# Patient Record
Sex: Female | Born: 1995 | Hispanic: No | Smoking: Former smoker
Health system: Southern US, Community
[De-identification: ages and names within clinical notes are randomized; demographics above are authoritative.]

## PROBLEM LIST (undated history)

## (undated) ENCOUNTER — Emergency Department: Discharge status: Absent without leave | Payer: Self-pay

## (undated) DIAGNOSIS — O2303 Infections of kidney in pregnancy, third trimester: Secondary | ICD-10-CM

## (undated) DIAGNOSIS — J189 Pneumonia, unspecified organism: Secondary | ICD-10-CM

## (undated) DIAGNOSIS — F419 Anxiety disorder, unspecified: Secondary | ICD-10-CM

## (undated) DIAGNOSIS — G43909 Migraine, unspecified, not intractable, without status migrainosus: Secondary | ICD-10-CM

## (undated) DIAGNOSIS — F410 Panic disorder [episodic paroxysmal anxiety] without agoraphobia: Secondary | ICD-10-CM

## (undated) DIAGNOSIS — F909 Attention-deficit hyperactivity disorder, unspecified type: Secondary | ICD-10-CM

## (undated) DIAGNOSIS — D649 Anemia, unspecified: Secondary | ICD-10-CM

## (undated) DIAGNOSIS — F32A Depression, unspecified: Secondary | ICD-10-CM

## (undated) DIAGNOSIS — F329 Major depressive disorder, single episode, unspecified: Secondary | ICD-10-CM

## (undated) DIAGNOSIS — J45909 Unspecified asthma, uncomplicated: Secondary | ICD-10-CM

## (undated) DIAGNOSIS — Z789 Other specified health status: Secondary | ICD-10-CM

## (undated) HISTORY — PX: NO PAST SURGERIES: SHX2092

## (undated) HISTORY — DX: Anemia, unspecified: D64.9

## (undated) HISTORY — PX: STENT PLACE LEFT URETER (ARMC HX): HXRAD1254

---
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
## 2015-03-24 ENCOUNTER — Emergency Department
Admission: EM | Admit: 2015-03-24 | Discharge: 2015-03-24 | Disposition: A | Discharge status: Home / Self Care | Payer: Self-pay | Attending: Student | Admitting: Student

## 2015-03-24 DIAGNOSIS — F17200 Nicotine dependence, unspecified, uncomplicated: Secondary | ICD-10-CM | POA: Insufficient documentation

## 2015-03-24 DIAGNOSIS — B349 Viral infection, unspecified: Secondary | ICD-10-CM | POA: Insufficient documentation

## 2015-03-24 HISTORY — DX: Unspecified asthma, uncomplicated: J45.909

## 2015-03-24 MED ORDER — ONDANSETRON HCL 4 MG PO TABS: 4.0000 mg | ORAL_TABLET | Freq: Three times a day (TID) | ORAL | Status: DC | PRN

## 2015-03-24 MED ORDER — ALBUTEROL SULFATE (2.5 MG/3ML) 0.083% IN NEBU
2.5000 mg | INHALATION_SOLUTION | Freq: Once | RESPIRATORY_TRACT | Status: AC
  Administered 2015-03-24: 2.5 mg via RESPIRATORY_TRACT
  Filled 2015-03-24: qty 3

## 2015-03-24 MED ORDER — GUAIFENESIN-CODEINE 100-10 MG/5ML PO SOLN: 10.0000 mL | Freq: Three times a day (TID) | ORAL | Status: DC | PRN

## 2015-03-24 NOTE — Discharge Instructions (Signed)
Viral Infections °A viral infection can be caused by different types of viruses. Most viral infections are not serious and resolve on their own. However, some infections may cause severe symptoms and may lead to further complications. °SYMPTOMS °Viruses can frequently cause: °· Minor sore throat. °· Aches and pains. °· Headaches. °· Runny nose. °· Different types of rashes. °· Watery eyes. °· Tiredness. °· Cough. °· Loss of appetite. °· Gastrointestinal infections, resulting in nausea, vomiting, and diarrhea. °These symptoms do not respond to antibiotics because the infection is not caused by bacteria. However, you might catch a bacterial infection following the viral infection. This is sometimes called a "superinfection." Symptoms of such a bacterial infection may include: °· Worsening sore throat with pus and difficulty swallowing. °· Swollen neck glands. °· Chills and a high or persistent fever. °· Severe headache. °· Tenderness over the sinuses. °· Persistent overall ill feeling (malaise), muscle aches, and tiredness (fatigue). °· Persistent cough. °· Yellow, green, or brown mucus production with coughing. °HOME CARE INSTRUCTIONS  °· Only take over-the-counter or prescription medicines for pain, discomfort, diarrhea, or fever as directed by your caregiver. °· Drink enough water and fluids to keep your urine clear or pale yellow. Sports drinks can provide valuable electrolytes, sugars, and hydration. °· Get plenty of rest and maintain proper nutrition. Soups and broths with crackers or rice are fine. °SEEK IMMEDIATE MEDICAL CARE IF:  °· You have severe headaches, shortness of breath, chest pain, neck pain, or an unusual rash. °· You have uncontrolled vomiting, diarrhea, or you are unable to keep down fluids. °· You or your child has an oral temperature above 102° F (38.9° C), not controlled by medicine. °· Your baby is older than 3 months with a rectal temperature of 102° F (38.9° C) or higher. °· Your baby is 3  months old or younger with a rectal temperature of 100.4° F (38° C) or higher. °MAKE SURE YOU:  °· Understand these instructions. °· Will watch your condition. °· Will get help right away if you are not doing well or get worse. °  °This information is not intended to replace advice given to you by your health care provider. Make sure you discuss any questions you have with your health care provider. °  °Document Released: 01/13/2005 Document Revised: 06/28/2011 Document Reviewed: 09/11/2014 °Elsevier Interactive Patient Education ©2016 [employer] ° °

## 2015-03-24 NOTE — ED Notes (Addendum)
Patient comes in with nausea and vomiting for 2 days.  Patient reports that boyfriend just recently got over stomach virus and now patient having same symptoms. Patient also complains of congestion and pain in chest with coughing.

## 2015-03-24 NOTE — ED Notes (Signed)
Assessment per PA 

## 2015-03-24 NOTE — ED Provider Notes (Signed)
[facility] Emergency Department Provider Note ____________________________________________  Time seen: Approximately 3:10 PM  I have reviewed the triage vital signs and the nursing notes.   HISTORY  Chief Complaint GI Problem and Cough   HPI Suzanne Nelson is a 19 y.o. female who presents to the emergency department with nausea and vomiting for the past 2 days and cough. She states that her boyfriend has had similar symptoms. She denies fever. She states that she has been using her inhaler, but it has not been relieving her cough. She last vomited earlier today and has been able to tolerate fluids since. She denies diarrhea or abdominal pain.   Past Medical History  Diagnosis Date  . Asthma     There are no active problems to display for this patient.   History reviewed. No pertinent past surgical history.  Current Outpatient Rx  Name  Route  Sig  Dispense  Refill  . guaiFENesin-codeine 100-10 MG/5ML syrup   Oral   Take 10 mLs by mouth 3 (three) times daily as needed.   120 mL   0   . ondansetron (ZOFRAN) 4 MG tablet   Oral   Take 1 tablet (4 mg total) by mouth every 8 (eight) hours as needed for nausea or vomiting.   20 tablet   0     Allergies Review of patient's allergies indicates no known allergies.  History reviewed. No pertinent family history.  Social History Social History  Substance Use Topics  . Smoking status: Current Every Day Smoker  . Smokeless tobacco: None  . Alcohol Use: No    Review of Systems Constitutional: No fever/chills Eyes: No visual changes. ENT: No sore throat. Cardiovascular: Denies chest pain. Respiratory: Denies shortness of breath. Gastrointestinal: No abdominal pain.  Positive for nausea and vomiting.  No diarrhea.  No constipation. Genitourinary: Negative for dysuria. Musculoskeletal: Negative for back pain. Skin: Negative for rash. Neurological: Negative for headaches, focal weakness or  numbness.  10-point ROS otherwise negative.  ____________________________________________   PHYSICAL EXAM:  VITAL SIGNS: ED Triage Vitals  Enc Vitals Group     BP 03/24/15 1230 108/77 mmHg     Pulse Rate 03/24/15 1230 109     Resp 03/24/15 1230 18     Temp 03/24/15 1230 98.1 F (36.7 C)     Temp Source 03/24/15 1230 Oral     SpO2 03/24/15 1230 99 %     Weight 03/24/15 1230 160 lb (72.576 kg)     Height 03/24/15 1230  (1.575 m)     Head Cir --      Peak Flow --      Pain Score 03/24/15 1230 4     Pain Loc --      Pain Edu? --      Excl. in GC? --     Constitutional: Alert and oriented. Well appearing and in no acute distress. Eyes: Conjunctivae are normal. PERRL. EOMI. Head: Atraumatic. Nose: No congestion/rhinnorhea. Mouth/Throat: Mucous membranes are moist.  Oropharynx non-erythematous. Neck: No stridor.   Hematological/Lymphatic/Immunilogical: No cervical lymphadenopathy. Cardiovascular: Normal rate, regular rhythm. Grossly normal heart sounds.  Good peripheral circulation. Respiratory: Normal respiratory effort.  No retractions. Lungs CTAB. Gastrointestinal: Soft and nontender. No distention. No abdominal bruits. No CVA tenderness. Musculoskeletal: No lower extremity tenderness nor edema.  No joint effusions. Neurologic:  Normal speech and language. No gross focal neurologic deficits are appreciated. No gait instability. Skin:  Skin is warm, dry and intact. No rash  noted. Psychiatric: Mood and affect are normal. Speech and behavior are normal.  ____________________________________________   LABS (all labs ordered are listed, but only abnormal results are displayed)  Labs Reviewed - No data to display ____________________________________________  EKG   ____________________________________________  RADIOLOGY   ____________________________________________   PROCEDURES  Procedure(s) performed: None  Critical Care performed:  No  ____________________________________________   INITIAL IMPRESSION / ASSESSMENT AND PLAN / ED COURSE  Pertinent labs & imaging results that were available during my care of the patient were reviewed by me and considered in my medical decision making (see chart for details).  Patient was given prescription for Zofran and Robitussin-AC. She was advised to continue using her inhaler as prescribed. She was advised to follow-up with her primary care provider or return to the emergency department for symptoms that change or worsen if she is unable to schedule an appointment. ____________________________________________   FINAL CLINICAL IMPRESSION(S) / ED DIAGNOSES  Final diagnoses:  Viral syndrome      Chinita PesterCari B Jabar Krysiak, FNP 03/24/15 [id number]  Gayla DossEryka A Gayle, MD 03/25/15 [phone]

## 2015-03-28 ENCOUNTER — Encounter: Payer: Self-pay | Admitting: Emergency Medicine

## 2015-03-28 ENCOUNTER — Emergency Department: Payer: No Typology Code available for payment source

## 2015-03-28 ENCOUNTER — Emergency Department
Admission: EM | Admit: 2015-03-28 | Discharge: 2015-03-28 | Disposition: A | Discharge status: Home / Self Care | Payer: No Typology Code available for payment source | Attending: Emergency Medicine | Admitting: Emergency Medicine

## 2015-03-28 DIAGNOSIS — Y998 Other external cause status: Secondary | ICD-10-CM | POA: Insufficient documentation

## 2015-03-28 DIAGNOSIS — S3991XA Unspecified injury of abdomen, initial encounter: Secondary | ICD-10-CM | POA: Insufficient documentation

## 2015-03-28 DIAGNOSIS — F17200 Nicotine dependence, unspecified, uncomplicated: Secondary | ICD-10-CM | POA: Diagnosis not present

## 2015-03-28 DIAGNOSIS — Y9389 Activity, other specified: Secondary | ICD-10-CM | POA: Diagnosis not present

## 2015-03-28 DIAGNOSIS — S0990XA Unspecified injury of head, initial encounter: Secondary | ICD-10-CM | POA: Diagnosis not present

## 2015-03-28 DIAGNOSIS — S29001A Unspecified injury of muscle and tendon of front wall of thorax, initial encounter: Secondary | ICD-10-CM | POA: Insufficient documentation

## 2015-03-28 DIAGNOSIS — S299XXA Unspecified injury of thorax, initial encounter: Secondary | ICD-10-CM | POA: Diagnosis not present

## 2015-03-28 DIAGNOSIS — Y92410 Unspecified street and highway as the place of occurrence of the external cause: Secondary | ICD-10-CM | POA: Diagnosis not present

## 2015-03-28 DIAGNOSIS — Z3202 Encounter for pregnancy test, result negative: Secondary | ICD-10-CM | POA: Diagnosis not present

## 2015-03-28 DIAGNOSIS — R109 Unspecified abdominal pain: Secondary | ICD-10-CM

## 2015-03-28 DIAGNOSIS — R1030 Lower abdominal pain, unspecified: Secondary | ICD-10-CM

## 2015-03-28 LAB — URINALYSIS COMPLETE WITH MICROSCOPIC (ARMC ONLY)
BILIRUBIN URINE: NEGATIVE
GLUCOSE, UA: NEGATIVE mg/dL
Hgb urine dipstick: NEGATIVE
Nitrite: NEGATIVE
Protein, ur: NEGATIVE mg/dL
Specific Gravity, Urine: 1.012 (ref 1.005–1.030)
pH: 6 (ref 5.0–8.0)

## 2015-03-28 LAB — CBC
HEMATOCRIT: 44.4 % (ref 35.0–47.0)
HEMOGLOBIN: 15.2 g/dL (ref 12.0–16.0)
MCH: 29.7 pg (ref 26.0–34.0)
MCHC: 34.2 g/dL (ref 32.0–36.0)
MCV: 86.8 fL (ref 80.0–100.0)
Platelets: 391 10*3/uL (ref 150–440)
RBC: 5.11 MIL/uL (ref 3.80–5.20)
RDW: 13.1 % (ref 11.5–14.5)
WBC: 11.1 10*3/uL — ABNORMAL HIGH (ref 3.6–11.0)

## 2015-03-28 LAB — BASIC METABOLIC PANEL
Anion gap: 10 (ref 5–15)
BUN: 9 mg/dL (ref 6–20)
CHLORIDE: 103 mmol/L (ref 101–111)
CO2: 26 mmol/L (ref 22–32)
CREATININE: 0.58 mg/dL (ref 0.44–1.00)
Calcium: 9.7 mg/dL (ref 8.9–10.3)
GLUCOSE: 95 mg/dL (ref 65–99)
Potassium: 3.8 mmol/L (ref 3.5–5.1)
Sodium: 139 mmol/L (ref 135–145)

## 2015-03-28 LAB — POCT PREGNANCY, URINE: Preg Test, Ur: NEGATIVE

## 2015-03-28 MED ORDER — TRAMADOL HCL 50 MG PO TABS
50.0000 mg | ORAL_TABLET | Freq: Once | ORAL | Status: AC
  Administered 2015-03-28: 50 mg via ORAL
  Filled 2015-03-28: qty 1

## 2015-03-28 MED ORDER — IOHEXOL 240 MG/ML SOLN
25.0000 mL | Freq: Once | INTRAMUSCULAR | Status: AC | PRN
  Administered 2015-03-28: 25 mL via ORAL

## 2015-03-28 MED ORDER — SODIUM CHLORIDE 0.9 % IV BOLUS (SEPSIS)
1000.0000 mL | Freq: Once | INTRAVENOUS | Status: AC
  Administered 2015-03-28: 1000 mL via INTRAVENOUS

## 2015-03-28 MED ORDER — IOHEXOL 300 MG/ML  SOLN
100.0000 mL | Freq: Once | INTRAMUSCULAR | Status: AC | PRN
  Administered 2015-03-28: 100 mL via INTRAVENOUS

## 2015-03-28 NOTE — ED Notes (Signed)
Pt's family requesting warm blankets which are given.  Pt reports finishing contrast, CT notified.

## 2015-03-28 NOTE — ED Notes (Signed)
Per ED tech, poc pregnancy negative
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
## 2015-05-18 ENCOUNTER — Encounter: Payer: Self-pay | Admitting: Medical Oncology

## 2015-05-18 ENCOUNTER — Observation Stay
Admission: EM | Admit: 2015-05-18 | Discharge: 2015-05-20 | Disposition: A | Discharge status: Home / Self Care | Payer: BLUE CROSS/BLUE SHIELD | Attending: Internal Medicine | Admitting: Internal Medicine

## 2015-05-18 ENCOUNTER — Emergency Department: Payer: BLUE CROSS/BLUE SHIELD

## 2015-05-18 DIAGNOSIS — F329 Major depressive disorder, single episode, unspecified: Secondary | ICD-10-CM | POA: Diagnosis not present

## 2015-05-18 DIAGNOSIS — N201 Calculus of ureter: Secondary | ICD-10-CM

## 2015-05-18 DIAGNOSIS — F419 Anxiety disorder, unspecified: Secondary | ICD-10-CM | POA: Diagnosis not present

## 2015-05-18 DIAGNOSIS — Z7951 Long term (current) use of inhaled steroids: Secondary | ICD-10-CM | POA: Diagnosis not present

## 2015-05-18 DIAGNOSIS — D72829 Elevated white blood cell count, unspecified: Secondary | ICD-10-CM

## 2015-05-18 DIAGNOSIS — N390 Urinary tract infection, site not specified: Secondary | ICD-10-CM

## 2015-05-18 DIAGNOSIS — R1012 Left upper quadrant pain: Secondary | ICD-10-CM | POA: Diagnosis not present

## 2015-05-18 DIAGNOSIS — R109 Unspecified abdominal pain: Secondary | ICD-10-CM | POA: Insufficient documentation

## 2015-05-18 DIAGNOSIS — Z833 Family history of diabetes mellitus: Secondary | ICD-10-CM | POA: Insufficient documentation

## 2015-05-18 DIAGNOSIS — J45909 Unspecified asthma, uncomplicated: Secondary | ICD-10-CM | POA: Insufficient documentation

## 2015-05-18 DIAGNOSIS — R1110 Vomiting, unspecified: Secondary | ICD-10-CM | POA: Insufficient documentation

## 2015-05-18 DIAGNOSIS — Z79899 Other long term (current) drug therapy: Secondary | ICD-10-CM | POA: Diagnosis not present

## 2015-05-18 DIAGNOSIS — N132 Hydronephrosis with renal and ureteral calculous obstruction: Secondary | ICD-10-CM | POA: Diagnosis not present

## 2015-05-18 DIAGNOSIS — R112 Nausea with vomiting, unspecified: Secondary | ICD-10-CM

## 2015-05-18 DIAGNOSIS — N1330 Unspecified hydronephrosis: Secondary | ICD-10-CM | POA: Diagnosis present

## 2015-05-18 HISTORY — DX: Anxiety disorder, unspecified: F41.9

## 2015-05-18 HISTORY — DX: Calculus of ureter: N20.1

## 2015-05-18 HISTORY — DX: Major depressive disorder, single episode, unspecified: F32.9

## 2015-05-18 HISTORY — DX: Depression, unspecified: F32.A

## 2015-05-18 HISTORY — DX: Unspecified hydronephrosis: N13.30

## 2015-05-18 LAB — URINALYSIS COMPLETE WITH MICROSCOPIC (ARMC ONLY)
BACTERIA UA: NONE SEEN
Bilirubin Urine: NEGATIVE
Glucose, UA: NEGATIVE mg/dL
Leukocytes, UA: NEGATIVE
Nitrite: POSITIVE — AB
PH: 6 (ref 5.0–8.0)
PROTEIN: NEGATIVE mg/dL
Specific Gravity, Urine: 1.021 (ref 1.005–1.030)

## 2015-05-18 LAB — COMPREHENSIVE METABOLIC PANEL
ALBUMIN: 4.9 g/dL (ref 3.5–5.0)
ALT: 24 U/L (ref 14–54)
ANION GAP: 9 (ref 5–15)
AST: 27 U/L (ref 15–41)
Alkaline Phosphatase: 110 U/L (ref 38–126)
BILIRUBIN TOTAL: 1 mg/dL (ref 0.3–1.2)
BUN: 13 mg/dL (ref 6–20)
CHLORIDE: 105 mmol/L (ref 101–111)
CO2: 26 mmol/L (ref 22–32)
Calcium: 9.7 mg/dL (ref 8.9–10.3)
Creatinine, Ser: 1.15 mg/dL — ABNORMAL HIGH (ref 0.44–1.00)
GFR calc Af Amer: >60 mL/min (ref >60)
GLUCOSE: 114 mg/dL — AB (ref 65–99)
POTASSIUM: 3.9 mmol/L (ref 3.5–5.1)
Sodium: 140 mmol/L (ref 135–145)
TOTAL PROTEIN: 8.4 g/dL — AB (ref 6.5–8.1)

## 2015-05-18 LAB — CBC
HEMATOCRIT: 44.6 % (ref 35.0–47.0)
HEMOGLOBIN: 15.3 g/dL (ref 12.0–16.0)
MCH: 29.1 pg (ref 26.0–34.0)
MCHC: 34.2 g/dL (ref 32.0–36.0)
MCV: 84.9 fL (ref 80.0–100.0)
Platelets: 386 10*3/uL (ref 150–440)
RBC: 5.25 MIL/uL — ABNORMAL HIGH (ref 3.80–5.20)
RDW: 13.2 % (ref 11.5–14.5)
WBC: 24.2 10*3/uL — AB (ref 3.6–11.0)

## 2015-05-18 LAB — HCG, QUANTITATIVE, PREGNANCY: hCG, Beta Chain, Quant, S: <1 m[IU]/mL (ref <5)

## 2015-05-18 LAB — LIPASE, BLOOD: LIPASE: 17 U/L (ref 11–51)

## 2015-05-18 MED ORDER — KCL IN DEXTROSE-NACL 20-5-0.45 MEQ/L-%-% IV SOLN
INTRAVENOUS | Status: DC
  Administered 2015-05-18 – 2015-05-20 (×4): via INTRAVENOUS
  Filled 2015-05-18 (×6): qty 1000

## 2015-05-18 MED ORDER — SODIUM CHLORIDE 0.9 % IV SOLN: INTRAVENOUS | Status: DC

## 2015-05-18 MED ORDER — DEXTROSE 5 % IV SOLN: 1.0000 g | INTRAVENOUS | Status: DC

## 2015-05-18 MED ORDER — KETOROLAC TROMETHAMINE 30 MG/ML IJ SOLN
30.0000 mg | Freq: Once | INTRAMUSCULAR | Status: AC
  Administered 2015-05-18: 30 mg via INTRAVENOUS
  Filled 2015-05-18: qty 1

## 2015-05-18 MED ORDER — ACETAMINOPHEN 650 MG RE SUPP
650.0000 mg | Freq: Four times a day (QID) | RECTAL | Status: DC | PRN
Start: 2015-05-18 — End: 2015-05-20
  Filled 2015-05-18: qty 1

## 2015-05-18 MED ORDER — BISACODYL 10 MG RE SUPP
10.0000 mg | Freq: Every day | RECTAL | Status: DC | PRN
  Filled 2015-05-18: qty 1

## 2015-05-18 MED ORDER — ONDANSETRON 4 MG PO TBDP
4.0000 mg | ORAL_TABLET | Freq: Once | ORAL | Status: AC | PRN
Start: 2015-05-18 — End: 2015-05-18
  Administered 2015-05-18: 4 mg via ORAL
  Filled 2015-05-18: qty 1

## 2015-05-18 MED ORDER — ONDANSETRON HCL 4 MG/2ML IJ SOLN
4.0000 mg | Freq: Four times a day (QID) | INTRAMUSCULAR | Status: DC | PRN
  Administered 2015-05-18 – 2015-05-20 (×4): 4 mg via INTRAVENOUS
  Filled 2015-05-18 (×4): qty 2

## 2015-05-18 MED ORDER — DIPHENHYDRAMINE HCL 50 MG/ML IJ SOLN: 12.5000 mg | Freq: Four times a day (QID) | INTRAMUSCULAR | Status: DC | PRN

## 2015-05-18 MED ORDER — ONDANSETRON HCL 4 MG/2ML IJ SOLN
4.0000 mg | Freq: Once | INTRAMUSCULAR | Status: AC
  Administered 2015-05-18: 4 mg via INTRAVENOUS
  Filled 2015-05-18: qty 2

## 2015-05-18 MED ORDER — DEXTROSE 5 % IV SOLN
1.0000 g | Freq: Once | INTRAVENOUS | Status: AC
  Administered 2015-05-18: 1 g via INTRAVENOUS
  Filled 2015-05-18: qty 10

## 2015-05-18 MED ORDER — ACETAMINOPHEN 325 MG PO TABS: 650.0000 mg | ORAL_TABLET | ORAL | Status: DC | PRN

## 2015-05-18 MED ORDER — ONDANSETRON HCL 4 MG/2ML IJ SOLN
4.0000 mg | Freq: Once | INTRAMUSCULAR | Status: AC
Start: 2015-05-18 — End: 2015-05-18
  Administered 2015-05-18: 4 mg via INTRAVENOUS
  Filled 2015-05-18: qty 2

## 2015-05-18 MED ORDER — ALBUTEROL SULFATE HFA 108 (90 BASE) MCG/ACT IN AERS: 2.0000 | INHALATION_SPRAY | Freq: Four times a day (QID) | RESPIRATORY_TRACT | Status: DC | PRN

## 2015-05-18 MED ORDER — ONDANSETRON HCL 4 MG/2ML IJ SOLN: 4.0000 mg | INTRAMUSCULAR | Status: DC | PRN

## 2015-05-18 MED ORDER — ACETAMINOPHEN 325 MG PO TABS: 650.0000 mg | ORAL_TABLET | Freq: Four times a day (QID) | ORAL | Status: DC | PRN

## 2015-05-18 MED ORDER — DEXTROSE 5 % IV SOLN
1.0000 g | INTRAVENOUS | Status: DC
  Administered 2015-05-19: 1 g via INTRAVENOUS
  Filled 2015-05-18 (×3): qty 10

## 2015-05-18 MED ORDER — IOHEXOL 240 MG/ML SOLN
25.0000 mL | Freq: Once | INTRAMUSCULAR | Status: AC | PRN
  Administered 2015-05-18: 25 mL via ORAL

## 2015-05-18 MED ORDER — MORPHINE SULFATE (PF) 4 MG/ML IV SOLN
4.0000 mg | Freq: Once | INTRAVENOUS | Status: AC
  Administered 2015-05-18: 4 mg via INTRAVENOUS
  Filled 2015-05-18: qty 1

## 2015-05-18 MED ORDER — CYCLOBENZAPRINE HCL 10 MG PO TABS
5.0000 mg | ORAL_TABLET | Freq: Every evening | ORAL | Status: DC | PRN
  Administered 2015-05-19: 5 mg via ORAL
  Filled 2015-05-18: qty 1

## 2015-05-18 MED ORDER — HYDROCODONE-ACETAMINOPHEN 5-325 MG PO TABS
1.0000 | ORAL_TABLET | ORAL | Status: DC | PRN
  Administered 2015-05-20: 1 via ORAL
  Administered 2015-05-20: 2 via ORAL
  Filled 2015-05-18: qty 2
  Filled 2015-05-18: qty 1

## 2015-05-18 MED ORDER — HYDROMORPHONE HCL 1 MG/ML IJ SOLN: 0.5000 mg | INTRAMUSCULAR | Status: DC | PRN

## 2015-05-18 MED ORDER — IOHEXOL 300 MG/ML  SOLN
100.0000 mL | Freq: Once | INTRAMUSCULAR | Status: AC | PRN
  Administered 2015-05-18: 100 mL via INTRAVENOUS

## 2015-05-18 MED ORDER — ONDANSETRON HCL 4 MG PO TABS: 4.0000 mg | ORAL_TABLET | Freq: Four times a day (QID) | ORAL | Status: DC | PRN

## 2015-05-18 MED ORDER — MORPHINE SULFATE (PF) 2 MG/ML IV SOLN
2.0000 mg | INTRAVENOUS | Status: DC | PRN
  Administered 2015-05-18 – 2015-05-19 (×4): 2 mg via INTRAVENOUS
  Filled 2015-05-18 (×5): qty 1

## 2015-05-18 MED ORDER — DIPHENHYDRAMINE HCL 12.5 MG/5ML PO ELIX
12.5000 mg | ORAL_SOLUTION | Freq: Four times a day (QID) | ORAL | Status: DC | PRN
  Filled 2015-05-18: qty 10

## 2015-05-18 MED ORDER — NICOTINE 7 MG/24HR TD PT24: 21.0000 mg | MEDICATED_PATCH | Freq: Every day | TRANSDERMAL | Status: DC

## 2015-05-18 MED ORDER — NICOTINE 7 MG/24HR TD PT24
7.0000 mg | MEDICATED_PATCH | Freq: Every day | TRANSDERMAL | Status: DC
  Administered 2015-05-18 – 2015-05-19 (×2): 7 mg via TRANSDERMAL
  Filled 2015-05-18 (×3): qty 1

## 2015-05-18 MED ORDER — OXYCODONE HCL 5 MG PO TABS: 5.0000 mg | ORAL_TABLET | ORAL | Status: DC | PRN

## 2015-05-18 MED ORDER — ALBUTEROL SULFATE (2.5 MG/3ML) 0.083% IN NEBU: 2.5000 mg | INHALATION_SOLUTION | Freq: Four times a day (QID) | RESPIRATORY_TRACT | Status: DC | PRN

## 2015-05-18 MED ORDER — HEPARIN SODIUM (PORCINE) 5000 UNIT/ML IJ SOLN
5000.0000 [IU] | Freq: Three times a day (TID) | INTRAMUSCULAR | Status: DC
  Administered 2015-05-18 – 2015-05-19 (×3): 5000 [IU] via SUBCUTANEOUS
  Filled 2015-05-18 (×3): qty 1

## 2015-05-18 MED ORDER — SODIUM CHLORIDE 0.9 % IV BOLUS (SEPSIS)
1000.0000 mL | Freq: Once | INTRAVENOUS | Status: AC
  Administered 2015-05-18: 1000 mL via INTRAVENOUS

## 2015-05-18 MED ORDER — TAMSULOSIN HCL 0.4 MG PO CAPS
0.4000 mg | ORAL_CAPSULE | Freq: Every day | ORAL | Status: DC
  Filled 2015-05-18 (×2): qty 1

## 2015-05-18 NOTE — Consult Note (Signed)
Subjective: I was asked to see Suzanne Nelson in consultation by Dr. Nigel Mormon for a 28m LUVJ stone with pain and nausea.  She has had some intermittent pain over the last week but it became severe about 2am.  A CT in the ER showed a 234mLUVJ stone with moderate hydronephrosis.   She has had no hematuria  and denies fever.  Her WBC count is 24K with a Cr up to 1.15 from 0.60 at baseline.   Her UA has TNTC RBC with a few WBC and it is nitrite positive but she had some bladder area pain and irritation and has taken OTC AZO which will give a false positive on the nitrite.   She has no prior stones or GU history. .  ROS:  Review of Systems  Constitutional: Negative for fever and chills.  Gastrointestinal: Positive for nausea, vomiting and abdominal pain.  Genitourinary: Positive for flank pain (left).  All other systems reviewed and are negative.   No Known Allergies  Past Medical History  Diagnosis Date  . Asthma   . Anxiety   . Depression     History reviewed. No pertinent past surgical history.  Social History   Social History  . Marital Status: Single    Spouse Name: N/A  . Number of Children: N/A  . Years of Education: N/A   Occupational History  . Not on file.   Social History Main Topics  . Smoking status: Current Every Day Smoker  . Smokeless tobacco: Not on file  . Alcohol Use: No  . Drug Use: No  . Sexual Activity: Not on file   Other Topics Concern  . Not on file   Social History Narrative   Lives at home, independant    Family History  Problem Relation Age of Onset  . Diabetes Mellitus II Mother   Thyroid disease   Mother.   Anti-infectives: Anti-infectives    Start     Dose/Rate Route Frequency Ordered Stop   05/18/15 1000  cefTRIAXone (ROCEPHIN) 1 g in dextrose 5 % 50 mL IVPB     1 g 100 mL/hr over 30 Minutes Intravenous  Once 05/18/15 0953 05/18/15 1122      Current Facility-Administered Medications  Medication Dose Route Frequency Provider Last  Rate Last Dose  . nicotine (NICODERM CQ - dosed in mg/24 hours) patch 21 mg  21 mg Transdermal Daily RaGladstone LighterMD       Current Outpatient Prescriptions  Medication Sig Dispense Refill  . albuterol (PROVENTIL HFA;VENTOLIN HFA) 108 (90 BASE) MCG/ACT inhaler Inhale 2 puffs into the lungs every 6 (six) hours as needed for wheezing or shortness of breath.    . cyclobenzaprine (FLEXERIL) 5 MG tablet Take 5 mg by mouth at bedtime as needed for muscle spasms.    . Marland KitchenuaiFENesin-codeine 100-10 MG/5ML syrup Take 10 mLs by mouth 3 (three) times daily as needed. 120 mL 0  . ondansetron (ZOFRAN) 4 MG tablet Take 1 tablet (4 mg total) by mouth every 8 (eight) hours as needed for nausea or vomiting. 20 tablet 0   Past, family and social history reviewed and updated.   Objective: Vital signs in last 24 hours: Temp:  [97.5 F (36.4 C)] 97.5 F (36.4 C) (01/29 0735) Pulse Rate:  [79-97] 97 (01/29 1139) Resp:  [16-18] 16 (01/29 1139) BP: (112-130)/(78-98) 112/78 mmHg (01/29 1139) SpO2:  [92 %-100 %] 99 % (01/29 1139) Weight:  [76.658 kg (169 lb)] 76.658 kg (169 lb) (01/29 0735)  Intake/Output from previous day:   Intake/Output this shift: Total I/O In: -  Out: 2 [Emesis/NG output:2]   Physical Exam  Constitutional: She is oriented to person, place, and time and well-developed, well-nourished, and in no distress.  She is currently comfortable with pain meds  HENT:  Head: Normocephalic and atraumatic.  Neck: Normal range of motion. Neck supple. No thyromegaly present.  Cardiovascular: Normal rate, regular rhythm and normal heart sounds.   Pulmonary/Chest: Effort normal and breath sounds normal. No respiratory distress.  Abdominal: Soft. She exhibits no distension and no mass. There is tenderness (LUQ and flank, mild. ). There is no rebound and no guarding.  Musculoskeletal: Normal range of motion. She exhibits no edema or tenderness.  Lymphadenopathy:    She has no cervical adenopathy.        Right: No inguinal and no supraclavicular adenopathy present.       Left: No inguinal and no supraclavicular adenopathy present.  Neurological: She is alert and oriented to person, place, and time.  Skin: Skin is warm and dry.  Psychiatric: Mood and affect normal.  Vitals reviewed.   Lab Results:   Recent Labs  05/18/15 0805  WBC 24.2*  HGB 15.3  HCT 44.6  PLT 386   BMET  Recent Labs  05/18/15 0805  NA 140  K 3.9  CL 105  CO2 26  GLUCOSE 114*  BUN 13  CREATININE 1.15*  CALCIUM 9.7   PT/INR No results for input(s): LABPROT, INR in the last 72 hours. ABG No results for input(s): PHART, HCO3 in the last 72 hours.  Invalid input(s): PCO2, PO2  Studies/Results: Ct Abdomen Pelvis W Contrast  05/18/2015  CLINICAL DATA:  20 year old with acute onset of a flank pain radiating into the lower abdomen which began yesterday, associated with nausea and vomiting. Leukocytosis with white blood count 24000. EXAM: CT ABDOMEN AND PELVIS WITH CONTRAST TECHNIQUE: Multidetector CT imaging of the abdomen and pelvis was performed using the standard protocol following bolus administration of intravenous contrast. CONTRAST:  171m OMNIPAQUE IOHEXOL 300 MG/ML IV. Oral contrast was also administered. COMPARISON:  03/28/2015, 04/10/2012, 10/31/2009. FINDINGS: Lower chest:  Heart size normal. Visualized lung bases clear. Hepatobiliary: Liver normal in size and appearance. Approximate 6 mm polyp in the posterior wall of the gallbladder fundus. No calcified gallstones. No biliary ductal dilation. Pancreas: Normal in appearance without evidence of mass, ductal dilation, or inflammation. Spleen: Normal in size and appearance. Focus of accessory splenic tissue posterior to the lower pole. Adrenals/Urinary Tract: Normal appearing adrenal glands. Mild left hydronephrosis and delayed excretion of contrast by the left kidney related to a 2 mm obstructing calculus at the left UVJ, best seen on the coronal  images. No urinary tract calculi elsewhere on either side. Moderate perinephric edema surrounding the left kidney. Normal-appearing right kidney. Normal-appearing urinary bladder. Stomach/Bowel: Stomach normal in appearance for the degree of distention. Normal-appearing small bowel. Normal appearing colon with expected stool burden. Mobile cecum present in the right upper quadrant. Normal-appearing long appendix in the right mid abdomen, extending almost to the midline. Vascular/Lymphatic: No visible aortoiliofemoral atherosclerosis. Widely patent visceral arteries. Normal-appearing vascular systems. Widely patent visceral arteries. No pathologic lymphadenopathy. Reproductive: Normal-appearing uterus and ovaries without evidence of adnexal mass. Other: None. Musculoskeletal: Regional skeleton intact without acute or significant osseous abnormality. IMPRESSION: Obstructing 2 mm calculus at the left UVJ. Electronically Signed   By: TEvangeline DakinM.D.   On: 05/18/2015 12:19   CT films and report and labs reviewed.  Case discussed with Dr. Nigel Mormon.   Assessment: She has a symptomatic 53m LUVJ stone with leukocytosis and ARI with no fever or distress at this time.  Her UA was Nit+ but she had taken OTC phenazopyridine which gives a false positive and the urine otherwise doesn't look infected.   I discussed the options for therapy including discharge home with MET,  Observation overnight with MET and IV hydration or immediate ureteroscopy/stent insertion.  At this time I have recommended and she agrees to hospital observation with medical management.   I will keep her NPO post MN and if she continues to have problems in the morning, ureteroscopy can be reconsidered.    I will cover her with Rocephin and begin tamsulosin.      CC: Dr. KCandyce Churn    WIrine SealJ 05/18/2015 [phone]

## 2015-05-18 NOTE — ED Notes (Addendum)
Pt states she took muscle spasm medication thinking it was her chronic back pain but no relief. Pt also states she has been trying to get pregnant and has not had her cycle but home pregnancy tests have been negative.

## 2015-05-18 NOTE — ED Provider Notes (Signed)
[facility] Emergency Department Provider Note  Time seen: 8:18 AM  I have reviewed the triage vital signs and the nursing notes.   HISTORY  Chief Complaint Flank Pain; Abdominal Pain; and Emesis    HPI Suzanne Nelson is a 20 y.o. female with a past medical history of asthma who presents the emergency department with back pain radiating to her left flank. According to the patient she has a history of chronic back pain and spasms. Her back pain began yesterday she thought it was just her normal back pain and spasms so she took a muscle relaxer. States no relief with the muscle relaxer, pain worsened around 8 PM last night radiating to her left upper flank. She also became nauseated with vomiting. Denies diarrhea, dysuria, vaginal bleeding or discharge. Denies fever. States her last period was in November but states is typical for her to have very abnormal periods. Describes her pain as moderate currently, nausea as severe.    Past Medical History  Diagnosis Date  . Asthma     There are no active problems to display for this patient.   History reviewed. No pertinent past surgical history.  Current Outpatient Rx  Name  Route  Sig  Dispense  Refill  . albuterol (PROVENTIL HFA;VENTOLIN HFA) 108 (90 BASE) MCG/ACT inhaler   Inhalation   Inhale 2 puffs into the lungs every 6 (six) hours as needed for wheezing or shortness of breath.         . guaiFENesin-codeine 100-10 MG/5ML syrup   Oral   Take 10 mLs by mouth 3 (three) times daily as needed.   120 mL   0   . ondansetron (ZOFRAN) 4 MG tablet   Oral   Take 1 tablet (4 mg total) by mouth every 8 (eight) hours as needed for nausea or vomiting.   20 tablet   0     Allergies Review of patient's allergies indicates no known allergies.  No family history on file.  Social History Social History  Substance Use Topics  . Smoking status: Current Every Day Smoker  . Smokeless tobacco: None  . Alcohol  Use: No    Review of Systems Constitutional: Negative for fever Cardiovascular: Negative for chest pain. Respiratory: Negative for shortness of breath. Gastrointestinal: Left flank pain. Positive for nausea and vomiting. Negative for diarrhea. Genitourinary: Negative for dysuria. Negative for hematuria. Negative for vaginal bleeding or discharge. Musculoskeletal: Positive for left upper back pain. Neurological: Negative for headache 10-point ROS otherwise negative.  ____________________________________________   PHYSICAL EXAM:  VITAL SIGNS: ED Triage Vitals  Enc Vitals Group     BP 05/18/15 0735 124/89 mmHg     Pulse Rate 05/18/15 0735 79     Resp 05/18/15 0735 18     Temp 05/18/15 0735 97.5 F (36.4 C)     Temp Source 05/18/15 0735 Oral     SpO2 05/18/15 0735 97 %     Weight 05/18/15 0735 169 lb (76.658 kg)     Height 05/18/15 0735  (1.575 m)     Head Cir --      Peak Flow --      Pain Score 05/18/15 0735 9     Pain Loc --      Pain Edu? --      Excl. in GC? --     Constitutional: Alert and oriented. Well appearing and in no distress. Eyes: Normal exam ENT   Head: Normocephalic and atraumatic.  Mouth/Throat: Mucous membranes are moist. Cardiovascular: Normal rate, regular rhythm. No murmur Respiratory: Normal respiratory effort without tachypnea nor retractions. Breath sounds are clear and equal bilaterally. No wheezes/rales/rhonchi. Gastrointestinal: Soft, nontender. No distention. Moderate left CVA tenderness. Musculoskeletal: Nontender with normal range of motion in all extremities. Neurologic:  Normal speech and language. No gross focal neurologic deficits Skin:  Skin is warm, dry and intact.  Psychiatric: Mood and affect are normal. Speech and behavior are normal.  ____________________________________________    INITIAL IMPRESSION / ASSESSMENT AND PLAN / ED COURSE  Pertinent labs & imaging results that were available during my care of the  patient were reviewed by me and considered in my medical decision making (see chart for details).  Patient presents to the emergency department with left flank pain, back pain, nausea and vomiting. Patient has moderate CVA tenderness on exam, otherwise largely benign exam. We will check labs including urinalysis, and treat pain and nausea, IV hydrate well awaiting lab results.  Patient's labs show significant leukocytosis of 24,000. Urinalysis shows minimal white blood cells and bacteria but is nitrite positive which would be suspicious for urinary tract infection leading to pyelonephritis. Given the patient's significant leukocytosis persistent nausea and pain we'll proceed with a CT abdomen/pelvis to further evaluate and rule out other intra-abdominal pathology and hopefully confirm pyelonephritis. We will start the patient on IV Rocephin, urine culture has been sent.  CT shows 2 mm obstructing stone. Given her significant white count elevation, obstructing stone with hydronephrosis, and nitrite positive urine. I discussed the patient with Dr. Annabell Howells of urology who recommends IV antibiotics and admission to the hospital, he will see the patient this afternoon. ____________________________________________   FINAL CLINICAL IMPRESSION(S) / ED DIAGNOSES  Left flank pain Urinary tract infection Obstructing kidney stone  Minna Antis, MD 05/18/15 1308

## 2015-05-18 NOTE — Plan of Care (Signed)
Problem: Nutrition: Goal: Adequate nutrition will be maintained Outcome: Not Progressing nausea

## 2015-05-18 NOTE — ED Notes (Signed)
Pt reports yesterday she began having left flank pain with radiation of pain into lower abdomen, pt reports nausea and vomiting since 8pm last night.

## 2015-05-18 NOTE — H&P (Addendum)
[facility] [place] at [facility]   PATIENT NAME: Suzanne Nelson    MR#:  [id number]  DATE OF BIRTH:  10/21/1995  DATE OF ADMISSION:  05/18/2015  PRIMARY CARE PHYSICIAN: [facility]   REQUESTING/REFERRING PHYSICIAN: Dr. Minna Antis  CHIEF COMPLAINT:   Chief Complaint  Patient presents with  . Flank Pain  . Abdominal Pain  . Emesis    HISTORY OF PRESENT ILLNESS:  Suzanne Nelson  is a 20 y.o. female with a known history of asthma, menstrual irregularities, anxiety presents to the hospital secondary to worsening left flank pain that started yesterday afternoon. Patient denies any prior history of kidney stones. It started with significant left flank pain and also costovertebral angle tenderness yesterday afternoon associated with nausea and vomiting. She thought it was a muscle spasm as she has some muscle spasms normally and took some muscle relaxant. That did not improve. She didn't have anybody to drive her to the hospital so she suffered with pain all night long since he was not improving presented to the hospital today. CT of the abdomen showed 2 mm obstructing stone in the left UV junction with minimal hydronephrosis on the left side. Urology consult is pending. Also complains of inability to void. Denies any fevers, but complains of chills. Urine showing UTI findings.  PAST MEDICAL HISTORY:   Past Medical History  Diagnosis Date  . Asthma   . Anxiety   . Depression     PAST SURGICAL HISTORY:  History reviewed. No pertinent past surgical history.  SOCIAL HISTORY:   Social History  Substance Use Topics  . Smoking status: Current Every Day Smoker  . Smokeless tobacco: Not on file  . Alcohol Use: No    FAMILY HISTORY:   Family History  Problem Relation Age of Onset  . Diabetes Mellitus II Mother     DRUG ALLERGIES:  No Known Allergies  REVIEW OF SYSTEMS:   Review of Systems  Constitutional: Positive for chills.  Negative for fever, weight loss and malaise/fatigue.  HENT: Negative for ear discharge, ear pain, hearing loss, nosebleeds and tinnitus.   Eyes: Negative for blurred vision, double vision and photophobia.  Respiratory: Negative for cough, hemoptysis, shortness of breath and wheezing.   Cardiovascular: Negative for chest pain, palpitations, orthopnea and leg swelling.  Gastrointestinal: Positive for nausea, vomiting and abdominal pain. Negative for heartburn, diarrhea, constipation and melena.  Genitourinary: Negative for dysuria, urgency and hematuria.       Decreased frequency of urination  Musculoskeletal: Positive for myalgias. Negative for back pain and neck pain.       Left flank pain  Skin: Negative for rash.  Neurological: Negative for dizziness, tingling, tremors, sensory change, speech change, focal weakness and headaches.  Endo/Heme/Allergies: Does not bruise/bleed easily.  Psychiatric/Behavioral: Negative for depression.    MEDICATIONS AT HOME:   Prior to Admission medications   Medication Sig Start Date End Date Taking? Authorizing Provider  albuterol (PROVENTIL HFA;VENTOLIN HFA) 108 (90 BASE) MCG/ACT inhaler Inhale 2 puffs into the lungs every 6 (six) hours as needed for wheezing or shortness of breath.   Yes Historical Provider, MD  cyclobenzaprine (FLEXERIL) 5 MG tablet Take 5 mg by mouth at bedtime as needed for muscle spasms.   Yes Historical Provider, MD  guaiFENesin-codeine 100-10 MG/5ML syrup Take 10 mLs by mouth 3 (three) times daily as needed. 03/24/15   Chinita Pester, FNP  ondansetron (ZOFRAN) 4 MG tablet Take 1 tablet (4 mg total)  by mouth every 8 (eight) hours as needed for nausea or vomiting. 03/24/15   Chinita Pester, FNP      VITAL SIGNS:  Blood pressure 112/78, pulse 97, temperature 97.5 F (36.4 C), temperature source Oral, resp. rate 16, height  (1.575 m), weight 76.658 kg (169 lb), last menstrual period 03/13/2015, SpO2 99 %.  PHYSICAL EXAMINATION:    Physical Exam  GENERAL:  20 y.o.-year-old patient lying in the bed with no acute distress. Received pain medicines and anti-emetics. EYES: Pupils equal, round, reactive to light and accommodation. No scleral icterus. Extraocular muscles intact.  HEENT: Head atraumatic, normocephalic. Oropharynx and nasopharynx clear.  NECK:  Supple, no jugular venous distention. No thyroid enlargement, no tenderness.  LUNGS: Normal breath sounds bilaterally, no wheezing, rales,rhonchi or crepitation. No use of accessory muscles of respiration.  CARDIOVASCULAR: S1, S2 normal. No murmurs, rubs, or gallops.  ABDOMEN: Soft, nondistended. Tenderness in the left costovertebral angle and also left upper quadrant. Bowel sounds present. No organomegaly or mass.  EXTREMITIES: No pedal edema, cyanosis, or clubbing.  NEUROLOGIC: Cranial nerves II through XII are intact. Muscle strength 5/5 in all extremities. Sensation intact. Gait not checked.  PSYCHIATRIC: The patient is alert and oriented x 3.  SKIN: No obvious rash, lesion, or ulcer.   LABORATORY PANEL:   CBC  Recent Labs Lab 05/18/15 0805  WBC 24.2*  HGB 15.3  HCT 44.6  PLT 386   ------------------------------------------------------------------------------------------------------------------  Chemistries   Recent Labs Lab 05/18/15 0805  NA 140  K 3.9  CL 105  CO2 26  GLUCOSE 114*  BUN 13  CREATININE 1.15*  CALCIUM 9.7  AST 27  ALT 24  ALKPHOS 110  BILITOT 1.0   ------------------------------------------------------------------------------------------------------------------  Cardiac Enzymes No results for input(s): TROPONINI in the last 168 hours. ------------------------------------------------------------------------------------------------------------------  RADIOLOGY:  Ct Abdomen Pelvis W Contrast  05/18/2015  CLINICAL DATA:  20 year old with acute onset of a flank pain radiating into the lower abdomen which began yesterday,  associated with nausea and vomiting. Leukocytosis with white blood count 24000. EXAM: CT ABDOMEN AND PELVIS WITH CONTRAST TECHNIQUE: Multidetector CT imaging of the abdomen and pelvis was performed using the standard protocol following bolus administration of intravenous contrast. CONTRAST:  OMNIPAQUE IOHEXOL 300 MG/ML IV. Oral contrast was also administered. COMPARISON:  03/28/2015, 04/10/2012, 10/31/2009. FINDINGS: Lower chest:  Heart size normal. Visualized lung bases clear. Hepatobiliary: Liver normal in size and appearance. Approximate 6 mm polyp in the posterior wall of the gallbladder fundus. No calcified gallstones. No biliary ductal dilation. Pancreas: Normal in appearance without evidence of mass, ductal dilation, or inflammation. Spleen: Normal in size and appearance. Focus of accessory splenic tissue posterior to the lower pole. Adrenals/Urinary Tract: Normal appearing adrenal glands. Mild left hydronephrosis and delayed excretion of contrast by the left kidney related to a 2 mm obstructing calculus at the left UVJ, best seen on the coronal images. No urinary tract calculi elsewhere on either side. Moderate perinephric edema surrounding the left kidney. Normal-appearing right kidney. Normal-appearing urinary bladder. Stomach/Bowel: Stomach normal in appearance for the degree of distention. Normal-appearing small bowel. Normal appearing colon with expected stool burden. Mobile cecum present in the right upper quadrant. Normal-appearing long appendix in the right mid abdomen, extending almost to the midline. Vascular/Lymphatic: No visible aortoiliofemoral atherosclerosis. Widely patent visceral arteries. Normal-appearing vascular systems. Widely patent visceral arteries. No pathologic lymphadenopathy. Reproductive: Normal-appearing uterus and ovaries without evidence of adnexal mass. Other: None. Musculoskeletal: Regional skeleton intact without acute or significant osseous  abnormality. IMPRESSION:  Obstructing 2 mm calculus at the left UVJ. Electronically Signed   By: Hulan Saas M.D.   On: 05/18/2015 12:19    EKG:   Orders placed or performed during the hospital encounter of 03/28/15  . EKG 12-Lead  . EKG 12-Lead    IMPRESSION AND PLAN:   Suzanne Nelson  is a 20 y.o. female with a known history of asthma, menstrual irregularities, anxiety presents to the hospital secondary to worsening left flank pain that started yesterday afternoon.  #1 nephrolithiasis-2 mm obstructing calculus at the left UVJ. Perinephric edema and mild left hydronephrosis noted on CT abdomen. -IV fluids, urine straining for any stones -IV antibiotics. Urology consulted. -Nothing by mouth for now until seen by urology. -IV pain medications and also antiemetics  #2 acute cystitis-follow-up urine cultures. Started on Rocephin for now. -  wbcs and nitrite positive but no bacteria seen - cont ABX for now Pregnancy test is negative. Known history of menstrual irregularities and last menstrual period was almost 2 months ago. Follows up with OB/GYN as an outpatient  #3 asthma-stable. Continue her when necessary Proventil inhaler  #4 tobacco use disorder-counseled against smoking for almost 3 minutes. Started on nicotine patch.  #5 DVT prophylaxis- on subcutaneous heparin, just in case if she needs any procedure    All the records are reviewed and case discussed with ED provider. Management plans discussed with the patient, family and they are in agreement.  CODE STATUS: Full code  TOTAL TIME TAKING CARE OF THIS PATIENT: 50 minutes.    Suzanne Nelson M.D on 05/18/2015 at 2:01 PM  Between 7am to 6pm - Pager - [phone]  After 6pm go to [email or web address] - password [email or web address] [facility]  [facility]  Office  [phone]  CC: Primary care physician; [facility]

## 2015-05-18 NOTE — Consult Note (Signed)
ANTIBIOTIC Renal adjustment consult  Pharmacy Consult for renally adjust antibiotic Indication: hydronephrosis  No Known Allergies  Patient Measurements: Height:  (157.5 cm) Weight: 169 lb (76.658 kg) IBW/kg (Calculated) : 50.1 Adjusted Body Weight:   Vital Signs: Temp: 97.5 F (36.4 C) (01/29 0735) Temp Source: Oral (01/29 0735) BP: 112/78 mmHg (01/29 1139) Pulse Rate: 97 (01/29 1139) Intake/Output from previous day:   Intake/Output from this shift: Total I/O In: -  Out: 2 [Emesis/NG output:2]  Labs:  Recent Labs  05/18/15 0805  WBC 24.2*  HGB 15.3  PLT 386  CREATININE 1.15*   Estimated Creatinine Clearance: 74.8 mL/min (by C-G formula based on Cr of 1.15). No results for input(s): VANCOTROUGH, VANCOPEAK, VANCORANDOM, GENTTROUGH, GENTPEAK, GENTRANDOM, TOBRATROUGH, TOBRAPEAK, TOBRARND, AMIKACINPEAK, AMIKACINTROU, AMIKACIN in the last 72 hours.   Microbiology: No results found for this or any previous visit (from the past 720 hour(s)).  Medical History: Past Medical History  Diagnosis Date  . Asthma   . Anxiety   . Depression     Medications:  Scheduled:  . nicotine  21 mg Transdermal Daily  . tamsulosin  0.4 mg Oral QPC supper   Assessment: Pt is a 20 year old female 2mm LUVJ stone with pain and nausea. She has had some intermittent pain over the last week but it became severe about 2am. A CT in the ER showed a 2mm LUVJ stone with moderate hydronephrosis. She has had no hematuria and denies fever. Her WBC count is 24K with a Cr up to 1.15 from 0.60 at baseline. Her UA has TNTC RBC with a few WBC and it is nitrite positive but she had some bladder area pain and irritation and has taken OTC AZO which will give a false positive on the nitrite. She has no prior stones or GU history. Currently being covered with ceftriaxone. Pharmacy consulted to renally adjust antibioitcs if needed.  Goal of Therapy:    Plan:  currently no adjustments are  needed. pharmacy will continue to monitor and adjust as needed. Thank you for the consult  Olene Floss 05/18/2015,4:11 PM

## 2015-05-19 ENCOUNTER — Encounter: Payer: Self-pay | Admitting: Anesthesiology

## 2015-05-19 ENCOUNTER — Observation Stay: Payer: BLUE CROSS/BLUE SHIELD | Admitting: Anesthesiology

## 2015-05-19 ENCOUNTER — Encounter
Admission: EM | Disposition: A | Discharge status: Home / Self Care | Payer: Self-pay | Source: Home / Self Care | Attending: Internal Medicine

## 2015-05-19 ENCOUNTER — Observation Stay: Payer: BLUE CROSS/BLUE SHIELD

## 2015-05-19 DIAGNOSIS — N201 Calculus of ureter: Secondary | ICD-10-CM | POA: Diagnosis not present

## 2015-05-19 HISTORY — PX: CYSTO: SHX6284

## 2015-05-19 HISTORY — PX: URETEROSCOPY WITH HOLMIUM LASER LITHOTRIPSY: SHX6645

## 2015-05-19 LAB — BASIC METABOLIC PANEL
Anion gap: 5 (ref 5–15)
BUN: 10 mg/dL (ref 6–20)
CALCIUM: 8.1 mg/dL — AB (ref 8.9–10.3)
CO2: 26 mmol/L (ref 22–32)
CREATININE: 1.07 mg/dL — AB (ref 0.44–1.00)
Chloride: 106 mmol/L (ref 101–111)
GFR calc Af Amer: >60 mL/min (ref >60)
Glucose, Bld: 109 mg/dL — ABNORMAL HIGH (ref 65–99)
POTASSIUM: 3.6 mmol/L (ref 3.5–5.1)
SODIUM: 137 mmol/L (ref 135–145)

## 2015-05-19 LAB — URINE CULTURE

## 2015-05-19 LAB — CBC
HEMATOCRIT: 37.3 % (ref 35.0–47.0)
Hemoglobin: 12.7 g/dL (ref 12.0–16.0)
MCH: 29.3 pg (ref 26.0–34.0)
MCHC: 33.9 g/dL (ref 32.0–36.0)
MCV: 86.2 fL (ref 80.0–100.0)
PLATELETS: 270 10*3/uL (ref 150–440)
RBC: 4.33 MIL/uL (ref 3.80–5.20)
RDW: 13.5 % (ref 11.5–14.5)
WBC: 11.7 10*3/uL — AB (ref 3.6–11.0)

## 2015-05-19 SURGERY — URETEROSCOPY, WITH LITHOTRIPSY USING HOLMIUM LASER
Anesthesia: General | Site: Urethra | Wound class: Clean

## 2015-05-19 MED ORDER — ONDANSETRON HCL 4 MG/2ML IJ SOLN
INTRAMUSCULAR | Status: DC | PRN
  Administered 2015-05-19: 4 mg via INTRAVENOUS

## 2015-05-19 MED ORDER — FENTANYL CITRATE (PF) 100 MCG/2ML IJ SOLN
INTRAMUSCULAR | Status: DC | PRN
  Administered 2015-05-19: 25 ug via INTRAVENOUS
  Administered 2015-05-19: 50 ug via INTRAVENOUS
  Administered 2015-05-19: 25 ug via INTRAVENOUS

## 2015-05-19 MED ORDER — PROMETHAZINE HCL 25 MG/ML IJ SOLN: 6.2500 mg | INTRAMUSCULAR | Status: DC | PRN

## 2015-05-19 MED ORDER — HYDROCODONE-ACETAMINOPHEN 5-325 MG PO TABS: 1.0000 | ORAL_TABLET | Freq: Four times a day (QID) | ORAL | Status: DC | PRN

## 2015-05-19 MED ORDER — DEXAMETHASONE SODIUM PHOSPHATE 10 MG/ML IJ SOLN
INTRAMUSCULAR | Status: DC | PRN
  Administered 2015-05-19: 10 mg via INTRAVENOUS

## 2015-05-19 MED ORDER — TAMSULOSIN HCL 0.4 MG PO CAPS: 0.4000 mg | ORAL_CAPSULE | Freq: Every day | ORAL | Status: DC

## 2015-05-19 MED ORDER — MIDAZOLAM HCL 2 MG/2ML IJ SOLN
INTRAMUSCULAR | Status: DC | PRN
  Administered 2015-05-19: 2 mg via INTRAVENOUS

## 2015-05-19 MED ORDER — LACTATED RINGERS IV SOLN
INTRAVENOUS | Status: DC | PRN
Start: 2015-05-19 — End: 2015-05-19
  Administered 2015-05-19: 18:00:00 via INTRAVENOUS

## 2015-05-19 MED ORDER — FENTANYL CITRATE (PF) 100 MCG/2ML IJ SOLN
25.0000 ug | INTRAMUSCULAR | Status: DC | PRN
  Administered 2015-05-19 (×2): 50 ug via INTRAVENOUS

## 2015-05-19 MED ORDER — MORPHINE SULFATE (PF) 2 MG/ML IV SOLN
2.0000 mg | Freq: Once | INTRAVENOUS | Status: AC
Start: 2015-05-19 — End: 2015-05-19
  Administered 2015-05-19: 2 mg via INTRAVENOUS

## 2015-05-19 MED ORDER — BELLADONNA ALKALOIDS-OPIUM 16.2-60 MG RE SUPP: 1.0000 | Freq: Three times a day (TID) | RECTAL | Status: DC | PRN

## 2015-05-19 MED ORDER — NALOXONE HCL 0.4 MG/ML IJ SOLN
INTRAMUSCULAR | Status: DC | PRN
  Administered 2015-05-19 (×2): 40 ug via INTRAVENOUS

## 2015-05-19 MED ORDER — HYOSCYAMINE SULFATE 0.125 MG SL SUBL
0.1250 mg | SUBLINGUAL_TABLET | SUBLINGUAL | Status: DC | PRN
  Administered 2015-05-20: 0.125 mg via SUBLINGUAL
  Filled 2015-05-19 (×2): qty 1

## 2015-05-19 MED ORDER — FENTANYL CITRATE (PF) 100 MCG/2ML IJ SOLN
INTRAMUSCULAR | Status: AC
  Filled 2015-05-19: qty 2

## 2015-05-19 MED ORDER — OXYBUTYNIN CHLORIDE 5 MG PO TABS: 5.0000 mg | ORAL_TABLET | Freq: Three times a day (TID) | ORAL | Status: DC | PRN

## 2015-05-19 SURGICAL SUPPLY — 30 items
ADAPTER SCOPE UROLOK II (MISCELLANEOUS) ×4 IMPLANT
ADHESIVE MASTISOL STRL (MISCELLANEOUS) ×4 IMPLANT
BAG DRAIN CYSTO-URO LG1000N (MISCELLANEOUS) ×4 IMPLANT
BASKET ZERO TIP 1.9FR (BASKET) ×4 IMPLANT
CATH URETL 5X70 OPEN END (CATHETERS) ×4 IMPLANT
CNTNR SPEC 2.5X3XGRAD LEK (MISCELLANEOUS) ×2
CONRAY 43 FOR UROLOGY 50M (MISCELLANEOUS) ×4 IMPLANT
CONT SPEC 4OZ STER OR WHT (MISCELLANEOUS) ×2
CONTAINER SPEC 2.5X3XGRAD LEK (MISCELLANEOUS) ×2 IMPLANT
DRSG TEGADERM 4X4.75 (GAUZE/BANDAGES/DRESSINGS) ×4 IMPLANT
GLOVE BIO SURGEON STRL SZ 6.5 (GLOVE) ×3 IMPLANT
GLOVE BIO SURGEON STRL SZ7 (GLOVE) ×8 IMPLANT
GLOVE BIO SURGEONS STRL SZ 6.5 (GLOVE) ×1
GOWN STRL REUS W/ TWL LRG LVL4 (GOWN DISPOSABLE) ×4 IMPLANT
GOWN STRL REUS W/TWL LRG LVL4 (GOWN DISPOSABLE) ×4
INTRODUCER DILATOR DOUBLE (INTRODUCER) ×4 IMPLANT
KIT RM TURNOVER CYSTO AR (KITS) ×4 IMPLANT
LASER FIBER 200M SMARTSCOPE (Laser) ×4 IMPLANT
PACK CYSTO AR (MISCELLANEOUS) ×4 IMPLANT
PREP PVP WINGED SPONGE (MISCELLANEOUS) ×4 IMPLANT
PUMP SINGLE ACTION SAP (PUMP) ×4 IMPLANT
SENSORWIRE 0.038 NOT ANGLED (WIRE) ×8
SET CYSTO W/LG BORE CLAMP LF (SET/KITS/TRAYS/PACK) ×4 IMPLANT
SHEATH URETERAL 12FRX35CM (MISCELLANEOUS) ×4 IMPLANT
SOL .9 NS 3000ML IRR  AL (IV SOLUTION) ×2
SOL .9 NS 3000ML IRR UROMATIC (IV SOLUTION) ×2 IMPLANT
STENT URET 6FRX24 CONTOUR (STENTS) ×4 IMPLANT
SURGILUBE 2OZ TUBE FLIPTOP (MISCELLANEOUS) ×4 IMPLANT
WATER STERILE IRR 1000ML POUR (IV SOLUTION) ×4 IMPLANT
WIRE SENSOR 0.038 NOT ANGLED (WIRE) ×4 IMPLANT

## 2015-05-19 NOTE — Progress Notes (Signed)
Urology Consult Follow Up  Subjective: Continues to have left lower quadrant pain. No nausea or vomiting. No fevers or chills. WBC improved today. KUB this morning shows presumed persistent stone.  Anti-infectives: Anti-infectives    Start     Dose/Rate Route Frequency Ordered Stop   05/19/15 1045  cefTRIAXone (ROCEPHIN) 1 g in dextrose 5 % 50 mL IVPB     1 g 100 mL/hr over 30 Minutes Intravenous Every 24 hours 05/18/15 1857     05/18/15 1515  cefTRIAXone (ROCEPHIN) 1 g in dextrose 5 % 50 mL IVPB  Status:  Discontinued     1 g 100 mL/hr over 30 Minutes Intravenous Every 24 hours 05/18/15 1511 05/18/15 1857   05/18/15 1000  cefTRIAXone (ROCEPHIN) 1 g in dextrose 5 % 50 mL IVPB     1 g 100 mL/hr over 30 Minutes Intravenous  Once 05/18/15 0953 05/18/15 1122      Current Facility-Administered Medications  Medication Dose Route Frequency Provider Last Rate Last Dose  . acetaminophen (TYLENOL) tablet 650 mg  650 mg Oral Q6H PRN Enid Baas, MD       Or  . acetaminophen (TYLENOL) suppository 650 mg  650 mg Rectal Q6H PRN Enid Baas, MD      . albuterol (PROVENTIL) (2.5 MG/3ML) 0.083% nebulizer solution 2.5 mg  2.5 mg Nebulization Q6H PRN Vanna Scotland, MD      . bisacodyl (DULCOLAX) suppository 10 mg  10 mg Rectal Daily PRN Bjorn Pippin, MD      . cefTRIAXone (ROCEPHIN) 1 g in dextrose 5 % 50 mL IVPB  1 g Intravenous Q24H Melissa D Maccia, RPH   1 g at 05/19/15 1119  . cyclobenzaprine (FLEXERIL) tablet 5 mg  5 mg Oral QHS PRN Enid Baas, MD      . dextrose 5 % and 0.45 % NaCl with KCl 20 mEq/L infusion   Intravenous Continuous Bjorn Pippin, MD 100 mL/hr at 05/19/15 0339    . diphenhydrAMINE (BENADRYL) injection 12.5-25 mg  12.5-25 mg Intravenous Q6H PRN Bjorn Pippin, MD       Or  . diphenhydrAMINE (BENADRYL) 12.5 MG/5ML elixir 12.5-25 mg  12.5-25 mg Oral Q6H PRN Bjorn Pippin, MD      . heparin injection 5,000 Units  5,000 Units Subcutaneous 3 times per day Enid Baas, MD    5,000 Units at 05/19/15 0524  . HYDROcodone-acetaminophen (NORCO/VICODIN) 5-325 MG per tablet 1-2 tablet  1-2 tablet Oral Q4H PRN Enid Baas, MD      . hyoscyamine (LEVSIN SL) SL tablet 0.125 mg  0.125 mg Sublingual Q4H PRN Bjorn Pippin, MD      . morphine 2 MG/ML injection 2 mg  2 mg Intravenous Q4H PRN Enid Baas, MD   2 mg at 05/19/15 0930  . nicotine (NICODERM CQ - dosed in mg/24 hr) patch 7 mg  7 mg Transdermal Daily Bjorn Pippin, MD   7 mg at 05/19/15 0930  . ondansetron (ZOFRAN) tablet 4 mg  4 mg Oral Q6H PRN Enid Baas, MD       Or  . ondansetron (ZOFRAN) injection 4 mg  4 mg Intravenous Q6H PRN Enid Baas, MD   4 mg at 05/19/15 0940  . oxyCODONE (Oxy IR/ROXICODONE) immediate release tablet 5 mg  5 mg Oral Q4H PRN Bjorn Pippin, MD      . tamsulosin [facility]) capsule 0.4 mg  0.4 mg Oral QPC supper Bjorn Pippin, MD         Objective: Vital signs  in last 24 hours: Temp:  [97.6 F (36.4 C)-98.4 F (36.9 C)] 98.3 F (36.8 C) (01/30 1100) Pulse Rate:  [85-109] 86 (01/30 1100) Resp:  [16-20] 18 (01/30 1100) BP: (95-115)/(54-71) 111/68 mmHg (01/30 1100) SpO2:  [97 %-100 %] 97 % (01/30 1100)  Intake/Output from previous day: 01/29 0701 - 01/30 0700 In: 1177 [P.O.:240; I.V.:937] Out: 402 [Urine:400; Emesis/NG output:2] Intake/Output this shift: Total I/O In: -  Out: 225 [Urine:225]   Physical Exam  Constitutional: She is oriented to person, place, and time and well-developed, well-nourished, and in no distress.  HENT:  Head: Normocephalic and atraumatic.  Cardiovascular: Normal rate.   Pulmonary/Chest: Effort normal and breath sounds normal.  Abdominal: Soft.  Mild LLQ pain  Musculoskeletal: Normal range of motion.  Neurological: She is alert and oriented to person, place, and time.  Skin: Skin is warm and dry.  Psychiatric: Affect normal.    Lab Results:   Recent Labs  05/18/15 0805 05/19/15 0509  WBC 24.2* 11.7*  HGB 15.3 12.7  HCT 44.6  37.3  PLT 386 270   BMET  Recent Labs  05/18/15 0805 05/19/15 0509  NA 140 137  K 3.9 3.6  CL 105 106  CO2 26 26  GLUCOSE 114* 109*  BUN 13 10  CREATININE 1.15* 1.07*  CALCIUM 9.7 8.1*   Studies/Results: Dg Abd 1 View  05/19/2015  CLINICAL DATA:  Left-sided back pain since Saturday. Left-sided kidney stone. EXAM: ABDOMEN - 1 VIEW COMPARISON:  None. FINDINGS: Bowel gas pattern is nonobstructive. There is persistent contrast within the left ureter and left renal pelvis, consistent with obstruction caused by the 2 mm stone seen on yesterday's CT. Osseous structures are unremarkable. IMPRESSION: Persistent contrast within the left-sided collecting system (throughout the left ureter and within the distended left renal pelvis), consistent with obstruction caused by the 2 mm stone seen on yesterday's CT. This persistent contrast suggests that the obstructing stone is still present at the left UVJ. Electronically Signed   By: Bary Richard M.D.   On: 05/19/2015 08:03   Ct Abdomen Pelvis W Contrast  05/18/2015  CLINICAL DATA:  20 year old with acute onset of a flank pain radiating into the lower abdomen which began yesterday, associated with nausea and vomiting. Leukocytosis with white blood count 24000. EXAM: CT ABDOMEN AND PELVIS WITH CONTRAST TECHNIQUE: Multidetector CT imaging of the abdomen and pelvis was performed using the standard protocol following bolus administration of intravenous contrast. CONTRAST:  OMNIPAQUE IOHEXOL 300 MG/ML IV. Oral contrast was also administered. COMPARISON:  03/28/2015, 04/10/2012, 10/31/2009. FINDINGS: Lower chest:  Heart size normal. Visualized lung bases clear. Hepatobiliary: Liver normal in size and appearance. Approximate 6 mm polyp in the posterior wall of the gallbladder fundus. No calcified gallstones. No biliary ductal dilation. Pancreas: Normal in appearance without evidence of mass, ductal dilation, or inflammation. Spleen: Normal in size and  appearance. Focus of accessory splenic tissue posterior to the lower pole. Adrenals/Urinary Tract: Normal appearing adrenal glands. Mild left hydronephrosis and delayed excretion of contrast by the left kidney related to a 2 mm obstructing calculus at the left UVJ, best seen on the coronal images. No urinary tract calculi elsewhere on either side. Moderate perinephric edema surrounding the left kidney. Normal-appearing right kidney. Normal-appearing urinary bladder. Stomach/Bowel: Stomach normal in appearance for the degree of distention. Normal-appearing small bowel. Normal appearing colon with expected stool burden. Mobile cecum present in the right upper quadrant. Normal-appearing long appendix in the right mid abdomen, extending almost to  the midline. Vascular/Lymphatic: No visible aortoiliofemoral atherosclerosis. Widely patent visceral arteries. Normal-appearing vascular systems. Widely patent visceral arteries. No pathologic lymphadenopathy. Reproductive: Normal-appearing uterus and ovaries without evidence of adnexal mass. Other: None. Musculoskeletal: Regional skeleton intact without acute or significant osseous abnormality. IMPRESSION: Obstructing 2 mm calculus at the left UVJ. Electronically Signed   By: Hulan Saas M.D.   On: 05/18/2015 12:19     Assessment: 2 mm distal ureteral stone WBC improved Afebrile [place] L URS, LL, stent vs. Continued medical expulsive therapy- risk and benefits of procedure were explained in detail. She would like to proceed with ureteroscopy given her refractory pain.  Plan: Plan for add on procedure this afternoon     LOS: 1 day    Vanna Scotland 05/19/2015

## 2015-05-19 NOTE — Anesthesia Postprocedure Evaluation (Signed)
Anesthesia Post Note  Patient: Suzanne Nelson  Procedure(s) Performed: Procedure(s) (LRB): URETEROSCOPY WITH HOLMIUM LASER LITHOTRIPSY (Left) CYSTO (N/A)  Patient location during evaluation: PACU Anesthesia Type: General Level of consciousness: awake and alert Pain management: pain level controlled Vital Signs Assessment: post-procedure vital signs reviewed and stable Respiratory status: spontaneous breathing, nonlabored ventilation, respiratory function stable and patient connected to nasal cannula oxygen Cardiovascular status: blood pressure returned to baseline and stable Postop Assessment: no signs of nausea or vomiting Anesthetic complications: no    Last Vitals:  Filed Vitals:   05/19/15 1953 05/19/15 2103  BP: 101/65 102/66  Pulse:  79  Temp:  36.6 C  Resp: 18 18    Last Pain:  Filed Vitals:   05/19/15 2103  PainSc: 0-No pain                 Lenard Simmer

## 2015-05-19 NOTE — Progress Notes (Signed)
[facility] [place] at [facility]   PATIENT NAME: Suzanne Nelson    MR#:  [id number]  DATE OF BIRTH:  [birth year]  SUBJECTIVE:  CHIEF COMPLAINT:   Chief Complaint  Patient presents with  . Flank Pain  . Abdominal Pain  . Emesis  Still has left flank pain and nausea.  REVIEW OF SYSTEMS:    Review of Systems  Constitutional: Positive for malaise/fatigue. Negative for fever and chills.  HENT: Negative for sore throat.   Eyes: Negative for blurred vision, double vision and pain.  Respiratory: Negative for cough, hemoptysis, shortness of breath and wheezing.   Cardiovascular: Negative for chest pain, palpitations, orthopnea and leg swelling.  Gastrointestinal: Positive for nausea and abdominal pain. Negative for heartburn, vomiting, diarrhea and constipation.  Genitourinary: Negative for dysuria and hematuria.  Musculoskeletal: Negative for back pain and joint pain.  Skin: Negative for rash.  Neurological: Positive for weakness. Negative for sensory change, speech change, focal weakness and headaches.  Endo/Heme/Allergies: Does not bruise/bleed easily.  Psychiatric/Behavioral: Negative for depression. The patient is not nervous/anxious.       DRUG ALLERGIES:   Allergies  Allergen Reactions  . Adhesive [Tape] Other (See Comments)    VITALS:  Blood pressure 111/68, pulse 86, temperature 98.3 F (36.8 C), temperature source Oral, resp. rate 18, height  (1.575 m), weight 76.658 kg (169 lb), last menstrual period 03/13/2015, SpO2 97 %.  PHYSICAL EXAMINATION:   Physical Exam  GENERAL:  20 y.o.-year-old patient lying in the bed with no acute distress.  EYES: Pupils equal, round, reactive to light and accommodation. No scleral icterus. Extraocular muscles intact.  HEENT: Head atraumatic, normocephalic. Oropharynx and nasopharynx clear.  NECK:  Supple, no jugular venous distention. No thyroid enlargement, no tenderness.  LUNGS: Normal breath  sounds bilaterally, no wheezing, rales, rhonchi. No use of accessory muscles of respiration.  CARDIOVASCULAR: S1, S2 normal. No murmurs, rubs, or gallops.  ABDOMEN: Soft, tender, nondistended. Bowel sounds present. No organomegaly or mass.  EXTREMITIES: No cyanosis, clubbing or edema b/l.    PSYCHIATRIC: The patient is alert and oriented x 3.  SKIN: No obvious rash, lesion, or ulcer.    LABORATORY PANEL:   CBC  Recent Labs Lab 05/19/15 0509  WBC 11.7*  HGB 12.7  HCT 37.3  PLT 270   ------------------------------------------------------------------------------------------------------------------  Chemistries   Recent Labs Lab 05/18/15 0805 05/19/15 0509  NA 140 137  K 3.9 3.6  CL 105 106  CO2 26 26  GLUCOSE 114* 109*  BUN 13 10  CREATININE 1.15* 1.07*  CALCIUM 9.7 8.1*  AST 27  --   ALT 24  --   ALKPHOS 110  --   BILITOT 1.0  --    ------------------------------------------------------------------------------------------------------------------  Cardiac Enzymes No results for input(s): TROPONINI in the last 168 hours. ------------------------------------------------------------------------------------------------------------------  RADIOLOGY:  Dg Abd 1 View  05/19/2015  CLINICAL DATA:  Left-sided back pain since Saturday. Left-sided kidney stone. EXAM: ABDOMEN - 1 VIEW COMPARISON:  None. FINDINGS: Bowel gas pattern is nonobstructive. There is persistent contrast within the left ureter and left renal pelvis, consistent with obstruction caused by the 2 mm stone seen on yesterday's CT. Osseous structures are unremarkable. IMPRESSION: Persistent contrast within the left-sided collecting system (throughout the left ureter and within the distended left renal pelvis), consistent with obstruction caused by the 2 mm stone seen on yesterday's CT. This persistent contrast suggests that the obstructing stone is still present at the left UVJ. Electronically Signed  By: Bary Richard M.D.   On: 05/19/2015 08:03   Ct Abdomen Pelvis W Contrast  05/18/2015  CLINICAL DATA:  20 year old with acute onset of a flank pain radiating into the lower abdomen which began yesterday, associated with nausea and vomiting. Leukocytosis with white blood count 24000. EXAM: CT ABDOMEN AND PELVIS WITH CONTRAST TECHNIQUE: Multidetector CT imaging of the abdomen and pelvis was performed using the standard protocol following bolus administration of intravenous contrast. CONTRAST:  OMNIPAQUE IOHEXOL 300 MG/ML IV. Oral contrast was also administered. COMPARISON:  03/28/2015, 04/10/2012, 10/31/2009. FINDINGS: Lower chest:  Heart size normal. Visualized lung bases clear. Hepatobiliary: Liver normal in size and appearance. Approximate 6 mm polyp in the posterior wall of the gallbladder fundus. No calcified gallstones. No biliary ductal dilation. Pancreas: Normal in appearance without evidence of mass, ductal dilation, or inflammation. Spleen: Normal in size and appearance. Focus of accessory splenic tissue posterior to the lower pole. Adrenals/Urinary Tract: Normal appearing adrenal glands. Mild left hydronephrosis and delayed excretion of contrast by the left kidney related to a 2 mm obstructing calculus at the left UVJ, best seen on the coronal images. No urinary tract calculi elsewhere on either side. Moderate perinephric edema surrounding the left kidney. Normal-appearing right kidney. Normal-appearing urinary bladder. Stomach/Bowel: Stomach normal in appearance for the degree of distention. Normal-appearing small bowel. Normal appearing colon with expected stool burden. Mobile cecum present in the right upper quadrant. Normal-appearing long appendix in the right mid abdomen, extending almost to the midline. Vascular/Lymphatic: No visible aortoiliofemoral atherosclerosis. Widely patent visceral arteries. Normal-appearing vascular systems. Widely patent visceral arteries. No pathologic lymphadenopathy.  Reproductive: Normal-appearing uterus and ovaries without evidence of adnexal mass. Other: None. Musculoskeletal: Regional skeleton intact without acute or significant osseous abnormality. IMPRESSION: Obstructing 2 mm calculus at the left UVJ. Electronically Signed   By: Hulan Saas M.D.   On: 05/18/2015 12:19     ASSESSMENT AND PLAN:   Suzanne Nelson is a 20 y.o. female with a known history of asthma, menstrual irregularities, anxiety presents to the hospital secondary to worsening left flank pain that started yesterday afternoon.  #1 nephrolithiasis-2 mm obstructing calculus at the left UVJ. Perinephric edema and mild left hydronephrosis noted on CT abdomen. OR later today with urology. Likely d/c after that  #2 Acute cystitis-follow-up urine cultures. Started on Rocephin for now.  #3 asthma-stable. Continue her when necessary Proventil inhaler  #4 tobacco use disorder-counseled   All the records are reviewed and case discussed with Care Management/Social Workerr. Management plans discussed with the patient, family and they are in agreement.  DVT Prophylaxis: SCDs  TOTAL TIME TAKING CARE OF THIS PATIENT: 20 minutes.   POSSIBLE D/C IN 1-2 DAYS, DEPENDING ON CLINICAL CONDITION.   Milagros Loll R M.D on 05/19/2015 at 3:50 PM  Between 7am to 6pm - Pager - [phone]  After 6pm go to [email or web address] - password [email or web address] [facility]  [place]  Office  [phone]  CC: Primary care physician; [facility]    Note: This dictation was prepared with Dragon dictation along with smaller phrase technology. Any transcriptional errors that result from this process are unintentional.

## 2015-05-19 NOTE — Transfer of Care (Signed)
Immediate Anesthesia Transfer of Care Note  Patient: Suzanne Nelson  Procedure(s) Performed: Procedure(s): URETEROSCOPY WITH HOLMIUM LASER LITHOTRIPSY (Left) CYSTO (N/A)  Patient Location: PACU  Anesthesia Type:General  Level of Consciousness: awake, alert , oriented and patient cooperative  Airway & Oxygen Therapy: Patient Spontanous Breathing  Post-op Assessment: Report given to RN and Post -op Vital signs reviewed and stable  Post vital signs: Reviewed and stable  Last Vitals:  Filed Vitals:   05/19/15 1100 05/19/15 1500  BP: 111/68 123/75  Pulse: 86 81  Temp: 36.8 C 36.9 C  Resp: 18 18    Complications: No apparent anesthesia complications

## 2015-05-19 NOTE — Op Note (Signed)
Date of procedure: 05/19/2015  Preoperative diagnosis:  1. Left ureteral stone   Postoperative diagnosis:  1. Same as above   Procedure: 1. Left ureteroscopy 2. Basket extraction of Stone fragment 3. Left ureteral stent placement  Surgeon: Vanna Scotland, MD  Anesthesia: General  Complications: None  Intraoperative findings: Soft left distal ureteral stone/debris  EBL: Minimal  Specimens: None  Drains: 6 x 24 French double-J ureteral stent on left, string left in place  Indication: Suzanne Nelson is a 20 y.o. patient with 2 mm left distal ureteral stone with proximal hydroureteronephrosis refractory pain.  After reviewing the management options for treatment, she elected to proceed with the above surgical procedure(s). We have discussed the potential benefits and risks of the procedure, side effects of the proposed treatment, the likelihood of the patient achieving the goals of the procedure, and any potential problems that might occur during the procedure or recuperation. Informed consent has been obtained.  Description of procedure:  The patient was taken to the operating room and general anesthesia was induced.  The patient was placed in the dorsal lithotomy position, prepped and draped in the usual sterile fashion, and preoperative antibiotics were administered. A preoperative time-out was performed.   A rigid 21 French cystoscope was advanced per urethra into the bladder. Attention was turned to the left ureteral orifice was cannulated using a 5 [place] open-ended ureteral catheter. A sensor wire was then placed up to level of the kidney. Of note, the distal fragment was unable be seen on fluoroscopy. The sensor wire was snapped in place as a safety wire. A semirigid ureteroscope was then advanced alongside the wire up to the level of the stone. Of note the stone was somewhat amorphous and soft. A 1.9 French nitinol basket was used to extract all of the stone debris. Once the  ureter was completely clear to stone fragment, the scope was passed beyond this up to level of the iliacs and there was no residual fragments noted. The scope was then removed. A 6 x 24 French double-J ureteral stent on a string was advanced up to level of the kidney. The wire was partially withdrawn until coil was noted within the renal pelvis. The wire was then fully withdrawn and a coil was noted within the bladder. The bladder was then drained. The stent string was affixed to the patient's left inner thigh using Mastisol and Tegaderm. The patient was then cleaned and dried, reversed from anesthesia, and taken to the PACU in stable condition.  Plan: Patient may remove her own stent in 2 days. She may be discharged from the medical service per the medicine doctors which was communicated. Scripts including narcotic, ditropan, and Flomax placed on the patient's chart. She should follow-up in my office in 4 weeks with a renal ultrasound prior.  Vanna Scotland, M.D.

## 2015-05-19 NOTE — Anesthesia Procedure Notes (Signed)
Procedure Name: LMA Insertion Date/Time: 05/19/2015 6:11 PM Performed by: Omer Jack Pre-anesthesia Checklist: Patient identified, Patient being monitored, Timeout performed, Emergency Drugs available and Suction available Patient Re-evaluated:Patient Re-evaluated prior to inductionOxygen Delivery Method: Circle system utilized Preoxygenation: Pre-oxygenation with 100% oxygen Intubation Type: IV induction Ventilation: Mask ventilation without difficulty LMA: LMA inserted LMA Size: 3.5 Tube type: Oral Number of attempts: 1 Placement Confirmation: positive ETCO2 and breath sounds checked- equal and bilateral Tube secured with: Tape Dental Injury: Teeth and Oropharynx as per pre-operative assessment

## 2015-05-19 NOTE — Anesthesia Preprocedure Evaluation (Signed)
Anesthesia Evaluation  Patient identified by MRN, date of birth, ID band Patient awake    Reviewed: Allergy & Precautions, H&P , NPO status , Patient's Chart, lab work & pertinent test results, reviewed documented beta blocker date and time   History of Anesthesia Complications Negative for: history of anesthetic complications  Airway Mallampati: II  TM Distance: >3 FB Neck ROM: full    Dental no notable dental hx. (+) Teeth Intact   Pulmonary neg shortness of breath, asthma , neg sleep apnea, neg COPD, neg recent URI, Current Smoker,    Pulmonary exam normal breath sounds clear to auscultation       Cardiovascular Exercise Tolerance: Good negative cardio ROS Normal cardiovascular exam Rhythm:regular Rate:Normal     Neuro/Psych PSYCHIATRIC DISORDERS (Depression and anxiety) negative neurological ROS     GI/Hepatic Neg liver ROS, GERD  Poorly Controlled,  Endo/Other  negative endocrine ROS  Renal/GU Renal disease (kidney stones)  negative genitourinary   Musculoskeletal   Abdominal   Peds  Hematology negative hematology ROS (+)   Anesthesia Other Findings Past Medical History:   Asthma                                                       Anxiety                                                      Depression                                                   Reproductive/Obstetrics negative OB ROS                             Anesthesia Physical Anesthesia Plan  ASA: II  Anesthesia Plan: General   Post-op Pain Management:    Induction:   Airway Management Planned:   Additional Equipment:   Intra-op Plan:   Post-operative Plan:   Informed Consent: I have reviewed the patients History and Physical, chart, labs and discussed the procedure including the risks, benefits and alternatives for the proposed anesthesia with the patient or authorized representative who has indicated  his/her understanding and acceptance.   Dental Advisory Given  Plan Discussed with: Anesthesiologist, CRNA and Surgeon  Anesthesia Plan Comments:         Anesthesia Quick Evaluation

## 2015-05-20 ENCOUNTER — Encounter: Payer: Self-pay | Admitting: Urology

## 2015-05-20 DIAGNOSIS — N201 Calculus of ureter: Secondary | ICD-10-CM | POA: Diagnosis not present

## 2015-05-20 NOTE — Consult Note (Signed)
ANTIBIOTIC Renal adjustment consult  Pharmacy Consult for renally adjust antibiotic Indication: hydronephrosis  Allergies  Allergen Reactions  . Adhesive [Tape] Other (See Comments)    Patient Measurements: Height:  (157.5 cm) Weight: 169 lb (76.658 kg) IBW/kg (Calculated) : 50.1  Vital Signs: Temp: 97.7 F (36.5 C) (01/31 0347) Temp Source: Oral (01/31 0347) BP: 94/50 mmHg (01/31 0347) Pulse Rate: 88 (01/31 0347) Intake/Output from previous day: 01/30 0701 - 01/31 0700 In: 1740 [I.V.:1740] Out: 1875 [Urine:1875] Intake/Output from this shift:    Labs:  Recent Labs  05/18/15 0805 05/19/15 0509  WBC 24.2* 11.7*  HGB 15.3 12.7  PLT 386 270  CREATININE 1.15* 1.07*   Estimated Creatinine Clearance: 80.4 mL/min (by C-G formula based on Cr of 1.07).  Microbiology: Recent Results (from the past 720 hour(s))  Urine culture     Status: None   Collection Time: 05/18/15  8:05 AM  Result Value Ref Range Status   Specimen Description URINE, CLEAN CATCH  Final   Special Requests NONE  Final   Culture MULTIPLE SPECIES PRESENT, SUGGEST RECOLLECTION  Final   Report Status 05/19/2015 FINAL  Final    Medical History: Past Medical History  Diagnosis Date  . Asthma   . Anxiety   . Depression     Medications:  Scheduled:  . cefTRIAXone (ROCEPHIN)  IV  1 g Intravenous Q24H  . fentaNYL      . heparin  5,000 Units Subcutaneous 3 times per day  . nicotine  7 mg Transdermal Daily  . tamsulosin  0.4 mg Oral QPC supper   Assessment: Pt is a 20 year old female 2mm LUVJ stone with pain and nausea. She has had some intermittent pain over the last week but it became severe about 2am. A CT in the ER showed a 2mm LUVJ stone with moderate hydronephrosis. She has had no hematuria and denies fever. Her WBC count is 24K with a Cr up to 1.15 from 0.60 at baseline. Her UA has TNTC RBC with a few WBC and it is nitrite positive but she had some bladder area pain and irritation  and has taken OTC AZO which will give a false positive on the nitrite. She has no prior stones or GU history. Currently being covered with ceftriaxone. Pharmacy consulted to renally adjust antibioitcs if needed.  Plan:  currently no adjustments are needed. pharmacy will continue to monitor and adjust as needed. Thank you for the consult  Stormy Card, RPh Clinical Pharmacist 05/20/2015,7:16 AM

## 2015-05-20 NOTE — Discharge Instructions (Addendum)
You have a ureteral stent in place.  This is a tube that extends from your kidney to your bladder.  This may cause urinary bleeding, burning with urination, and urinary frequency.  Please call our office or present to the ED if you develop fevers >101 or pain which is not able to be controlled with oral pain medications.  You may be given either Flomax and/ or ditropan to help with bladder spasms and stent pain in addition to pain medications.    Your stent is attached to a string which can be removed in 48 hours.  Untape the string from your left inner thigh, pull gently, and remove in entirety.    [facility] [address] [place], [place] [address] [phone]   Regular diet  Regular activity

## 2015-05-20 NOTE — Progress Notes (Signed)
Pt discharged home.  Discharge instructions, prescriptions and follow up appointment given to and reviewed with pt.  Pt verbalized understanding.  Escorted by auxillary. 

## 2015-05-20 NOTE — Progress Notes (Signed)
Urology Consult Follow Up  Subjective: Patient is a 20 year old female who is s/p ureteroscopy, stone basketing with ureteral stent placement on 05/19/2015 with Dr. Apolinar Junes for a 2 mm left distal stone with proximal hydroureteronephrosis with refractory pain.  Her night was uneventful.  Her vital signs a stable.  She is afebrile.  Her UOP is good.  She is wanting to go home.  She drinks copious amounts of soda and I advised her to stop.    Anti-infectives: Anti-infectives    Start     Dose/Rate Route Frequency Ordered Stop   05/19/15 1045  cefTRIAXone (ROCEPHIN) 1 g in dextrose 5 % 50 mL IVPB     1 g 100 mL/hr over 30 Minutes Intravenous Every 24 hours 05/18/15 1857     05/18/15 1515  cefTRIAXone (ROCEPHIN) 1 g in dextrose 5 % 50 mL IVPB  Status:  Discontinued     1 g 100 mL/hr over 30 Minutes Intravenous Every 24 hours 05/18/15 1511 05/18/15 1857   05/18/15 1000  cefTRIAXone (ROCEPHIN) 1 g in dextrose 5 % 50 mL IVPB     1 g 100 mL/hr over 30 Minutes Intravenous  Once 05/18/15 0953 05/18/15 1122      Current Facility-Administered Medications  Medication Dose Route Frequency Provider Last Rate Last Dose  . acetaminophen (TYLENOL) tablet 650 mg  650 mg Oral Q6H PRN Enid Baas, MD       Or  . acetaminophen (TYLENOL) suppository 650 mg  650 mg Rectal Q6H PRN Enid Baas, MD      . albuterol (PROVENTIL) (2.5 MG/3ML) 0.083% nebulizer solution 2.5 mg  2.5 mg Nebulization Q6H PRN Vanna Scotland, MD      . bisacodyl (DULCOLAX) suppository 10 mg  10 mg Rectal Daily PRN Bjorn Pippin, MD      . cefTRIAXone (ROCEPHIN) 1 g in dextrose 5 % 50 mL IVPB  1 g Intravenous Q24H Melissa D Maccia, RPH   1 g at 05/19/15 1119  . cyclobenzaprine (FLEXERIL) tablet 5 mg  5 mg Oral QHS PRN Enid Baas, MD   5 mg at 05/19/15 2321  . dextrose 5 % and 0.45 % NaCl with KCl 20 mEq/L infusion   Intravenous Continuous Bjorn Pippin, MD 100 mL/hr at 05/20/15 0257    . diphenhydrAMINE (BENADRYL) injection  12.5-25 mg  12.5-25 mg Intravenous Q6H PRN Bjorn Pippin, MD       Or  . diphenhydrAMINE (BENADRYL) 12.5 MG/5ML elixir 12.5-25 mg  12.5-25 mg Oral Q6H PRN Bjorn Pippin, MD      . heparin injection 5,000 Units  5,000 Units Subcutaneous 3 times per day Enid Baas, MD   5,000 Units at 05/19/15 2148  . HYDROcodone-acetaminophen (NORCO/VICODIN) 5-325 MG per tablet 1-2 tablet  1-2 tablet Oral Q4H PRN Enid Baas, MD   1 tablet at 05/20/15 0714  . hyoscyamine (LEVSIN SL) SL tablet 0.125 mg  0.125 mg Sublingual Q4H PRN Bjorn Pippin, MD      . morphine 2 MG/ML injection 2 mg  2 mg Intravenous Q4H PRN Enid Baas, MD   2 mg at 05/19/15 1332  . nicotine (NICODERM CQ - dosed in mg/24 hr) patch 7 mg  7 mg Transdermal Daily Bjorn Pippin, MD   7 mg at 05/19/15 0930  . ondansetron (ZOFRAN) tablet 4 mg  4 mg Oral Q6H PRN Enid Baas, MD       Or  . ondansetron (ZOFRAN) injection 4 mg  4 mg Intravenous Q6H PRN Enid Baas,  MD   4 mg at 05/19/15 2344  . opium-belladonna (B&O SUPPRETTES) 16.2-60 MG suppository 1 suppository  1 suppository Rectal Q8H PRN Vanna Scotland, MD      . tamsulosin (FLOMAX) capsule 0.4 mg  0.4 mg Oral QPC supper Bjorn Pippin, MD         Objective: Vital signs in last 24 hours: Temp:  [97.5 F (36.4 C)-98.5 F (36.9 C)] 97.9 F (36.6 C) (01/31 0748) Pulse Rate:  [71-101] 71 (01/31 0748) Resp:  [15-18] 18 (01/31 0748) BP: (94-123)/(50-78) 107/67 mmHg (01/31 0748) SpO2:  [95 %-100 %] 98 % (01/31 0748)  Intake/Output from previous day: 01/30 0701 - 01/31 0700 In: 1740 [I.V.:1740] Out: 1875 [Urine:1875] Intake/Output this shift: Total I/O In: -  Out: 600 [Urine:600]   Physical Exam Constitutional: Well nourished. Alert and oriented, No acute distress. HEENT: [place] AT, moist mucus membranes. Trachea midline, no masses. Cardiovascular: No clubbing, cyanosis, or edema. Respiratory: Normal respiratory effort, no increased work of breathing. GI: Abdomen is  soft, non tender, non distended, no abdominal masses.  Skin: No rashes, bruises or suspicious lesions. Lymph: No cervical or inguinal adenopathy. Neurologic: Grossly intact, no focal deficits, moving all 4 extremities. Psychiatric: Normal mood and affect.  Lab Results:   Recent Labs  05/18/15 0805 05/19/15 0509  WBC 24.2* 11.7*  HGB 15.3 12.7  HCT 44.6 37.3  PLT 386 270   BMET  Recent Labs  05/18/15 0805 05/19/15 0509  NA 140 137  K 3.9 3.6  CL 105 106  CO2 26 26  GLUCOSE 114* 109*  BUN 13 10  CREATININE 1.15* 1.07*  CALCIUM 9.7 8.1*   PT/INR No results for input(s): LABPROT, INR in the last 72 hours. ABG No results for input(s): PHART, HCO3 in the last 72 hours.  Invalid input(s): PCO2, PO2  Studies/Results: Dg Abd 1 View  05/19/2015  CLINICAL DATA:  Left-sided back pain since Saturday. Left-sided kidney stone. EXAM: ABDOMEN - 1 VIEW COMPARISON:  None. FINDINGS: Bowel gas pattern is nonobstructive. There is persistent contrast within the left ureter and left renal pelvis, consistent with obstruction caused by the 2 mm stone seen on yesterday's CT. Osseous structures are unremarkable. IMPRESSION: Persistent contrast within the left-sided collecting system (throughout the left ureter and within the distended left renal pelvis), consistent with obstruction caused by the 2 mm stone seen on yesterday's CT. This persistent contrast suggests that the obstructing stone is still present at the left UVJ. Electronically Signed   By: Bary Richard M.D.   On: 05/19/2015 08:03   Ct Abdomen Pelvis W Contrast  05/18/2015  CLINICAL DATA:  20 year old with acute onset of a flank pain radiating into the lower abdomen which began yesterday, associated with nausea and vomiting. Leukocytosis with white blood count 24000. EXAM: CT ABDOMEN AND PELVIS WITH CONTRAST TECHNIQUE: Multidetector CT imaging of the abdomen and pelvis was performed using the standard protocol following bolus  administration of intravenous contrast. CONTRAST:  OMNIPAQUE IOHEXOL 300 MG/ML IV. Oral contrast was also administered. COMPARISON:  03/28/2015, 04/10/2012, 10/31/2009. FINDINGS: Lower chest:  Heart size normal. Visualized lung bases clear. Hepatobiliary: Liver normal in size and appearance. Approximate 6 mm polyp in the posterior wall of the gallbladder fundus. No calcified gallstones. No biliary ductal dilation. Pancreas: Normal in appearance without evidence of mass, ductal dilation, or inflammation. Spleen: Normal in size and appearance. Focus of accessory splenic tissue posterior to the lower pole. Adrenals/Urinary Tract: Normal appearing adrenal glands. Mild left hydronephrosis and delayed  excretion of contrast by the left kidney related to a 2 mm obstructing calculus at the left UVJ, best seen on the coronal images. No urinary tract calculi elsewhere on either side. Moderate perinephric edema surrounding the left kidney. Normal-appearing right kidney. Normal-appearing urinary bladder. Stomach/Bowel: Stomach normal in appearance for the degree of distention. Normal-appearing small bowel. Normal appearing colon with expected stool burden. Mobile cecum present in the right upper quadrant. Normal-appearing long appendix in the right mid abdomen, extending almost to the midline. Vascular/Lymphatic: No visible aortoiliofemoral atherosclerosis. Widely patent visceral arteries. Normal-appearing vascular systems. Widely patent visceral arteries. No pathologic lymphadenopathy. Reproductive: Normal-appearing uterus and ovaries without evidence of adnexal mass. Other: None. Musculoskeletal: Regional skeleton intact without acute or significant osseous abnormality. IMPRESSION: Obstructing 2 mm calculus at the left UVJ. Electronically Signed   By: Hulan Saas M.D.   On: 05/18/2015 12:19     Assessment: Patient is s/p URS with stone basketing and left ureteral stent placement on 05/19/2015 for a 2 mm left  distal ureteral stone causing hydroureteronephrosis with refractory pain.  Doing well.  Plan: Discharge Patient to be discharged to home.  Scripts for Flomax, hydrocodone/APAP and oxybutynin are in the chart.  She is to remove the ureteral stent in 48 hours.  Instructions given to the patient.  She is to follow up in 4 weeks with [facility]US and office visit.    CC: Dr. Elpidio Anis    LOS: 2 days    [facility] 05/20/2015

## 2015-05-21 NOTE — Discharge Summary (Signed)
[facility] - [place] at [facility]   PATIENT NAME: Suzanne Nelson    MR#:  [id number]  DATE OF BIRTH:  [birth year]  DATE OF ADMISSION:  05/18/2015 ADMITTING PHYSICIAN: Enid Baas, MD  DATE OF DISCHARGE: 05/20/2015  1:31 PM  PRIMARY CARE PHYSICIAN: [facility]    ADMISSION DIAGNOSIS:  Ureterolithiasis [N20.1] UTI (lower urinary tract infection) [N39.0]  DISCHARGE DIAGNOSIS:  Principal Problem:   Calculus of distal left ureter Active Problems:   Hydronephrosis   Left ureteral stone   SECONDARY DIAGNOSIS:   Past Medical History  Diagnosis Date  . Asthma   . Anxiety   . Depression      ADMITTING HISTORY  Suzanne Nelson is a 20 y.o. female with a known history of asthma, menstrual irregularities, anxiety presents to the hospital secondary to worsening left flank pain that started yesterday afternoon. Patient denies any prior history of kidney stones. It started with significant left flank pain and also costovertebral angle tenderness yesterday afternoon associated with nausea and vomiting. She thought it was a muscle spasm as she has some muscle spasms normally and took some muscle relaxant. That did not improve. She didn't have anybody to drive her to the hospital so she suffered with pain all night long since he was not improving presented to the hospital today. CT of the abdomen showed 2 mm obstructing stone in the left UV junction with minimal hydronephrosis on the left side. Urology consult is pending. Also complains of inability to void. Denies any fevers, but complains of chills. Urine showing UTI findings.   HOSPITAL COURSE:   Left UVJ stone - s/p ureteral stent. Pt was on IV abx during hospital stay. She was advised by urology to pull stent out after 2 days. Much improved with pain by day of discharge.  Flomax and pain meds given.  Discharged home in stable condition.   CONSULTS OBTAINED:  Treatment Team:  Bjorn Pippin, MD  DRUG ALLERGIES:   Allergies  Allergen Reactions  . Adhesive [Tape] Other (See Comments)    DISCHARGE MEDICATIONS:   Discharge Medication List as of 05/20/2015 11:34 AM    START taking these medications   Details  HYDROcodone-acetaminophen (NORCO/VICODIN) 5-325 MG tablet Take 1-2 tablets by mouth every 6 (six) hours as needed for moderate pain., Starting 05/19/2015, Until Discontinued, Print    oxybutynin (DITROPAN) 5 MG tablet Take 1 tablet (5 mg total) by mouth every 8 (eight) hours as needed for bladder spasms., Starting 05/19/2015, Until Discontinued, Print    tamsulosin (FLOMAX) 0.4 MG CAPS capsule Take 1 capsule (0.4 mg total) by mouth daily., Starting 05/19/2015, Until Discontinued, Print      CONTINUE these medications which have NOT CHANGED   Details  albuterol (PROVENTIL HFA;VENTOLIN HFA) 108 (90 BASE) MCG/ACT inhaler Inhale 2 puffs into the lungs every 6 (six) hours as needed for wheezing or shortness of breath., Until Discontinued, Historical Med    cyclobenzaprine (FLEXERIL) 5 MG tablet Take 5 mg by mouth at bedtime as needed for muscle spasms., Until Discontinued, Historical Med    guaiFENesin-codeine 100-10 MG/5ML syrup Take 10 mLs by mouth 3 (three) times daily as needed., Starting 03/24/2015, Until Discontinued, Print    ondansetron (ZOFRAN) 4 MG tablet Take 1 tablet (4 mg total) by mouth every 8 (eight) hours as needed for nausea or vomiting., Starting 03/24/2015, Until Discontinued, Print         Today    VITAL SIGNS:  Blood pressure 105/60, pulse  80, temperature 98.1 F (36.7 C), temperature source Oral, resp. rate 20, height  (1.575 m), weight 76.658 kg (169 lb), last menstrual period 03/13/2015, SpO2 98 %.  I/O:  No intake or output data in the 24 hours ending 05/21/15 2225  PHYSICAL EXAMINATION:  Physical Exam  GENERAL:  19 y.o.-year-old patient lying in the bed with no acute distress.  LUNGS: Normal breath sounds bilaterally, no  wheezing, rales,rhonchi or crepitation. No use of accessory muscles of respiration.  CARDIOVASCULAR: S1, S2 normal. No murmurs, rubs, or gallops.  ABDOMEN: Soft, non-tender, non-distended. Bowel sounds present. No organomegaly or mass.  NEUROLOGIC: Moves all 4 extremities. PSYCHIATRIC: The patient is alert and oriented x 3.  SKIN: No obvious rash, lesion, or ulcer.   DATA REVIEW:   CBC  Recent Labs Lab 05/19/15 0509  WBC 11.7*  HGB 12.7  HCT 37.3  PLT 270    Chemistries   Recent Labs Lab 05/18/15 0805 05/19/15 0509  NA 140 137  K 3.9 3.6  CL 105 106  CO2 26 26  GLUCOSE 114* 109*  BUN 13 10  CREATININE 1.15* 1.07*  CALCIUM 9.7 8.1*  AST 27  --   ALT 24  --   ALKPHOS 110  --   BILITOT 1.0  --     Cardiac Enzymes No results for input(s): TROPONINI in the last 168 hours.  Microbiology Results  Results for orders placed or performed during the hospital encounter of 05/18/15  Urine culture     Status: None   Collection Time: 05/18/15  8:05 AM  Result Value Ref Range Status   Specimen Description URINE, CLEAN CATCH  Final   Special Requests NONE  Final   Culture MULTIPLE SPECIES PRESENT, SUGGEST RECOLLECTION  Final   Report Status 05/19/2015 FINAL  Final    RADIOLOGY:  No results found.    Follow up with PCP in 1 week.  Management plans discussed with the patient, family and they are in agreement.  CODE STATUS:  Code Status History    Date Active Date Inactive Code Status Order ID Comments User Context   05/18/2015  6:32 PM 05/20/2015  4:31 PM Full Code [id number]  Enid Baas, MD Inpatient   05/18/2015  3:11 PM 05/18/2015  6:32 PM Full Code [id number]  Bjorn Pippin, MD ED      TOTAL TIME TAKING CARE OF THIS PATIENT ON DAY OF DISCHARGE: more than 30 minutes.    Milagros Loll R M.D on 05/21/2015 at 10:25 PM  Between 7am to 6pm - Pager - [phone]  After 6pm go to [email or web address] - password [email or web address] [facility]  [facility]  Office   [phone]  CC: Primary care physician; [facility]     Note: This dictation was prepared with Dragon dictation along with smaller phrase technology. Any transcriptional errors that result from this process are unintentional.

## 2015-06-11 ENCOUNTER — Ambulatory Visit: Payer: BLUE CROSS/BLUE SHIELD

## 2015-06-13 ENCOUNTER — Ambulatory Visit: Payer: No Typology Code available for payment source | Admitting: Urology

## 2015-06-16 ENCOUNTER — Ambulatory Visit: Payer: No Typology Code available for payment source

## 2015-12-06 ENCOUNTER — Encounter: Payer: Self-pay | Admitting: Emergency Medicine

## 2015-12-06 ENCOUNTER — Emergency Department
Admission: EM | Admit: 2015-12-06 | Discharge: 2015-12-06 | Disposition: A | Discharge status: Home / Self Care | Payer: No Typology Code available for payment source | Attending: Emergency Medicine | Admitting: Emergency Medicine

## 2015-12-06 DIAGNOSIS — N939 Abnormal uterine and vaginal bleeding, unspecified: Secondary | ICD-10-CM

## 2015-12-06 DIAGNOSIS — Z79899 Other long term (current) drug therapy: Secondary | ICD-10-CM | POA: Insufficient documentation

## 2015-12-06 DIAGNOSIS — J45909 Unspecified asthma, uncomplicated: Secondary | ICD-10-CM | POA: Insufficient documentation

## 2015-12-06 DIAGNOSIS — F17200 Nicotine dependence, unspecified, uncomplicated: Secondary | ICD-10-CM | POA: Insufficient documentation

## 2015-12-06 DIAGNOSIS — N9489 Other specified conditions associated with female genital organs and menstrual cycle: Secondary | ICD-10-CM | POA: Insufficient documentation

## 2015-12-06 DIAGNOSIS — N946 Dysmenorrhea, unspecified: Secondary | ICD-10-CM

## 2015-12-06 DIAGNOSIS — N926 Irregular menstruation, unspecified: Secondary | ICD-10-CM | POA: Insufficient documentation

## 2015-12-06 LAB — CBC
HEMATOCRIT: 43.8 % (ref 35.0–47.0)
Hemoglobin: 15.3 g/dL (ref 12.0–16.0)
MCH: 30.9 pg (ref 26.0–34.0)
MCHC: 34.9 g/dL (ref 32.0–36.0)
MCV: 88.6 fL (ref 80.0–100.0)
PLATELETS: 308 10*3/uL (ref 150–440)
RBC: 4.94 MIL/uL (ref 3.80–5.20)
RDW: 13.2 % (ref 11.5–14.5)
WBC: 10 10*3/uL (ref 3.6–11.0)

## 2015-12-06 LAB — HCG, QUANTITATIVE, PREGNANCY: HCG, BETA CHAIN, QUANT, S: 1 m[IU]/mL (ref <5)

## 2015-12-06 LAB — POCT PREGNANCY, URINE: PREG TEST UR: NEGATIVE

## 2015-12-06 MED ORDER — KETOROLAC TROMETHAMINE 30 MG/ML IJ SOLN
30.0000 mg | Freq: Once | INTRAMUSCULAR | Status: AC
  Administered 2015-12-06: 30 mg via INTRAMUSCULAR
  Filled 2015-12-06: qty 1

## 2015-12-06 NOTE — ED Triage Notes (Signed)
States vaginal bleeding and abdominal cramping began today.

## 2015-12-06 NOTE — ED Provider Notes (Signed)
[facility] Emergency Department Provider Note  ____________________________________________  Time seen: Approximately 3:50 PM  I have reviewed the triage vital signs and the nursing notes.   HISTORY  Chief Complaint Abdominal Pain   HPI Suzanne Nelson is a [age] y.o. female history of anxiety, depression, and asthma who presents for evaluation of abdominal pain. Patient reports that this morning she passed 2 large blood clots. She reports spotting in between the blood clots. She reports that she has had irregular menstrual periods since the age of 89 when she had her first period. She reports that her last menstrual period was a year ago. She does not take any birth control pills or IUD. She was seen by an OB/GYN a year ago however does not remember receiving a diagnosis. She also endorses suprapubic cramping, 8/10, intermittent, since this morning, nonradiating. She denies vaginal discharge, chest pain, shortness of breath, fever, chills, dysuria, hematuria, diarrhea. She hasn't taken anything for the pain. She also reports that she felt lightheaded while in the waiting room.  Past Medical History:  Diagnosis Date  . Anxiety   . Asthma   . Depression     Patient Active Problem List   Diagnosis Date Noted  . Hydronephrosis 05/18/2015  . Calculus of distal left ureter 05/18/2015  . Left ureteral stone 05/18/2015    Past Surgical History:  Procedure Laterality Date  . CYSTO N/A 05/19/2015   Procedure: CYSTO;  Surgeon: Vanna ScotlandAshley Brandon, MD;  Location: [facility] ORS;  Service: Urology;  Laterality: N/A;  . URETEROSCOPY WITH HOLMIUM LASER LITHOTRIPSY Left 05/19/2015   Procedure: URETEROSCOPY WITH HOLMIUM LASER LITHOTRIPSY;  Surgeon: Vanna ScotlandAshley Brandon, MD;  Location: [facility] ORS;  Service: Urology;  Laterality: Left;    Prior to Admission medications   Medication Sig Start Date End Date Taking? Authorizing Provider  albuterol (PROVENTIL HFA;VENTOLIN HFA) 108 (90 BASE)  MCG/ACT inhaler Inhale 2 puffs into the lungs every 6 (six) hours as needed for wheezing or shortness of breath.    Historical Provider, MD  cyclobenzaprine (FLEXERIL) 5 MG tablet Take 5 mg by mouth at bedtime as needed for muscle spasms.    Historical Provider, MD  guaiFENesin-codeine 100-10 MG/5ML syrup Take 10 mLs by mouth 3 (three) times daily as needed. 03/24/15   Chinita Pesterari B Triplett, FNP  HYDROcodone-acetaminophen (NORCO/VICODIN) 5-325 MG tablet Take 1-2 tablets by mouth every 6 (six) hours as needed for moderate pain. 05/19/15   Vanna ScotlandAshley Brandon, MD  ondansetron (ZOFRAN) 4 MG tablet Take 1 tablet (4 mg total) by mouth every 8 (eight) hours as needed for nausea or vomiting. 03/24/15   Chinita Pesterari B Triplett, FNP  oxybutynin (DITROPAN) 5 MG tablet Take 1 tablet (5 mg total) by mouth every 8 (eight) hours as needed for bladder spasms. 05/19/15   Vanna ScotlandAshley Brandon, MD  tamsulosin (FLOMAX) 0.4 MG CAPS capsule Take 1 capsule (0.4 mg total) by mouth daily. 05/19/15   Vanna ScotlandAshley Brandon, MD    Allergies Adhesive [tape]  Family History  Problem Relation Age of Onset  . Diabetes Mellitus II Mother     Social History Social History  Substance Use Topics  . Smoking status: Current Every Day Smoker  . Smokeless tobacco: Never Used  . Alcohol use No    Review of Systems  Constitutional: Negative for fever. + Lightheadedness Eyes: Negative for visual changes. ENT: Negative for sore throat. Cardiovascular: Negative for chest pain. Respiratory: Negative for shortness of breath. Gastrointestinal: + suprapubic cramping abdominal pain. No vomiting or diarrhea.  Genitourinary: Negative for dysuria. + vaginal bleeding Musculoskeletal: Negative for back pain. Skin: Negative for rash. Neurological: Negative for headaches, weakness or numbness.  ____________________________________________   PHYSICAL EXAM:  VITAL SIGNS: ED Triage Vitals  Enc Vitals Group     BP 12/06/15 1303 123/81     Pulse Rate 12/06/15 1303 78      Resp 12/06/15 1303 18     Temp 12/06/15 1303 97.7 F (36.5 C)     Temp Source 12/06/15 1303 Oral     SpO2 12/06/15 1303 97 %     Weight 12/06/15 1305 170 lb (77.1 kg)     Height 12/06/15 1305 5\' 2"  (1.575 m)     Head Circumference --      Peak Flow --      Pain Score 12/06/15 1305 6     Pain Loc --      Pain Edu? --      Excl. in GC? --     Constitutional: Alert and oriented. Well appearing and in no apparent distress. HEENT:      Head: Normocephalic and atraumatic.         Eyes: Conjunctivae are normal. Sclera is non-icteric. EOMI. PERRL      Mouth/Throat: Mucous membranes are moist.       Neck: Supple with no signs of meningismus. Cardiovascular: Regular rate and rhythm. No murmurs, gallops, or rubs. 2+ symmetrical distal pulses are present in all extremities. No JVD. Respiratory: Normal respiratory effort. Lungs are clear to auscultation bilaterally. No wheezes, crackles, or rhonchi.  Gastrointestinal: Soft, mild suprapubic ttp, non distended with positive bowel sounds. No rebound or guarding. Genitourinary: No CVA tenderness. Pelvic exam: Normal external genitalia, no rashes or lesions. Normal cervical mucus. Os closed. Small amount of blood in the vaginal vault. No cervical motion tenderness.  No uterine or adnexal tenderness.  Musculoskeletal: Nontender with normal range of motion in all extremities. No edema, cyanosis, or erythema of extremities. Neurologic: Normal speech and language. Face is symmetric. Moving all extremities. No gross focal neurologic deficits are appreciated. Skin: Skin is warm, dry and intact. No rash noted. Psychiatric: Mood and affect are normal. Speech and behavior are normal.  ____________________________________________   LABS (all labs ordered are listed, but only abnormal results are displayed)  Labs Reviewed  HCG, QUANTITATIVE, PREGNANCY  CBC  POC URINE PREG, ED  POCT PREGNANCY, URINE    ____________________________________________  EKG  none ____________________________________________  RADIOLOGY  none  ____________________________________________   PROCEDURES  Procedure(s) performed: None Procedures Critical Care performed:  None ____________________________________________   INITIAL IMPRESSION / ASSESSMENT AND PLAN / ED COURSE   20 y.o. female history of anxiety, depression, and asthma who presents for evaluation of suprapubic abdominal cramping since this morning and vaginal bleeding. Patient is not pregnant. She reports that she passed 2 large clots this morning. She has a history of irregular menstrual periods. Her vital signs are within normal limits. She has mild tenderness to palpation in the suprapubic region with no rebound or guarding. Plan for pelvic exam, we'll give Toradol IM for pain. HCG and urine pregnancy here negative. We'll check a CBC as patient was complaining of feeling lightheaded. Presentation most likely concerning for menstrual period. We'll refer patient back to OB/GYN for follow-up.  Clinical Course  Comment By Time  Pelvic exam with small amount of blood in the vaginal vault. No other acute findings. Hemoglobin stable. Vital signs stable. Presentation most likely concerning for menstrual cramps and bleed. We'll discharge home  and refer to OB/GYN for follow-up. Nita Sickle, MD 08/19 [phone]    Pertinent labs & imaging results that were available during my care of the patient were reviewed by me and considered in my medical decision making (see chart for details).    ____________________________________________   FINAL CLINICAL IMPRESSION(S) / ED DIAGNOSES  Final diagnoses:  Vaginal bleeding  Menstrual cramp  Irregular menstrual bleeding      NEW MEDICATIONS STARTED DURING THIS VISIT:  New Prescriptions   No medications on file     Note:  This document was prepared using Dragon voice recognition software and  may include unintentional dictation errors.    Nita Sickle, MD 12/06/15 1655

## 2015-12-06 NOTE — ED Notes (Signed)
Dr. Don PerkingVeronese at bedside with patient.  Patient states that she thinks she had a miscarriage. Patient states that she went to take a shower and looked down and there was a "huge clot". Patient states that a little later she passed another clot. Patient denies any bleeding other than the blood clots. Patient states that she has not had a period in over a year. Patient reports that she is not on any kind of birth control at this time. Patient also reports cramping pain that started around the same time as she passed the blood clots.  Patient reports some dizziness.

## 2015-12-06 NOTE — ED Triage Notes (Signed)
Pt presents with vaginal bleeding started this am passing large clots with some abd cramping.

## 2015-12-06 NOTE — Discharge Instructions (Signed)
Take ibuprofen 400 mg every 6 hours for pain. [facility] for your abnormal menstrual bleeding. Return to the emergency department if you have severe bleeding for a few days, if you feel like you are going to pass out, severe or new abdominal pain, or any other symptoms concerning to you.

## 2016-06-20 ENCOUNTER — Encounter (HOSPITAL_COMMUNITY): Payer: Self-pay | Admitting: Physician Assistant

## 2017-02-09 ENCOUNTER — Emergency Department: Payer: Self-pay

## 2017-02-09 ENCOUNTER — Emergency Department
Admission: EM | Admit: 2017-02-09 | Discharge: 2017-02-09 | Disposition: A | Discharge status: Home / Self Care | Payer: Self-pay | Attending: Emergency Medicine | Admitting: Emergency Medicine

## 2017-02-09 DIAGNOSIS — B9789 Other viral agents as the cause of diseases classified elsewhere: Secondary | ICD-10-CM

## 2017-02-09 DIAGNOSIS — R1111 Vomiting without nausea: Secondary | ICD-10-CM | POA: Insufficient documentation

## 2017-02-09 DIAGNOSIS — F17200 Nicotine dependence, unspecified, uncomplicated: Secondary | ICD-10-CM | POA: Insufficient documentation

## 2017-02-09 DIAGNOSIS — J45909 Unspecified asthma, uncomplicated: Secondary | ICD-10-CM | POA: Insufficient documentation

## 2017-02-09 DIAGNOSIS — J069 Acute upper respiratory infection, unspecified: Secondary | ICD-10-CM | POA: Insufficient documentation

## 2017-02-09 DIAGNOSIS — B349 Viral infection, unspecified: Secondary | ICD-10-CM | POA: Insufficient documentation

## 2017-02-09 DIAGNOSIS — Z79899 Other long term (current) drug therapy: Secondary | ICD-10-CM | POA: Insufficient documentation

## 2017-02-09 LAB — URINALYSIS, COMPLETE (UACMP) WITH MICROSCOPIC
Glucose, UA: NEGATIVE mg/dL
Hgb urine dipstick: NEGATIVE
Ketones, ur: NEGATIVE mg/dL
Nitrite: NEGATIVE
Protein, ur: 30 mg/dL — AB
SPECIFIC GRAVITY, URINE: 1.024 (ref 1.005–1.030)
pH: 5 (ref 5.0–8.0)

## 2017-02-09 LAB — POCT PREGNANCY, URINE: PREG TEST UR: NEGATIVE

## 2017-02-09 MED ORDER — ONDANSETRON 4 MG PO TBDP: 4.0000 mg | ORAL_TABLET | Freq: Four times a day (QID) | ORAL | 0 refills | Status: DC | PRN

## 2017-02-09 MED ORDER — FLUTICASONE PROPIONATE 50 MCG/ACT NA SUSP: 2.0000 | Freq: Every day | NASAL | 0 refills | Status: DC

## 2017-02-09 MED ORDER — BENZONATATE 100 MG PO CAPS: ORAL_CAPSULE | ORAL | 0 refills | Status: DC

## 2017-02-09 MED ORDER — ONDANSETRON 4 MG PO TBDP
4.0000 mg | ORAL_TABLET | Freq: Once | ORAL | Status: AC
  Administered 2017-02-09: 4 mg via ORAL
  Filled 2017-02-09: qty 1

## 2017-02-09 MED ORDER — ALBUTEROL SULFATE HFA 108 (90 BASE) MCG/ACT IN AERS: 2.0000 | INHALATION_SPRAY | Freq: Four times a day (QID) | RESPIRATORY_TRACT | 0 refills | Status: DC | PRN

## 2017-02-09 MED ORDER — IPRATROPIUM-ALBUTEROL 0.5-2.5 (3) MG/3ML IN SOLN
3.0000 mL | Freq: Once | RESPIRATORY_TRACT | Status: AC
  Administered 2017-02-09: 3 mL via RESPIRATORY_TRACT
  Filled 2017-02-09: qty 3

## 2017-02-09 NOTE — Discharge Instructions (Signed)
Your exam and x-ray do not reveal a serious respiratory infection. Take the prescription meds as directed. Consider starting a daily allergy medicine + pseudoephedrine. [facility] or your provider for continued symptoms.

## 2017-02-09 NOTE — ED Notes (Signed)
Pt presents with uri symptoms x 2 weeks; states that sore throat ("from my ear all the way down")started a week ago. She reports pain with swallowing. She indicates that she began vomiting last night and has not been able to keep anything down.

## 2017-02-09 NOTE — ED Triage Notes (Signed)
Sore throat, nasal congestion, right ear pain greater than one week. Pt alert and oriented X4, active, cooperative, pt in NAD. RR even and unlabored, color WNL.

## 2017-02-09 NOTE — ED Provider Notes (Signed)
[facility] Emergency Department Provider Note ____________________________________________  Time seen: 1546  I have reviewed the triage vital signs and the nursing notes.  HISTORY  Chief Complaint  URI and Sore Throat  HPI Suzanne Nelson is a 21 y.o. female presents to the ED with a 2-week complaint of sore throat, nasal congestion, right ear pressure and chest tightness.  She describes last night she had episodes of vomiting and has had difficult time keeping even liquids down.  She presents today with out any abdominal pain, abnormal vaginal bleeding, or shortness of breath.  She does admit to chest tightness and heaviness.  Past Medical History:  Diagnosis Date  . ADHD (attention deficit hyperactivity disorder)   . Anxiety   . Asthma   . Asthma   . Depression   . No pertinent past medical history     Patient Active Problem List   Diagnosis Date Noted  . Hydronephrosis 05/18/2015  . Calculus of distal left ureter 05/18/2015  . Left ureteral stone 05/18/2015  . Major depressive disorder, single episode, moderate (HCC) 03/16/2011  . 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 Oppositional defiant disorder 03/16/2011    Past Surgical History:  Procedure Laterality Date  . CYSTO N/A 05/19/2015   Procedure: CYSTO;  Surgeon: Vanna Scotland, MD;  Location: [facility] ORS;  Service: Urology;  Laterality: N/A;  . NO PAST SURGERIES    . URETEROSCOPY WITH HOLMIUM LASER LITHOTRIPSY Left 05/19/2015   Procedure: URETEROSCOPY WITH HOLMIUM LASER LITHOTRIPSY;  Surgeon: Vanna Scotland, MD;  Location: [facility] ORS;  Service: Urology;  Laterality: Left;    Prior to Admission medications   Medication Sig Start Date End Date Taking? Authorizing Provider  albuterol (PROVENTIL HFA;VENTOLIN HFA) 108 (90 BASE) MCG/ACT inhaler Inhale 2 puffs into the lungs every 6 (six) hours as needed for wheezing or shortness of breath.    [provider]   albuterol (PROVENTIL HFA;VENTOLIN HFA) 108 (90 Base) MCG/ACT inhaler Inhale 2 puffs into the lungs every 6 (six) hours as needed for wheezing or shortness of breath. 02/09/17   Beverley Sherrard, Charlesetta Ivory, PA-C  benzonatate (TESSALON PERLES) 100 MG capsule Take 1-2 tabs TID prn cough 02/09/17   Latash Nouri, Charlesetta Ivory, PA-C  cyclobenzaprine (FLEXERIL) 5 MG tablet Take 5 mg by mouth at bedtime as needed for muscle spasms.    [provider]  fluticasone (FLONASE) 50 MCG/ACT nasal spray Place 2 sprays into both nostrils daily. 02/09/17   Alioune Hodgkin, Charlesetta Ivory, PA-C  guaiFENesin-codeine 100-10 MG/5ML syrup Take 10 mLs by mouth 3 (three) times daily as needed. 03/24/15   Triplett, Rulon Eisenmenger B, FNP  HYDROcodone-acetaminophen (NORCO/VICODIN) 5-325 MG tablet Take 1-2 tablets by mouth every 6 (six) hours as needed for moderate pain. 05/19/15   Vanna Scotland, MD  MedroxyPROGESTERone Acetate (DEPO-PROVERA IM) Inject into the muscle. Pt states that she had the shot last month in October.     [provider]  ondansetron (ZOFRAN ODT) 4 MG disintegrating tablet Take 1 tablet (4 mg total) by mouth every 6 (six) hours as needed for nausea or vomiting. 02/09/17   Lisaanne Lawrie, Charlesetta Ivory, PA-C  ondansetron (ZOFRAN) 4 MG tablet Take 1 tablet (4 mg total) by mouth every 8 (eight) hours as needed for nausea or vomiting. 03/24/15   Triplett, Rulon Eisenmenger B, FNP  oxybutynin (DITROPAN) 5 MG tablet Take 1 tablet (5 mg total) by mouth every 8 (eight) hours as needed for bladder spasms. 05/19/15   Vanna Scotland,  MD  tamsulosin (FLOMAX) 0.4 MG CAPS capsule Take 1 capsule (0.4 mg total) by mouth daily. 05/19/15   Vanna ScotlandBrandon, Ashley, MD    Allergies Adhesive [tape]  Family History  Problem Relation Age of Onset  . Diabetes Mellitus II Mother   . Bipolar disorder Mother     Social History Social History  Substance Use Topics  . Smoking status: Current Every Day Smoker  . Smokeless tobacco: Never Used  . Alcohol use No     Review of Systems  Constitutional: Negative for fever. Eyes: Negative for visual changes. ENT: Negative for sore throat. Cardiovascular: Negative for chest pain.  Respiratory: Negative for shortness of breath. Reports chest congestion and tightness. Gastrointestinal: Negative for abdominal pain, diarrhea. Reports vomiting as above Genitourinary: Negative for dysuria. Musculoskeletal: Negative for back pain. Skin: Negative for rash. Neurological: Negative for headaches, focal weakness or numbness. ____________________________________________  PHYSICAL EXAM:  VITAL SIGNS: ED Triage Vitals [02/09/17 1400]  Enc Vitals Group     BP 119/84     Pulse Rate (!) 103     Resp 17     Temp 97.8 F (36.6 C)     Temp Source Oral     SpO2 98 %     Weight      Height      Head Circumference      Peak Flow      Pain Score 8     Pain Loc      Pain Edu?      Excl. in GC?     Constitutional: Alert and oriented. Well appearing and in no distress. Head: Normocephalic and atraumatic. Eyes: Conjunctivae are normal. PERRL. Normal extraocular movements Ears: Canals clear. TMs intact bilaterally. Serous effusion noted bilaterally.  Nose: No congestion/rhinorrhea/epistaxis. Mouth/Throat: Mucous membranes are moist. Uvula is midline and tonsils flat without exudate.  Neck: Supple. No thyromegaly. Hematological/Lymphatic/Immunological: No cervical lymphadenopathy. Cardiovascular: Normal rate, regular rhythm. Normal distal pulses. Respiratory: Normal respiratory effort. No wheezes/rales/rhonchi. Gastrointestinal: Soft and nontender. No distention. Musculoskeletal: Nontender with normal range of motion in all extremities.  Neurologic:  Normal gait without ataxia. Normal speech and language. No gross focal neurologic deficits are appreciated. Skin:  Skin is warm, dry and intact. No rash noted. ____________________________________________   LABS (pertinent positives/negatives) Labs Reviewed   URINALYSIS, COMPLETE (UACMP) WITH MICROSCOPIC - Abnormal; Notable for the following:       Result Value   Color, Urine YELLOW (*)    APPearance HAZY (*)    Bilirubin Urine SMALL (*)    Protein, ur 30 (*)    Leukocytes, UA TRACE (*)    Bacteria, UA RARE (*)    Squamous Epithelial / LPF 0-5 (*)    All other components within normal limits  POC URINE PREG, ED  POCT PREGNANCY, URINE  ____________________________________________   RADIOLOGY  CXR  IMPRESSION: No active cardiopulmonary disease ____________________________________________  PROCEDURES  Zofran 4 mg ODT DuoNeb x 1 ____________________________________________  INITIAL IMPRESSION / ASSESSMENT AND PLAN / ED COURSE  Patient presents to the ED for 2-week complaint of cough, congestion, and chest tightness.  Her exam is overall benign and her chest x-ray is reassuring and shows no acute infectious process.  She may be experiencing a viral URI as well as some early viral gastroenteritis.  No signs of dehydration on exam and she responded well to antinausea medicine in the ED.  She reports significant improvement of her chest tightness and heaviness following the nebulizer treatment.  She will be  discharged at this time with prescriptions for Zofran, Tessalon Perles, Flonase, and albuterol.  She is advised to take an over-the-counter daily allergy medicine as well as consideration of taking Delsym for cough suppression.  Return precautions are reviewed. ____________________________________________  FINAL CLINICAL IMPRESSION(S) / ED DIAGNOSES  Final diagnoses:  Viral URI with cough  Non-intractable vomiting without nausea, unspecified vomiting type     Lissa Hoard, PA-C 02/09/17 1708    Don Perking, [place], MD 02/09/17 [phone]

## 2017-09-01 ENCOUNTER — Encounter: Payer: Self-pay | Admitting: Emergency Medicine

## 2017-09-01 ENCOUNTER — Other Ambulatory Visit: Payer: Self-pay

## 2017-09-01 ENCOUNTER — Emergency Department
Admission: EM | Admit: 2017-09-01 | Discharge: 2017-09-01 | Disposition: A | Discharge status: Home / Self Care | Payer: PRIVATE HEALTH INSURANCE | Attending: Emergency Medicine | Admitting: Emergency Medicine

## 2017-09-01 DIAGNOSIS — F419 Anxiety disorder, unspecified: Secondary | ICD-10-CM | POA: Diagnosis not present

## 2017-09-01 DIAGNOSIS — F902 Attention-deficit hyperactivity disorder, combined type: Secondary | ICD-10-CM | POA: Diagnosis not present

## 2017-09-01 DIAGNOSIS — Z79899 Other long term (current) drug therapy: Secondary | ICD-10-CM | POA: Insufficient documentation

## 2017-09-01 DIAGNOSIS — R112 Nausea with vomiting, unspecified: Secondary | ICD-10-CM | POA: Diagnosis present

## 2017-09-01 DIAGNOSIS — F913 Oppositional defiant disorder: Secondary | ICD-10-CM | POA: Diagnosis not present

## 2017-09-01 DIAGNOSIS — F329 Major depressive disorder, single episode, unspecified: Secondary | ICD-10-CM | POA: Diagnosis not present

## 2017-09-01 DIAGNOSIS — F17200 Nicotine dependence, unspecified, uncomplicated: Secondary | ICD-10-CM | POA: Insufficient documentation

## 2017-09-01 DIAGNOSIS — N390 Urinary tract infection, site not specified: Secondary | ICD-10-CM | POA: Diagnosis not present

## 2017-09-01 DIAGNOSIS — J45909 Unspecified asthma, uncomplicated: Secondary | ICD-10-CM | POA: Insufficient documentation

## 2017-09-01 LAB — COMPREHENSIVE METABOLIC PANEL
ALK PHOS: 66 U/L (ref 38–126)
ALT: 18 U/L (ref 14–54)
AST: 20 U/L (ref 15–41)
Albumin: 4.6 g/dL (ref 3.5–5.0)
Anion gap: 9 (ref 5–15)
BUN: 7 mg/dL (ref 6–20)
CALCIUM: 9.1 mg/dL (ref 8.9–10.3)
CO2: 24 mmol/L (ref 22–32)
Chloride: 106 mmol/L (ref 101–111)
Creatinine, Ser: 0.65 mg/dL (ref 0.44–1.00)
GFR calc Af Amer: >60 mL/min (ref >60)
GFR calc non Af Amer: >60 mL/min (ref >60)
Glucose, Bld: 86 mg/dL (ref 65–99)
Potassium: 3.9 mmol/L (ref 3.5–5.1)
SODIUM: 139 mmol/L (ref 135–145)
TOTAL PROTEIN: 7.1 g/dL (ref 6.5–8.1)
Total Bilirubin: 1 mg/dL (ref 0.3–1.2)

## 2017-09-01 LAB — CBC
HCT: 43.9 % (ref 35.0–47.0)
Hemoglobin: 15.1 g/dL (ref 12.0–16.0)
MCH: 31.3 pg (ref 26.0–34.0)
MCHC: 34.4 g/dL (ref 32.0–36.0)
MCV: 90.9 fL (ref 80.0–100.0)
PLATELETS: 311 10*3/uL (ref 150–440)
RBC: 4.83 MIL/uL (ref 3.80–5.20)
RDW: 13.2 % (ref 11.5–14.5)
WBC: 7.9 10*3/uL (ref 3.6–11.0)

## 2017-09-01 LAB — POCT PREGNANCY, URINE: Preg Test, Ur: NEGATIVE

## 2017-09-01 LAB — URINALYSIS, COMPLETE (UACMP) WITH MICROSCOPIC
BILIRUBIN URINE: NEGATIVE
Bacteria, UA: NONE SEEN
Glucose, UA: NEGATIVE mg/dL
HGB URINE DIPSTICK: NEGATIVE
Ketones, ur: NEGATIVE mg/dL
NITRITE: NEGATIVE
PH: 5 (ref 5.0–8.0)
Protein, ur: NEGATIVE mg/dL
SPECIFIC GRAVITY, URINE: 1.023 (ref 1.005–1.030)

## 2017-09-01 LAB — LIPASE, BLOOD: LIPASE: 25 U/L (ref 11–51)

## 2017-09-01 MED ORDER — ONDANSETRON 4 MG PO TBDP: 4.0000 mg | ORAL_TABLET | Freq: Three times a day (TID) | ORAL | 0 refills | Status: DC | PRN

## 2017-09-01 MED ORDER — CEPHALEXIN 500 MG PO CAPS
500.0000 mg | ORAL_CAPSULE | Freq: Three times a day (TID) | ORAL | 0 refills | Status: DC
Start: 2017-09-01 — End: 2018-03-20

## 2017-09-01 MED ORDER — ONDANSETRON 4 MG PO TBDP
4.0000 mg | ORAL_TABLET | Freq: Once | ORAL | Status: AC
  Administered 2017-09-01: 4 mg via ORAL
  Filled 2017-09-01: qty 1

## 2017-09-01 NOTE — ED Notes (Signed)
Gave pt some water, told her take small sips.

## 2017-09-01 NOTE — ED Triage Notes (Signed)
Says vomitng all night and stomach cramping.  No diarrhea.

## 2017-09-01 NOTE — ED Notes (Signed)
Pt ambulatory to POV with mother without difficulty,VSS, NAD. Discharge instructions, RX and follow up discussed. All questions and concerns addressed.

## 2017-09-01 NOTE — ED Provider Notes (Signed)
[facility] Emergency Department Provider Note  Time seen: 2:40 PM  I have reviewed the triage vital signs and the nursing notes.   HISTORY  Chief Complaint Abdominal Pain and Emesis    HPI Suzanne Nelson is a 22 y.o. female with a past medical history of ADHD, anxiety, depression, presents to the emergency department for nausea vomiting.  According to the patient since last night she developed nausea vomiting is had multiple episodes of vomiting.  Last vomited approximately 4 5 hours ago.  Denies any diarrhea but states she is now having abdominal cramping like she might develop diarrhea.  Denies any fever, dysuria, hematuria, vaginal bleeding or discharge.  Patient states she cannot keep down fluids today so she came to the emergency department for evaluation.    Past Medical History:  Diagnosis Date  . ADHD (attention deficit hyperactivity disorder)   . Anxiety   . Asthma   . Asthma   . Depression   . No pertinent past medical history     Patient Active Problem List   Diagnosis Date Noted  . Hydronephrosis 05/18/2015  . Calculus of distal left ureter 05/18/2015  . Left ureteral stone 05/18/2015  . Major depressive disorder, single episode, moderate (HCC) 03/16/2011  . 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 Oppositional defiant disorder 03/16/2011    Past Surgical History:  Procedure Laterality Date  . CYSTO N/A 05/19/2015   Procedure: CYSTO;  Surgeon: Vanna Scotland, MD;  Location: [facility] ORS;  Service: Urology;  Laterality: N/A;  . NO PAST SURGERIES    . URETEROSCOPY WITH HOLMIUM LASER LITHOTRIPSY Left 05/19/2015   Procedure: URETEROSCOPY WITH HOLMIUM LASER LITHOTRIPSY;  Surgeon: Vanna Scotland, MD;  Location: [facility] ORS;  Service: Urology;  Laterality: Left;    Prior to Admission medications   Medication Sig Start Date End Date Taking? Authorizing Provider  albuterol (PROVENTIL HFA;VENTOLIN HFA) 108 (90 BASE)  MCG/ACT inhaler Inhale 2 puffs into the lungs every 6 (six) hours as needed for wheezing or shortness of breath.    [provider]  albuterol (PROVENTIL HFA;VENTOLIN HFA) 108 (90 Base) MCG/ACT inhaler Inhale 2 puffs into the lungs every 6 (six) hours as needed for wheezing or shortness of breath. 02/09/17   Menshew, Charlesetta Ivory, PA-C  benzonatate (TESSALON PERLES) 100 MG capsule Take 1-2 tabs TID prn cough 02/09/17   Menshew, Charlesetta Ivory, PA-C  cyclobenzaprine (FLEXERIL) 5 MG tablet Take 5 mg by mouth at bedtime as needed for muscle spasms.    [provider]  fluticasone (FLONASE) 50 MCG/ACT nasal spray Place 2 sprays into both nostrils daily. 02/09/17   Menshew, Charlesetta Ivory, PA-C  guaiFENesin-codeine 100-10 MG/5ML syrup Take 10 mLs by mouth 3 (three) times daily as needed. 03/24/15   Triplett, Rulon Eisenmenger B, FNP  HYDROcodone-acetaminophen (NORCO/VICODIN) 5-325 MG tablet Take 1-2 tablets by mouth every 6 (six) hours as needed for moderate pain. 05/19/15   Vanna Scotland, MD  MedroxyPROGESTERone Acetate (DEPO-PROVERA IM) Inject into the muscle. Pt states that she had the shot last month in October.     [provider]  ondansetron (ZOFRAN ODT) 4 MG disintegrating tablet Take 1 tablet (4 mg total) by mouth every 6 (six) hours as needed for nausea or vomiting. 02/09/17   Menshew, Charlesetta Ivory, PA-C  ondansetron (ZOFRAN) 4 MG tablet Take 1 tablet (4 mg total) by mouth every 8 (eight) hours as needed for nausea or vomiting. 03/24/15   Triplett, Cari B,  FNP  oxybutynin (DITROPAN) 5 MG tablet Take 1 tablet (5 mg total) by mouth every 8 (eight) hours as needed for bladder spasms. 05/19/15   Vanna Scotland, MD  tamsulosin (FLOMAX) 0.4 MG CAPS capsule Take 1 capsule (0.4 mg total) by mouth daily. 05/19/15   Vanna Scotland, MD    Allergies  Allergen Reactions  . Adhesive [Tape] Other (See Comments)    Family History  Problem Relation Age of Onset  . Diabetes Mellitus II  Mother   . Bipolar disorder Mother     Social History Social History   Tobacco Use  . Smoking status: Current Every Day Smoker  . Smokeless tobacco: Never Used  Substance Use Topics  . Alcohol use: No  . Drug use: No    Frequency: 1.0 times per week    Types: Marijuana    Comment: Last use one month age    Review of Systems Constitutional: Negative for fever. Eyes: Negative for visual complaints ENT: Negative for recent illness/congestion Cardiovascular: Negative for chest pain. Respiratory: Negative for shortness of breath. Gastrointestinal: Mild abdominal cramping.  Positive for nausea vomiting.  Negative diarrhea. Genitourinary: Negative for urinary compaints.  Negative for vaginal discharge or bleeding. Musculoskeletal: Negative for musculoskeletal complaints Skin: Negative for skin complaints  Neurological: Negative for headache All other ROS negative  ____________________________________________   PHYSICAL EXAM:  VITAL SIGNS: ED Triage Vitals  Enc Vitals Group     BP 09/01/17 1157 104/70     Pulse Rate 09/01/17 1157 86     Resp 09/01/17 1157 16     Temp 09/01/17 1157 98.6 F (37 C)     Temp Source 09/01/17 1157 Oral     SpO2 09/01/17 1157 98 %     Weight 09/01/17 1158 185 lb (83.9 kg)     Height 09/01/17 1158  (1.575 m)     Head Circumference --      Peak Flow --      Pain Score 09/01/17 1158 5     Pain Loc --      Pain Edu? --      Excl. in GC? --     Constitutional: Alert and oriented. Well appearing and in no distress. Eyes: Normal exam ENT   Head: Normocephalic and atraumatic   Mouth/Throat: Mucous membranes are moist. Cardiovascular: Normal rate, regular rhythm. No murmur Respiratory: Normal respiratory effort without tachypnea nor retractions. Breath sounds are clear  Gastrointestinal: Soft and nontender. No distention.   Musculoskeletal: Nontender with normal range of motion in all extremities.  Neurologic:  Normal speech and  language. No gross focal neurologic deficits  Skin:  Skin is warm, dry and intact.  Psychiatric: Mood and affect are normal.   ____________________________________________   INITIAL IMPRESSION / ASSESSMENT AND PLAN / ED COURSE  Pertinent labs & imaging results that were available during my care of the patient were reviewed by me and considered in my medical decision making (see chart for details).  Patient presents to the emergency department for nausea vomiting.  Patient states nausea vomiting started last night and this continues until today.  Patient states she was supposed to go to work today but was too nauseated to go so she came to the emergency department for evaluation.  Differential would include gastroenteritis, enteritis, gastritis, pregnancy.  Patient's labs are largely within normal limits besides a mild urinary tract infection, urine culture has been added.  No clinical signs of dehydration.  Will dose oral Zofran and attempt oral hydration.  As long as the patient is able to orally hydrate we will discharge home with Zofran and PCP follow-up.  Patient has a completely nontender/benign abdominal exam with reassuring lab work.  Patient agreeable to this plan of care.  The patient is unable to tolerate p.o. trial we will place an IV for IV hydration.  Tolerating p.o. very well.  We will discharge with Zofran and supportive care at home.  Patient agreeable to plan of care.  ____________________________________________   FINAL CLINICAL IMPRESSION(S) / ED DIAGNOSES  Nausea vomiting    Minna Antis, MD 09/01/17 1616

## 2017-11-24 ENCOUNTER — Emergency Department
Admission: EM | Admit: 2017-11-24 | Discharge: 2017-11-24 | Disposition: A | Discharge status: Home / Self Care | Payer: PRIVATE HEALTH INSURANCE | Attending: Emergency Medicine | Admitting: Emergency Medicine

## 2017-11-24 ENCOUNTER — Other Ambulatory Visit: Payer: Self-pay

## 2017-11-24 ENCOUNTER — Emergency Department: Payer: PRIVATE HEALTH INSURANCE

## 2017-11-24 ENCOUNTER — Encounter: Payer: Self-pay | Admitting: Emergency Medicine

## 2017-11-24 DIAGNOSIS — K2900 Acute gastritis without bleeding: Secondary | ICD-10-CM | POA: Insufficient documentation

## 2017-11-24 DIAGNOSIS — F17200 Nicotine dependence, unspecified, uncomplicated: Secondary | ICD-10-CM | POA: Insufficient documentation

## 2017-11-24 DIAGNOSIS — Z79899 Other long term (current) drug therapy: Secondary | ICD-10-CM | POA: Diagnosis not present

## 2017-11-24 DIAGNOSIS — K2970 Gastritis, unspecified, without bleeding: Secondary | ICD-10-CM

## 2017-11-24 DIAGNOSIS — J45909 Unspecified asthma, uncomplicated: Secondary | ICD-10-CM | POA: Diagnosis not present

## 2017-11-24 DIAGNOSIS — R1010 Upper abdominal pain, unspecified: Secondary | ICD-10-CM | POA: Diagnosis present

## 2017-11-24 LAB — CBC WITH DIFFERENTIAL/PLATELET
BASOS PCT: 1 %
Basophils Absolute: 0.1 10*3/uL (ref 0–0.1)
EOS ABS: 0.4 10*3/uL (ref 0–0.7)
EOS PCT: 4 %
HCT: 43 % (ref 35.0–47.0)
HEMOGLOBIN: 14.6 g/dL (ref 12.0–16.0)
LYMPHS ABS: 3.3 10*3/uL (ref 1.0–3.6)
Lymphocytes Relative: 30 %
MCH: 30.8 pg (ref 26.0–34.0)
MCHC: 33.9 g/dL (ref 32.0–36.0)
MCV: 91 fL (ref 80.0–100.0)
Monocytes Absolute: 1 10*3/uL — ABNORMAL HIGH (ref 0.2–0.9)
Monocytes Relative: 9 %
NEUTROS PCT: 56 %
Neutro Abs: 6.3 10*3/uL (ref 1.4–6.5)
PLATELETS: 292 10*3/uL (ref 150–440)
RBC: 4.73 MIL/uL (ref 3.80–5.20)
RDW: 13.1 % (ref 11.5–14.5)
WBC: 11 10*3/uL (ref 3.6–11.0)

## 2017-11-24 LAB — URINALYSIS, ROUTINE W REFLEX MICROSCOPIC
BACTERIA UA: NONE SEEN
BILIRUBIN URINE: NEGATIVE
GLUCOSE, UA: NEGATIVE mg/dL
KETONES UR: NEGATIVE mg/dL
LEUKOCYTES UA: NEGATIVE
NITRITE: NEGATIVE
PH: 5 (ref 5.0–8.0)
PROTEIN: NEGATIVE mg/dL
Specific Gravity, Urine: 1.014 (ref 1.005–1.030)

## 2017-11-24 LAB — COMPREHENSIVE METABOLIC PANEL
ALBUMIN: 4.4 g/dL (ref 3.5–5.0)
ALK PHOS: 60 U/L (ref 38–126)
ALT: 15 U/L (ref 0–44)
ANION GAP: 6 (ref 5–15)
AST: 21 U/L (ref 15–41)
BUN: 10 mg/dL (ref 6–20)
CALCIUM: 9 mg/dL (ref 8.9–10.3)
CO2: 26 mmol/L (ref 22–32)
Chloride: 109 mmol/L (ref 98–111)
Creatinine, Ser: 0.74 mg/dL (ref 0.44–1.00)
GFR calc non Af Amer: >60 mL/min (ref >60)
GLUCOSE: 91 mg/dL (ref 70–99)
Potassium: 3.9 mmol/L (ref 3.5–5.1)
Sodium: 141 mmol/L (ref 135–145)
Total Bilirubin: 0.6 mg/dL (ref 0.3–1.2)
Total Protein: 6.9 g/dL (ref 6.5–8.1)

## 2017-11-24 LAB — LIPASE, BLOOD: Lipase: 25 U/L (ref 11–51)

## 2017-11-24 LAB — PREGNANCY, URINE: Preg Test, Ur: NEGATIVE

## 2017-11-24 MED ORDER — RANITIDINE HCL 75 MG PO TABS: 75.0000 mg | ORAL_TABLET | Freq: Two times a day (BID) | ORAL | 0 refills | Status: DC

## 2017-11-24 MED ORDER — GI COCKTAIL ~~LOC~~
30.0000 mL | Freq: Once | ORAL | Status: AC
  Administered 2017-11-24: 30 mL via ORAL
  Filled 2017-11-24: qty 30

## 2017-11-24 NOTE — ED Triage Notes (Signed)
Patient ambulatory to triage with steady gait, without difficulty or distress noted; pt reports sharp shooting pain to upper abd radiating into back since yesterday; denies hx of same

## 2017-11-24 NOTE — ED Notes (Signed)
IV attempt x2, both were unsuccessful. Blood was drawn.

## 2017-11-24 NOTE — ED Notes (Signed)
Resumed care from [place], Charity fundraiserN. Pt resting with family at bedside. She reports hx of ovarian cysts. She points to epigastric/upper abdomen as well as lower abdomen for pain location. Pt reports that she is currently menstruating, and that the lower abdominal pain is typical of menstrual cramps, but the epigastric pain is new. Lower back pain is new as well. Pt alert & oriented with NAD noted.

## 2017-11-24 NOTE — ED Notes (Signed)
PA student at bedside.

## 2017-11-24 NOTE — ED Provider Notes (Signed)
[facility] Emergency Department Provider Note ___________________________________________   First MD Initiated Contact with Patient 11/24/17 0701     (approximate)  I have reviewed the triage vital signs and the nursing notes.   HISTORY  Chief Complaint Abdominal Pain  HPI Suzanne Nelson is a 22 y.o. female with a history of ADHD as well as depression was presenting to the emergency department with sharp, epigastric pain over the past 24 hours.  The patient says the pain is a 9 out of 10 and radiating to the bilateral upper back.  She says that she has been nauseous but without vomiting or diarrhea.  Says that she is on her period.  Says that she has a history of kidney stones which felt differently.  Also history of ovarian cysts which have given pain to her lower abdomen.  Has not taken any medicine for pain relief at home.  Past Medical History:  Diagnosis Date  . ADHD (attention deficit hyperactivity disorder)   . Anxiety   . Asthma   . Asthma   . Depression   . No pertinent past medical history     Patient Active Problem List   Diagnosis Date Noted  . Hydronephrosis 05/18/2015  . Calculus of distal left ureter 05/18/2015  . Left ureteral stone 05/18/2015  . Major depressive disorder, single episode, moderate (HCC) 03/16/2011  . 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 Oppositional defiant disorder 03/16/2011    Past Surgical History:  Procedure Laterality Date  . CYSTO N/A 05/19/2015   Procedure: CYSTO;  Surgeon: Vanna Scotland, MD;  Location: [facility] ORS;  Service: Urology;  Laterality: N/A;  . NO PAST SURGERIES    . URETEROSCOPY WITH HOLMIUM LASER LITHOTRIPSY Left 05/19/2015   Procedure: URETEROSCOPY WITH HOLMIUM LASER LITHOTRIPSY;  Surgeon: Vanna Scotland, MD;  Location: [facility] ORS;  Service: Urology;  Laterality: Left;    Prior to Admission medications   Medication Sig Start Date End Date Taking? Authorizing  Provider  albuterol (PROVENTIL HFA;VENTOLIN HFA) 108 (90 BASE) MCG/ACT inhaler Inhale 2 puffs into the lungs every 6 (six) hours as needed for wheezing or shortness of breath.    [provider]  albuterol (PROVENTIL HFA;VENTOLIN HFA) 108 (90 Base) MCG/ACT inhaler Inhale 2 puffs into the lungs every 6 (six) hours as needed for wheezing or shortness of breath. 02/09/17   Menshew, Charlesetta Ivory, PA-C  benzonatate (TESSALON PERLES) 100 MG capsule Take 1-2 tabs TID prn cough 02/09/17   Menshew, Charlesetta Ivory, PA-C  cephALEXin (KEFLEX) 500 MG capsule Take 1 capsule (500 mg total) by mouth 3 (three) times daily. 09/01/17   Minna Antis, MD  cyclobenzaprine (FLEXERIL) 5 MG tablet Take 5 mg by mouth at bedtime as needed for muscle spasms.    [provider]  fluticasone (FLONASE) 50 MCG/ACT nasal spray Place 2 sprays into both nostrils daily. 02/09/17   Menshew, Charlesetta Ivory, PA-C  guaiFENesin-codeine 100-10 MG/5ML syrup Take 10 mLs by mouth 3 (three) times daily as needed. 03/24/15   Triplett, Rulon Eisenmenger B, FNP  HYDROcodone-acetaminophen (NORCO/VICODIN) 5-325 MG tablet Take 1-2 tablets by mouth every 6 (six) hours as needed for moderate pain. 05/19/15   Vanna Scotland, MD  MedroxyPROGESTERone Acetate (DEPO-PROVERA IM) Inject into the muscle. Pt states that she had the shot last month in October.     [provider]  ondansetron (ZOFRAN ODT) 4 MG disintegrating tablet Take 1 tablet (4 mg total) by mouth every 8 (eight) hours  as needed for nausea or vomiting. 09/01/17   Minna Antis, MD  ondansetron (ZOFRAN) 4 MG tablet Take 1 tablet (4 mg total) by mouth every 8 (eight) hours as needed for nausea or vomiting. 03/24/15   Triplett, Rulon Eisenmenger B, FNP  oxybutynin (DITROPAN) 5 MG tablet Take 1 tablet (5 mg total) by mouth every 8 (eight) hours as needed for bladder spasms. 05/19/15   Vanna Scotland, MD  tamsulosin (FLOMAX) 0.4 MG CAPS capsule Take 1 capsule (0.4 mg total) by mouth daily.  05/19/15   Vanna Scotland, MD    Allergies Adhesive [tape]  Family History  Problem Relation Age of Onset  . Diabetes Mellitus II Mother   . Bipolar disorder Mother     Social History Social History   Tobacco Use  . Smoking status: Current Every Day Smoker  . Smokeless tobacco: Never Used  Substance Use Topics  . Alcohol use: No  . Drug use: No    Frequency: 1.0 times per week    Types: Marijuana    Comment: Last use one month age    Review of Systems  Constitutional: No fever/chills Eyes: No visual changes. ENT: No sore throat. Cardiovascular: Denies chest pain. Respiratory: Denies shortness of breath. Gastrointestinal:no vomiting.  No diarrhea.  No constipation. Genitourinary: Negative for dysuria. Musculoskeletal: Negative for back pain. Skin: Negative for rash. Neurological: Negative for headaches, focal weakness or numbness.   ____________________________________________   PHYSICAL EXAM:  VITAL SIGNS: ED Triage Vitals  Enc Vitals Group     BP 11/24/17 0623 109/75     Pulse Rate 11/24/17 0623 78     Resp 11/24/17 0623 20     Temp 11/24/17 0623 (!) 97.4 F (36.3 C)     Temp Source 11/24/17 0623 Oral     SpO2 11/24/17 0623 97 %     Weight 11/24/17 0620 131 lb 6.4 oz (59.6 kg)     Height 11/24/17 0620 5\' 2"  (1.575 m)     Head Circumference --      Peak Flow --      Pain Score 11/24/17 0620 6     Pain Loc --      Pain Edu? --      Excl. in GC? --     Constitutional: Alert and oriented. Well appearing and in no acute distress. Eyes: Conjunctivae are normal.  Head: Atraumatic. Nose: No congestion/rhinnorhea. Mouth/Throat: Mucous membranes are moist.  Neck: No stridor.   Cardiovascular: Normal rate, regular rhythm. Grossly normal heart sounds.  Respiratory: Normal respiratory effort.  No retractions. Lungs CTAB. Gastrointestinal: Soft with moderate epigastric tenderness to palpation without rebound or guarding.  No distention.  Mild bilateral CVA  tenderness. Musculoskeletal: No lower extremity tenderness nor edema.  No joint effusions. Neurologic:  Normal speech and language. No gross focal neurologic deficits are appreciated. Skin:  Skin is warm, dry and intact. No rash noted. Psychiatric: Mood and affect are normal. Speech and behavior are normal.  ____________________________________________   LABS (all labs ordered are listed, but only abnormal results are displayed)  Labs Reviewed  CBC WITH DIFFERENTIAL/PLATELET - Abnormal; Notable for the following components:      Result Value   Monocytes Absolute 1.0 (*)    All other components within normal limits  URINALYSIS, ROUTINE W REFLEX MICROSCOPIC - Abnormal; Notable for the following components:   Color, Urine YELLOW (*)    APPearance CLEAR (*)    Hgb urine dipstick LARGE (*)    All other components within normal  limits  COMPREHENSIVE METABOLIC PANEL  LIPASE, BLOOD  PREGNANCY, URINE  POC URINE PREG, ED   ____________________________________________  EKG   ____________________________________________  RADIOLOGY  Ultrasound of the right upper quadrant with 5 mm gallbladder polyp versus sludge ball.  No gallbladder inflammation. ____________________________________________   PROCEDURES  Procedure(s) performed:   Procedures  Critical Care performed:   ____________________________________________   INITIAL IMPRESSION / ASSESSMENT AND PLAN / ED COURSE  Pertinent labs & imaging results that were available during my care of the patient were reviewed by me and considered in my medical decision making (see chart for details).  Differential diagnosis includes, but is not limited to, biliary disease (biliary colic, acute cholecystitis, cholangitis, choledocholithiasis, etc), intrathoracic causes for epigastric abdominal pain including ACS, gastritis, duodenitis, pancreatitis, small bowel or large bowel obstruction, abdominal aortic aneurysm, hernia, and  ulcer(s). As part of my medical decision making, I reviewed the following data within the electronic MEDICAL RECORD NUMBER Notes from prior ED visits  ----------------------------------------- 9:32 AM on 11/24/2017 -----------------------------------------  Patient at this time says that she feels steady improvement after the GI cocktail and pain is a 2 out of 10 right now in the epigastrium.  I reexamined her abdomen and she is nontender throughout.  Patient now also saying that she used to be on acid reducing medicine all through high school.  Thinks it was Zantac.  I will restart patient on Zantac.  She will follow-up with primary care.  Reassuring lab work as well as imaging.  Patient understands diagnosis as well as treatment plan and willing to comply. ____________________________________________   FINAL CLINICAL IMPRESSION(S) / ED DIAGNOSES  Gastritis.  Upper abdominal pain.  NEW MEDICATIONS STARTED DURING THIS VISIT:  New Prescriptions   No medications on file     Note:  This document was prepared using Dragon voice recognition software and may include unintentional dictation errors.     Myrna BlazerSchaevitz, David Matthew, MD 11/24/17 [phone]

## 2018-03-20 ENCOUNTER — Emergency Department
Admission: EM | Admit: 2018-03-20 | Discharge: 2018-03-20 | Disposition: A | Discharge status: Home / Self Care | Payer: PRIVATE HEALTH INSURANCE | Attending: Emergency Medicine | Admitting: Emergency Medicine

## 2018-03-20 ENCOUNTER — Encounter: Payer: Self-pay | Admitting: Emergency Medicine

## 2018-03-20 DIAGNOSIS — J45909 Unspecified asthma, uncomplicated: Secondary | ICD-10-CM | POA: Insufficient documentation

## 2018-03-20 DIAGNOSIS — F17200 Nicotine dependence, unspecified, uncomplicated: Secondary | ICD-10-CM | POA: Insufficient documentation

## 2018-03-20 DIAGNOSIS — L299 Pruritus, unspecified: Secondary | ICD-10-CM

## 2018-03-20 DIAGNOSIS — L298 Other pruritus: Secondary | ICD-10-CM | POA: Insufficient documentation

## 2018-03-20 MED ORDER — DIPHENHYDRAMINE HCL 25 MG PO CAPS
25.0000 mg | ORAL_CAPSULE | Freq: Once | ORAL | Status: AC
  Administered 2018-03-20: 25 mg via ORAL
  Filled 2018-03-20: qty 1

## 2018-03-20 MED ORDER — RANITIDINE HCL 150 MG PO TABS: 150.0000 mg | ORAL_TABLET | Freq: Two times a day (BID) | ORAL | 0 refills | Status: DC

## 2018-03-20 MED ORDER — FAMOTIDINE 20 MG PO TABS
20.0000 mg | ORAL_TABLET | Freq: Once | ORAL | Status: AC
  Administered 2018-03-20: 20 mg via ORAL
  Filled 2018-03-20: qty 1

## 2018-03-20 NOTE — ED Triage Notes (Signed)
Patient states she took an "energy pill" this morning along with her medication for menstrual cramping.  Patient states she has previously taken both medications but never taken them together.  Patient reports at noon she noticed a red rash and itching to her hands.  Patient states rash has resolved but patient now has itching on "entire body".  No difficulty speaking or maintaining secretions.  No obvious rash.

## 2018-03-20 NOTE — ED Notes (Signed)
Medications patient took were, "JET-ALERT":  Active ingredient 200mg  of caffeine and menstrual complete with acetaminophen, caffeine, and pyrilamine maleate

## 2018-03-20 NOTE — ED Notes (Signed)
See triage note  Presents with cont 'd itching  States not sure if she is having an allergic rxn to med's  Did have rash earlier  No rash at present

## 2018-03-20 NOTE — Discharge Instructions (Addendum)
Your exam is normal at this time. You may be experiencing itching from excessive caffeine. Avoid caffeine for the next few days. Take the OTC Benadryl along with the prescription antihistamine medicine as directed. [facility] or your provider as needed.

## 2018-03-21 NOTE — ED Provider Notes (Signed)
[facility] Emergency Department Provider Note ____________________________________________  Time seen: 1740  I have reviewed the triage vital signs and the nursing notes.  HISTORY  Chief Complaint  Pruritis  HPI Suzanne Nelson is a 22 y.o. female who presents to the ED accompanied by her mother, for evaluation of pruritus.  Patient also reports resolution of some hives to her hands dorsally that have since resolved.  Patient describes the onset was about 10:00 this morning after she had taken a caffeine energy p.o. as well as her caffeine-acetaminophen- pyrilamine menstrual pressure medication.  She describes that about 2 hours after she began to experience some itching to her hands primarily that later spread to her neck and trunk.  She denies any difficulty breathing, swallowing, or controlling secretions.  Also denies any shortness of breath, cough, congestion.  She presents now for further evaluation of her symptoms.  She reports that the itching persists but the rash has not resolved.  She denies any history of known allergies, triggers, or sensitivities.  She has not taken any medications in the interim.  She does admit to eating her lunch without any difficulty.  Past Medical History:  Diagnosis Date  . ADHD (attention deficit hyperactivity disorder)   . Anxiety   . Asthma   . Asthma   . Depression   . No pertinent past medical history     Patient Active Problem List   Diagnosis Date Noted  . Hydronephrosis 05/18/2015  . Calculus of distal left ureter 05/18/2015  . Left ureteral stone 05/18/2015  . Major depressive disorder, single episode, moderate (HCC) 03/16/2011  . 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 Oppositional defiant disorder 03/16/2011    Past Surgical History:  Procedure Laterality Date  . CYSTO N/A 05/19/2015   Procedure: CYSTO;  Surgeon: Vanna ScotlandAshley Brandon, MD;  Location: [facility] ORS;  Service: Urology;   Laterality: N/A;  . NO PAST SURGERIES    . URETEROSCOPY WITH HOLMIUM LASER LITHOTRIPSY Left 05/19/2015   Procedure: URETEROSCOPY WITH HOLMIUM LASER LITHOTRIPSY;  Surgeon: Vanna ScotlandAshley Brandon, MD;  Location: [facility] ORS;  Service: Urology;  Laterality: Left;    Prior to Admission medications   Medication Sig Start Date End Date Taking? Authorizing Provider  oxybutynin (DITROPAN) 5 MG tablet Take 1 tablet (5 mg total) by mouth every 8 (eight) hours as needed for bladder spasms. 05/19/15   Vanna ScotlandBrandon, Ashley, MD  ranitidine (ZANTAC) 150 MG tablet Take 1 tablet (150 mg total) by mouth 2 (two) times daily for 5 days. 03/20/18 03/25/18  Thierry Dobosz, Charlesetta IvoryJenise V Bacon, PA-C    Allergies Adhesive [tape]  Family History  Problem Relation Age of Onset  . Diabetes Mellitus II Mother   . Bipolar disorder Mother     Social History Social History   Tobacco Use  . Smoking status: Current Every Day Smoker  . Smokeless tobacco: Never Used  Substance Use Topics  . Alcohol use: No  . Drug use: No    Frequency: 1.0 times per week    Types: Marijuana    Comment: Last use one month age    Review of Systems  Constitutional: Negative for fever. Eyes: Negative for visual changes. ENT: Negative for sore throat. Cardiovascular: Negative for chest pain. Respiratory: Negative for shortness of breath. Gastrointestinal: Negative for abdominal pain, vomiting and diarrhea. Genitourinary: Negative for dysuria. Musculoskeletal: Negative for back pain. Skin: Positive for rash and itching Neurological: Negative for headaches, focal weakness or numbness. ____________________________________________  PHYSICAL EXAM:  VITAL  SIGNS: ED Triage Vitals  Enc Vitals Group     BP 03/20/18 1711 (!) 139/96     Pulse Rate 03/20/18 1711 89     Resp 03/20/18 1711 18     Temp 03/20/18 1711 98.6 F (37 C)     Temp Source 03/20/18 1711 Oral     SpO2 03/20/18 1711 99 %     Weight 03/20/18 1712 131 lb (59.4 kg)     Height 03/20/18  1712 5\' 2"  (1.575 m)     Head Circumference --      Peak Flow --      Pain Score 03/20/18 1711 5     Pain Loc --      Pain Edu? --      Excl. in GC? --     Constitutional: Alert and oriented. Well appearing and in no distress. Head: Normocephalic and atraumatic. Eyes: Conjunctivae are normal. PERRL. Normal extraocular movements Ears: Canals clear. TMs intact bilaterally. Nose: No congestion/rhinorrhea/epistaxis. Mouth/Throat: Mucous membranes are moist.  Uvula is midline and tonsils are flat.  No oropharyngeal lesions are appreciated.  No oropharyngeal edema is noted. Neck: Supple. No thyromegaly. Hematological/Lymphatic/Immunological: No cervical lymphadenopathy. Cardiovascular: Normal rate, regular rhythm. Normal distal pulses. Respiratory: Normal respiratory effort. No wheezes/rales/rhonchi. Musculoskeletal: Nontender with normal range of motion in all extremities.  Neurologic:  Normal gait without ataxia. Normal speech and language. No gross focal neurologic deficits are appreciated. Skin:  Skin is warm, dry and intact. No rash noted.  Patient with no visible rash, lesions, blisters, whelps, or excoriations noted. ____________________________________________  PROCEDURES  Procedures Famotidine 20 mg PO Benadryl 25 mg PO ____________________________________________  INITIAL IMPRESSION / ASSESSMENT AND PLAN / ED COURSE  She with ED evaluation of itching and a now resolved rash of the hands, that may be concerning for possible drug reaction.  Patient's exam is overall benign without any signs of angioedema, respiratory distress, or anaphylaxis.  Patient is discharged after ED administration of antihistamines in the ED with resolution of her symptoms.  She is also discharged with a prescription for ranitidine which she will dose as directed.  She may take over-the-counter Benadryl for additional histamine 1 blockade.  Return precautions have been  reviewed. ____________________________________________  FINAL CLINICAL IMPRESSION(S) / ED DIAGNOSES  Final diagnoses:  Skin pruritus      Karmen Stabs, Charlesetta Ivory, PA-C 03/21/18 0037    Arnaldo Natal, MD 03/21/18 [phone]

## 2018-03-23 ENCOUNTER — Emergency Department: Payer: PRIVATE HEALTH INSURANCE

## 2018-03-23 DIAGNOSIS — R0789 Other chest pain: Secondary | ICD-10-CM | POA: Insufficient documentation

## 2018-03-23 DIAGNOSIS — F17200 Nicotine dependence, unspecified, uncomplicated: Secondary | ICD-10-CM | POA: Insufficient documentation

## 2018-03-23 DIAGNOSIS — J45909 Unspecified asthma, uncomplicated: Secondary | ICD-10-CM | POA: Insufficient documentation

## 2018-03-23 DIAGNOSIS — M25512 Pain in left shoulder: Secondary | ICD-10-CM | POA: Insufficient documentation

## 2018-03-23 DIAGNOSIS — Z79899 Other long term (current) drug therapy: Secondary | ICD-10-CM | POA: Insufficient documentation

## 2018-03-23 LAB — CBC
HCT: 42.9 % (ref 36.0–46.0)
Hemoglobin: 14.4 g/dL (ref 12.0–15.0)
MCH: 30.1 pg (ref 26.0–34.0)
MCHC: 33.6 g/dL (ref 30.0–36.0)
MCV: 89.7 fL (ref 80.0–100.0)
Platelets: 352 10*3/uL (ref 150–400)
RBC: 4.78 MIL/uL (ref 3.87–5.11)
RDW: 12 % (ref 11.5–15.5)
WBC: 13.9 10*3/uL — ABNORMAL HIGH (ref 4.0–10.5)
nRBC: 0 % (ref 0.0–0.2)

## 2018-03-23 LAB — BASIC METABOLIC PANEL
Anion gap: 6 (ref 5–15)
BUN: 12 mg/dL (ref 6–20)
CO2: 26 mmol/L (ref 22–32)
Calcium: 8.9 mg/dL (ref 8.9–10.3)
Chloride: 108 mmol/L (ref 98–111)
Creatinine, Ser: 0.61 mg/dL (ref 0.44–1.00)
GFR calc non Af Amer: >60 mL/min (ref >60)
Glucose, Bld: 105 mg/dL — ABNORMAL HIGH (ref 70–99)
Potassium: 3.5 mmol/L (ref 3.5–5.1)
Sodium: 140 mmol/L (ref 135–145)

## 2018-03-23 LAB — TROPONIN I: Troponin I: <0.03 ng/mL (ref <0.03)

## 2018-03-23 LAB — POCT PREGNANCY, URINE: PREG TEST UR: NEGATIVE

## 2018-03-23 NOTE — ED Triage Notes (Signed)
Patient c/o medial chest pain described as pressure/heaviness, dizziness. Patient reports pain radiates to right shoulder.

## 2018-03-24 ENCOUNTER — Emergency Department
Admission: EM | Admit: 2018-03-24 | Discharge: 2018-03-24 | Disposition: A | Discharge status: Home / Self Care | Payer: PRIVATE HEALTH INSURANCE | Attending: Emergency Medicine | Admitting: Emergency Medicine

## 2018-03-24 ENCOUNTER — Other Ambulatory Visit: Payer: Self-pay

## 2018-03-24 ENCOUNTER — Encounter: Payer: Self-pay | Admitting: Emergency Medicine

## 2018-03-24 DIAGNOSIS — R0789 Other chest pain: Secondary | ICD-10-CM

## 2018-03-24 DIAGNOSIS — M25519 Pain in unspecified shoulder: Secondary | ICD-10-CM

## 2018-03-24 HISTORY — DX: Panic disorder (episodic paroxysmal anxiety): F41.0

## 2018-03-24 NOTE — ED Notes (Signed)
Pt to the er due to being at work at 1pm and she felt like something was stuck in her throat. Pt thought she was having an allergic reaction to eating a whopper. Pt denies food allergies but states that sometimes when she eats onions she feels itchy. Pt states she didn't taste onions but she didn't look at the burger. Pt took 25mg  of benadryl which improved her symptoms. Pt says she made it through work. Pt went home and says she felt like there was something heavy on her chest. Pt ran a steam shower to help but it did not. Pt says she does have a cough but thinks that is due to her smoking habit.Pt says she came to the hospital because she doesn't have a primary md. Pt says her mom has heart problems. Pt reports pain to the right arm, right neck and shoulder. Pt has an albuterol inhaler but did not use it today. Pt has a hx of ADHD but does not take her meds or see psychiatry as she didn't feel it was helping and then lost her insurance. VSS.

## 2018-03-24 NOTE — ED Provider Notes (Signed)
[facility] Emergency Department Provider Note  ____________________________________________   First MD Initiated Contact with Patient 03/24/18 0109     (approximate)  I have reviewed the triage vital signs and the nursing notes.   HISTORY  Chief Complaint Chest Pain    HPI Suzanne Nelson is a 22 y.o. female whose medical history as listed below and notably includes anxiety, panic attacks, and asthma.  She presents by private vehicle for evaluation of a variety of complaints.  She reports that she was at work and ate a hamburger and afterwards she felt like something was stuck in her throat.  She was able to swallow and drink okay but she had her mom bring her some Benadryl.  She felt a little bit better afterwards but then she started feeling like she was having some trouble breathing and some right sided chest pain and pressure.  This persisted and she decided to come to the hospital for further evaluation.  The symptoms are now completely resolved although she says that she still has some pain in her left shoulder at times when she moves around.  Nothing in particular made the symptoms better or worse but they are now better.  She has not had any recent immobilizations nor surgeries and she does not take any hormones or birth control.  No recent long trips.  No history of blood clots in the legs nor the lungs.  She has no personal history of heart disease and her mother has heart failure but no first-degree relatives with heart attacks.  She smokes and her asthma is well controlled at baseline.  Her symptoms were severe and relatively acute in onset.  Past Medical History:  Diagnosis Date  . ADHD (attention deficit hyperactivity disorder)   . Anxiety   . Asthma   . Asthma   . Depression   . No pertinent past medical history   . Panic attack     Patient Active Problem List   Diagnosis Date Noted  . Hydronephrosis 05/18/2015  . Calculus of distal left  ureter 05/18/2015  . Left ureteral stone 05/18/2015  . Major depressive disorder, single episode, moderate (HCC) 03/16/2011  . 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 Oppositional defiant disorder 03/16/2011    Past Surgical History:  Procedure Laterality Date  . CYSTO N/A 05/19/2015   Procedure: CYSTO;  Surgeon: Vanna Scotland, MD;  Location: [facility] ORS;  Service: Urology;  Laterality: N/A;  . NO PAST SURGERIES    . URETEROSCOPY WITH HOLMIUM LASER LITHOTRIPSY Left 05/19/2015   Procedure: URETEROSCOPY WITH HOLMIUM LASER LITHOTRIPSY;  Surgeon: Vanna Scotland, MD;  Location: [facility] ORS;  Service: Urology;  Laterality: Left;    Prior to Admission medications   Medication Sig Start Date End Date Taking? Authorizing Provider  oxybutynin (DITROPAN) 5 MG tablet Take 1 tablet (5 mg total) by mouth every 8 (eight) hours as needed for bladder spasms. 05/19/15   Vanna Scotland, MD  ranitidine (ZANTAC) 150 MG tablet Take 1 tablet (150 mg total) by mouth 2 (two) times daily for 5 days. 03/20/18 03/25/18  Menshew, Charlesetta Ivory, PA-C    Allergies Adhesive [tape]  Family History  Problem Relation Age of Onset  . Diabetes Mellitus II Mother   . Bipolar disorder Mother     Social History Social History   Tobacco Use  . Smoking status: Current Every Day Smoker  . Smokeless tobacco: Never Used  Substance Use Topics  . Alcohol use: No  .  Drug use: No    Frequency: 1.0 times per week    Types: Marijuana    Comment: Last use one month age    Review of Systems Constitutional: No fever/chills Eyes: No visual changes. ENT: No sore throat. Cardiovascular: Chest pain as described above, now resolved Respiratory: Mild shortness of breath as described above, now resolved Gastrointestinal: No abdominal pain.  No nausea, no vomiting.  No diarrhea.  No constipation. Genitourinary: Negative for dysuria. Musculoskeletal: Pain in bilateral shoulders, worse on the left, now  mostly resolved Integumentary: Negative for rash. Neurological: Negative for headaches, focal weakness or numbness.   ____________________________________________   PHYSICAL EXAM:  VITAL SIGNS: ED Triage Vitals  Enc Vitals Group     BP 03/23/18 2113 121/76     Pulse Rate 03/23/18 2113 89     Resp 03/23/18 2113 19     Temp 03/23/18 2113 97.9 F (36.6 C)     Temp Source 03/23/18 2113 Oral     SpO2 03/23/18 2113 99 %     Weight 03/23/18 2111 60 kg (132 lb 4.4 oz)     Height 03/24/18 0047 1.575 m (5\' 2" )     Head Circumference --      Peak Flow --      Pain Score 03/23/18 2111 5     Pain Loc --      Pain Edu? --      Excl. in GC? --     Constitutional: Alert and oriented. Well appearing and in no acute distress. Eyes: Conjunctivae are normal.  Head: Atraumatic. Nose: No congestion/rhinnorhea. Mouth/Throat: Mucous membranes are moist. Neck: No stridor.  No meningeal signs.   Cardiovascular: Normal rate, regular rhythm. Good peripheral circulation. Grossly normal heart sounds. Respiratory: Normal respiratory effort.  No retractions. Lungs CTAB. Gastrointestinal: Soft and nontender. No distention.  Musculoskeletal: No lower extremity tenderness nor edema. No gross deformities of extremities.  No reproducible shoulder pain with range of motion of either shoulder. Neurologic:  Normal speech and language. No gross focal neurologic deficits are appreciated.  Skin:  Skin is warm, dry and intact. No rash noted. Psychiatric: Mood and affect are normal. Speech and behavior are normal.  Calm and cooperative.  ____________________________________________   LABS (all labs ordered are listed, but only abnormal results are displayed)  Labs Reviewed  BASIC METABOLIC PANEL - Abnormal; Notable for the following components:      Result Value   Glucose, Bld 105 (*)    All other components within normal limits  CBC - Abnormal; Notable for the following components:   WBC 13.9 (*)    All  other components within normal limits  TROPONIN I  POC URINE PREG, ED  POCT PREGNANCY, URINE   ____________________________________________  EKG  ED ECG REPORT I, Loleta Rose, the attending physician, personally viewed and interpreted this ECG.  Date: 03/23/2018 EKG Time: 21:14 Rate: 99 Rhythm: normal sinus rhythm QRS Axis: normal Intervals: normal ST/T Wave abnormalities: Non-specific ST segment / T-wave changes, but no evidence of acute ischemia. Narrative Interpretation: no evidence of acute ischemia   ____________________________________________  RADIOLOGY   ED MD interpretation: No indication of acute intrathoracic abnormality  Official radiology report(s): Dg Chest 2 View  Result Date: 03/23/2018 CLINICAL DATA:  Chest pain EXAM: CHEST - 2 VIEW COMPARISON:  02/09/2017 FINDINGS: Heart and mediastinal contours are within normal limits. No focal opacities or effusions. No acute bony abnormality. IMPRESSION: No active cardiopulmonary disease. Electronically Signed   By: Charlett Nose M.D.  On: 03/23/2018 21:35    ____________________________________________   PROCEDURES  Critical Care performed: No   Procedure(s) performed:   Procedures   ____________________________________________   INITIAL IMPRESSION / ASSESSMENT AND PLAN / ED COURSE  As part of my medical decision making, I reviewed the following data within the electronic MEDICAL RECORD NUMBER Nursing notes reviewed and incorporated, Labs reviewed , EKG interpreted , Old chart reviewed, Radiograph reviewed  and Notes from prior ED visits    Differential diagnosis includes, but is not limited to, panic/anxiety attack, allergic reaction, much less likely PE, ACS, pneumonia.  The patient is at her baseline currently and has no symptoms or distress.  I think that she had some sort of episode occurred as result of eating too fast, possibly getting something stuck in her throat, and then she had an anxiety  reaction afterwards.  She agrees that this may very well be possible.  She is PERC negative and low risk for ACS based on her HEART score.  No indication for repeat  Lab work.  Her troponin is negative, urine pregnancy is negative, basic metabolic panel is negative, chest x-ray reassuring, EKG reassuring, and she has only a mild leukocytosis of unclear origin, likely a stress reaction to her episode.  She is comfortable with the plan for discharge and outpatient follow-up, as is her mother who is at bedside.  I gave my usual and customary return precautions.    ____________________________________________  FINAL CLINICAL IMPRESSION(S) / ED DIAGNOSES  Final diagnoses:  Atypical chest pain  Acute shoulder pain, unspecified laterality     MEDICATIONS GIVEN DURING THIS VISIT:  Medications - No data to display   ED Discharge Orders    None       Note:  This document was prepared using Dragon voice recognition software and may include unintentional dictation errors.    Loleta RoseForbach, Billye Pickerel, MD 03/24/18 [phone]

## 2018-03-24 NOTE — Discharge Instructions (Signed)
You have been seen in the Emergency Department (ED) today for chest pain.  As we have discussed today?s test results are normal, and we feel it is likely that panic attacks may be causing your symptoms. ° °Please follow up with the recommended doctor as instructed above in these documents regarding today?s emergent visit and your recent symptoms to discuss further management.  Continue to take your regular medications.  ° °Return to the Emergency Department (ED) if you experience any further chest pain/pressure/tightness, difficulty breathing, or sudden sweating, or other symptoms that concern you. °

## 2018-05-22 ENCOUNTER — Emergency Department
Admission: EM | Admit: 2018-05-22 | Discharge: 2018-05-22 | Disposition: A | Discharge status: Home / Self Care | Payer: PRIVATE HEALTH INSURANCE | Attending: Student in an Organized Health Care Education/Training Program | Admitting: Student in an Organized Health Care Education/Training Program

## 2018-05-22 ENCOUNTER — Emergency Department: Admission: EM | Admit: 2018-05-22 | Discharge: 2018-05-22 | Discharge status: Absent without leave | Payer: Self-pay

## 2018-05-22 ENCOUNTER — Encounter: Payer: Self-pay | Admitting: Emergency Medicine

## 2018-05-22 ENCOUNTER — Other Ambulatory Visit: Payer: Self-pay

## 2018-05-22 DIAGNOSIS — F1210 Cannabis abuse, uncomplicated: Secondary | ICD-10-CM | POA: Insufficient documentation

## 2018-05-22 DIAGNOSIS — J111 Influenza due to unidentified influenza virus with other respiratory manifestations: Secondary | ICD-10-CM | POA: Insufficient documentation

## 2018-05-22 DIAGNOSIS — R0981 Nasal congestion: Secondary | ICD-10-CM | POA: Insufficient documentation

## 2018-05-22 DIAGNOSIS — R05 Cough: Secondary | ICD-10-CM | POA: Insufficient documentation

## 2018-05-22 DIAGNOSIS — F17200 Nicotine dependence, unspecified, uncomplicated: Secondary | ICD-10-CM | POA: Insufficient documentation

## 2018-05-22 DIAGNOSIS — J45909 Unspecified asthma, uncomplicated: Secondary | ICD-10-CM | POA: Insufficient documentation

## 2018-05-22 DIAGNOSIS — M791 Myalgia, unspecified site: Secondary | ICD-10-CM | POA: Insufficient documentation

## 2018-05-22 MED ORDER — IBUPROFEN 600 MG PO TABS: 600.0000 mg | ORAL_TABLET | Freq: Four times a day (QID) | ORAL | 0 refills | Status: DC | PRN

## 2018-05-22 MED ORDER — ALBUTEROL SULFATE (2.5 MG/3ML) 0.083% IN NEBU
3.0000 mL | INHALATION_SOLUTION | Freq: Once | RESPIRATORY_TRACT | Status: AC
  Administered 2018-05-22: 3 mL via RESPIRATORY_TRACT

## 2018-05-22 MED ORDER — IPRATROPIUM-ALBUTEROL 0.5-2.5 (3) MG/3ML IN SOLN
3.0000 mL | Freq: Once | RESPIRATORY_TRACT | Status: AC
  Administered 2018-05-22: 3 mL via RESPIRATORY_TRACT
  Filled 2018-05-22: qty 3

## 2018-05-22 MED ORDER — OSELTAMIVIR PHOSPHATE 75 MG PO CAPS: 75.0000 mg | ORAL_CAPSULE | Freq: Two times a day (BID) | ORAL | 0 refills | Status: AC

## 2018-05-22 MED ORDER — ALBUTEROL SULFATE HFA 108 (90 BASE) MCG/ACT IN AERS: 2.0000 | INHALATION_SPRAY | Freq: Four times a day (QID) | RESPIRATORY_TRACT | 0 refills | Status: DC | PRN

## 2018-05-22 MED ORDER — ACETAMINOPHEN 500 MG PO TABS: 500.0000 mg | ORAL_TABLET | Freq: Four times a day (QID) | ORAL | 0 refills | Status: DC | PRN

## 2018-05-22 MED ORDER — BENZONATATE 100 MG PO CAPS: 100.0000 mg | ORAL_CAPSULE | Freq: Three times a day (TID) | ORAL | 0 refills | Status: DC | PRN

## 2018-05-22 MED ORDER — IBUPROFEN 600 MG PO TABS
600.0000 mg | ORAL_TABLET | Freq: Once | ORAL | Status: AC
  Administered 2018-05-22: 600 mg via ORAL
  Filled 2018-05-22: qty 1

## 2018-05-22 NOTE — ED Provider Notes (Signed)
[facility] Emergency Department Provider Note  ____________________________________________  Time seen: Approximately 8:52 AM  I have reviewed the triage vital signs and the nursing notes.   HISTORY  Chief Complaint Fever and Generalized Body Aches    HPI Suzanne Nelson is a 23 y.o. female that presents to the emergency department for evaluation of fever, chills, body aches, nasal congestion, sore throat, occasionally productive cough for 2 to 3 days.  Patient states that every bone in her body hurts.  Patient has a history of asthma and has not had the money to refill her inhaler.  Patient smokes daily cigarettes.  No vomiting, diarrhea.   Past Medical History:  Diagnosis Date  . ADHD (attention deficit hyperactivity disorder)   . Anxiety   . Asthma   . Asthma   . Depression   . No pertinent past medical history   . Panic attack     Patient Active Problem List   Diagnosis Date Noted  . Hydronephrosis 05/18/2015  . Calculus of distal left ureter 05/18/2015  . Left ureteral stone 05/18/2015  . Major depressive disorder, single episode, moderate (HCC) 03/16/2011  . 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 Oppositional defiant disorder 03/16/2011    Past Surgical History:  Procedure Laterality Date  . CYSTO N/A 05/19/2015   Procedure: CYSTO;  Surgeon: Vanna Scotland, MD;  Location: [facility] ORS;  Service: Urology;  Laterality: N/A;  . NO PAST SURGERIES    . URETEROSCOPY WITH HOLMIUM LASER LITHOTRIPSY Left 05/19/2015   Procedure: URETEROSCOPY WITH HOLMIUM LASER LITHOTRIPSY;  Surgeon: Vanna Scotland, MD;  Location: [facility] ORS;  Service: Urology;  Laterality: Left;    Prior to Admission medications   Medication Sig Start Date End Date Taking? Authorizing Provider  acetaminophen (TYLENOL) 500 MG tablet Take 1 tablet (500 mg total) by mouth every 6 (six) hours as needed. 05/22/18   Enid Derry, PA-C  albuterol (PROVENTIL  HFA;VENTOLIN HFA) 108 (90 Base) MCG/ACT inhaler Inhale 2 puffs into the lungs every 6 (six) hours as needed for wheezing or shortness of breath. 05/22/18   Enid Derry, PA-C  benzonatate (TESSALON PERLES) 100 MG capsule Take 1 capsule (100 mg total) by mouth 3 (three) times daily as needed for cough. 05/22/18 05/22/19  Enid Derry, PA-C  ibuprofen (ADVIL,MOTRIN) 600 MG tablet Take 1 tablet (600 mg total) by mouth every 6 (six) hours as needed. 05/22/18   Enid Derry, PA-C  oseltamivir (TAMIFLU) 75 MG capsule Take 1 capsule (75 mg total) by mouth 2 (two) times daily for 5 days. 05/22/18 05/27/18  Enid Derry, PA-C  oxybutynin (DITROPAN) 5 MG tablet Take 1 tablet (5 mg total) by mouth every 8 (eight) hours as needed for bladder spasms. 05/19/15   Vanna Scotland, MD  ranitidine (ZANTAC) 150 MG tablet Take 1 tablet (150 mg total) by mouth 2 (two) times daily for 5 days. 03/20/18 03/25/18  Menshew, Charlesetta Ivory, PA-C    Allergies Adhesive [tape]  Family History  Problem Relation Age of Onset  . Diabetes Mellitus II Mother   . Bipolar disorder Mother     Social History Social History   Tobacco Use  . Smoking status: Current Every Day Smoker  . Smokeless tobacco: Never Used  Substance Use Topics  . Alcohol use: No  . Drug use: No    Frequency: 1.0 times per week    Types: Marijuana    Comment: Last use one month age     Review of Systems  Constitutional: Positive for fever. Eyes: No visual changes. No discharge. ENT: Positive for congestion and rhinorrhea.  Positive for sore throat. Respiratory: Positive for cough. No SOB. Gastrointestinal: No abdominal pain.  No nausea, no vomiting.  No diarrhea.  No constipation. Musculoskeletal: Positive for body aches. Skin: Negative for rash, abrasions, lacerations, ecchymosis. Neurological: Negative for headaches.   ____________________________________________   PHYSICAL EXAM:  VITAL SIGNS: ED Triage Vitals  Enc Vitals Group     BP  05/22/18 0749 109/62     Pulse Rate 05/22/18 0749 (!) 105     Resp 05/22/18 0749 16     Temp 05/22/18 0749 99.5 F (37.5 C)     Temp Source 05/22/18 0749 Oral     SpO2 05/22/18 0749 98 %     Weight 05/22/18 0748 131 lb (59.4 kg)     Height 05/22/18 0748 5\' 2"  (1.575 m)     Head Circumference --      Peak Flow --      Pain Score 05/22/18 0747 8     Pain Loc --      Pain Edu? --      Excl. in GC? --      Constitutional: Alert and oriented. Well appearing and in no acute distress. Eyes: Conjunctivae are normal. PERRL. EOMI. No discharge. Head: Atraumatic. ENT: No frontal and maxillary sinus tenderness.      Ears: Tympanic membranes pearly gray with good landmarks. No discharge.      Nose: Mild congestion/rhinnorhea.      Mouth/Throat: Mucous membranes are moist. Oropharynx non-erythematous. Tonsils not enlarged. No exudates. Uvula midline. Neck: No stridor.   Hematological/Lymphatic/Immunilogical: No cervical lymphadenopathy. Cardiovascular: Normal rate, regular rhythm.  Good peripheral circulation. Respiratory: Normal respiratory effort without tachypnea or retractions. Lungs CTAB. Good air entry to the bases with no decreased or absent breath sounds. Gastrointestinal: Bowel sounds 4 quadrants. Soft and nontender to palpation. No guarding or rigidity. No palpable masses. No distention. Musculoskeletal: Full range of motion to all extremities. No gross deformities appreciated. Neurologic:  Normal speech and language. No gross focal neurologic deficits are appreciated.  Skin:  Skin is warm, dry and intact. No rash noted. Psychiatric: Mood and affect are normal. Speech and behavior are normal. Patient exhibits appropriate insight and judgement.   ____________________________________________   LABS (all labs ordered are listed, but only abnormal results are displayed)  Labs Reviewed - No data to  display ____________________________________________  EKG   ____________________________________________  RADIOLOGY   No results found.  ____________________________________________    PROCEDURES  Procedure(s) performed:    Procedures    Medications  ipratropium-albuterol (DUONEB) 0.5-2.5 (3) MG/3ML nebulizer solution 3 mL (3 mLs Nebulization Given 05/22/18 0909)  albuterol (PROVENTIL) (2.5 MG/3ML) 0.083% nebulizer solution 3 mL (3 mLs Inhalation Given 05/22/18 0933)  ibuprofen (ADVIL,MOTRIN) tablet 600 mg (600 mg Oral Given 05/22/18 0909)     ____________________________________________   INITIAL IMPRESSION / ASSESSMENT AND PLAN / ED COURSE  Pertinent labs & imaging results that were available during my care of the patient were reviewed by me and considered in my medical decision making (see chart for details).  Review of the [place] CSRS was performed in accordance of the [facility] prior to dispensing any controlled drugs.   Patient's diagnosis is consistent with influenza. Vital signs and exam are reassuring. Patient appears well and is staying well hydrated. Patient should alternate tylenol and ibuprofen for fever. Patient feels comfortable going home. Patient will be discharged home with prescriptions for Tamiflu,  albuterol inhaler, tessalon perles, tylenol, motrin. Patient is to follow up with PCP as needed or otherwise directed. Patient is given ED precautions to return to the ED for any worsening or new symptoms.     ____________________________________________  FINAL CLINICAL IMPRESSION(S) / ED DIAGNOSES  Final diagnoses:  Influenza      NEW MEDICATIONS STARTED DURING THIS VISIT:  ED Discharge Orders         Ordered    oseltamivir (TAMIFLU) 75 MG capsule  2 times daily     05/22/18 0929    albuterol (PROVENTIL HFA;VENTOLIN HFA) 108 (90 Base) MCG/ACT inhaler  Every 6 hours PRN     05/22/18 0929    benzonatate (TESSALON PERLES) 100 MG capsule  3 times daily  PRN     05/22/18 0929    acetaminophen (TYLENOL) 500 MG tablet  Every 6 hours PRN     05/22/18 0929    ibuprofen (ADVIL,MOTRIN) 600 MG tablet  Every 6 hours PRN     05/22/18 0929              This chart was dictated using voice recognition software/Dragon. Despite best efforts to proofread, errors can occur which can change the meaning. Any change was purely unintentional.    Enid DerryWagner, Cayetano Mikita, PA-C 05/22/18 1241    Willy Eddyobinson, Patrick, MD 05/22/18 1255

## 2018-05-22 NOTE — ED Triage Notes (Signed)
C/O body aches and fever.  States aches started last night. This morning she has a fever of 101.  Has not medicated for fever.

## 2018-05-22 NOTE — ED Notes (Signed)
See triage note  States she developed slight cough several days ago  Then noticed body aches last pm  Was febrile at home   Low grade temp noted on arrival

## 2018-10-05 ENCOUNTER — Emergency Department
Admission: EM | Admit: 2018-10-05 | Discharge: 2018-10-05 | Disposition: A | Discharge status: Home / Self Care | Payer: PRIVATE HEALTH INSURANCE | Attending: Emergency Medicine | Admitting: Emergency Medicine

## 2018-10-05 ENCOUNTER — Other Ambulatory Visit: Payer: Self-pay

## 2018-10-05 ENCOUNTER — Emergency Department: Payer: PRIVATE HEALTH INSURANCE

## 2018-10-05 DIAGNOSIS — F17210 Nicotine dependence, cigarettes, uncomplicated: Secondary | ICD-10-CM | POA: Insufficient documentation

## 2018-10-05 DIAGNOSIS — J45909 Unspecified asthma, uncomplicated: Secondary | ICD-10-CM | POA: Insufficient documentation

## 2018-10-05 DIAGNOSIS — S93601A Unspecified sprain of right foot, initial encounter: Secondary | ICD-10-CM

## 2018-10-05 DIAGNOSIS — W19XXXA Unspecified fall, initial encounter: Secondary | ICD-10-CM | POA: Insufficient documentation

## 2018-10-05 DIAGNOSIS — Z79899 Other long term (current) drug therapy: Secondary | ICD-10-CM | POA: Insufficient documentation

## 2018-10-05 DIAGNOSIS — S93401A Sprain of unspecified ligament of right ankle, initial encounter: Secondary | ICD-10-CM | POA: Insufficient documentation

## 2018-10-05 DIAGNOSIS — Y939 Activity, unspecified: Secondary | ICD-10-CM | POA: Insufficient documentation

## 2018-10-05 DIAGNOSIS — Y999 Unspecified external cause status: Secondary | ICD-10-CM | POA: Insufficient documentation

## 2018-10-05 DIAGNOSIS — Y929 Unspecified place or not applicable: Secondary | ICD-10-CM | POA: Insufficient documentation

## 2018-10-05 MED ORDER — TRAMADOL HCL 50 MG PO TABS
50.0000 mg | ORAL_TABLET | Freq: Once | ORAL | Status: AC
  Administered 2018-10-05: 50 mg via ORAL
  Filled 2018-10-05: qty 1

## 2018-10-05 MED ORDER — IBUPROFEN 600 MG PO TABS: 600.0000 mg | ORAL_TABLET | Freq: Three times a day (TID) | ORAL | 0 refills | Status: DC | PRN

## 2018-10-05 MED ORDER — IBUPROFEN 600 MG PO TABS
600.0000 mg | ORAL_TABLET | Freq: Once | ORAL | Status: AC
  Administered 2018-10-05: 600 mg via ORAL
  Filled 2018-10-05: qty 1

## 2018-10-05 MED ORDER — TRAMADOL HCL 50 MG PO TABS: 50.0000 mg | ORAL_TABLET | Freq: Four times a day (QID) | ORAL | 0 refills | Status: AC | PRN

## 2018-10-05 NOTE — ED Notes (Signed)
R foot/ankle pain per pt upon dorsiflexion; pt can move foot in all directions, wiggle toes, appropriate color/warmth/pulses equal bilaterally.

## 2018-10-05 NOTE — ED Provider Notes (Signed)
[facility] Emergency Department Provider Note   ____________________________________________   None    (approximate)  I have reviewed the triage vital signs and the nursing notes.   HISTORY  Chief Complaint Ankle Pain    HPI Suzanne Nelson is a 23 y.o. female patient clinically right ankle pain secondary to a fall.  Patient date pain is mostly on the dorsal aspect of the midfoot.  Patient did pain increased with ambulation.  Patient rates pain as a 5/10.  Patient scribed pain is "aching".  No palliative measure for complaint.         Past Medical History:  Diagnosis Date  . ADHD (attention deficit hyperactivity disorder)   . Anxiety   . Asthma   . Asthma   . Depression   . No pertinent past medical history   . Panic attack     Patient Active Problem List   Diagnosis Date Noted  . Hydronephrosis 05/18/2015  . Calculus of distal left ureter 05/18/2015  . Left ureteral stone 05/18/2015  . Major depressive disorder, single episode, moderate (HCC) 03/16/2011  . 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 Oppositional defiant disorder 03/16/2011    Past Surgical History:  Procedure Laterality Date  . CYSTO N/A 05/19/2015   Procedure: CYSTO;  Surgeon: Vanna ScotlandAshley Brandon, MD;  Location: [facility] ORS;  Service: Urology;  Laterality: N/A;  . NO PAST SURGERIES    . URETEROSCOPY WITH HOLMIUM LASER LITHOTRIPSY Left 05/19/2015   Procedure: URETEROSCOPY WITH HOLMIUM LASER LITHOTRIPSY;  Surgeon: Vanna ScotlandAshley Brandon, MD;  Location: [facility] ORS;  Service: Urology;  Laterality: Left;    Prior to Admission medications   Medication Sig Start Date End Date Taking? Authorizing Provider  acetaminophen (TYLENOL) 500 MG tablet Take 1 tablet (500 mg total) by mouth every 6 (six) hours as needed. 05/22/18   Enid DerryWagner, Ashley, PA-C  albuterol (PROVENTIL HFA;VENTOLIN HFA) 108 (90 Base) MCG/ACT inhaler Inhale 2 puffs into the lungs every 6 (six) hours as  needed for wheezing or shortness of breath. 05/22/18   Enid DerryWagner, Ashley, PA-C  benzonatate (TESSALON PERLES) 100 MG capsule Take 1 capsule (100 mg total) by mouth 3 (three) times daily as needed for cough. 05/22/18 05/22/19  Enid DerryWagner, Ashley, PA-C  ibuprofen (ADVIL) 600 MG tablet Take 1 tablet (600 mg total) by mouth every 8 (eight) hours as needed. 10/05/18   Joni ReiningSmith, Getsemani Lindon K, PA-C  ibuprofen (ADVIL,MOTRIN) 600 MG tablet Take 1 tablet (600 mg total) by mouth every 6 (six) hours as needed. 05/22/18   Enid DerryWagner, Ashley, PA-C  oxybutynin (DITROPAN) 5 MG tablet Take 1 tablet (5 mg total) by mouth every 8 (eight) hours as needed for bladder spasms. 05/19/15   Vanna ScotlandBrandon, Ashley, MD  ranitidine (ZANTAC) 150 MG tablet Take 1 tablet (150 mg total) by mouth 2 (two) times daily for 5 days. 03/20/18 03/25/18  Menshew, Charlesetta IvoryJenise V Bacon, PA-C  traMADol (ULTRAM) 50 MG tablet Take 1 tablet (50 mg total) by mouth every 6 (six) hours as needed for up to 3 days. 10/05/18 10/08/18  Joni ReiningSmith, Bentlie Withem K, PA-C    Allergies Adhesive [tape]  Family History  Problem Relation Age of Onset  . Diabetes Mellitus II Mother   . Bipolar disorder Mother     Social History Social History   Tobacco Use  . Smoking status: Current Every Day Smoker  . Smokeless tobacco: Never Used  Substance Use Topics  . Alcohol use: Yes  . Drug use: No    Frequency:  1.0 times per week    Types: Marijuana    Comment: Last use one month age    Review of Systems  Constitutional: No fever/chills Eyes: No visual changes. ENT: No sore throat. Cardiovascular: Denies chest pain. Respiratory: Denies shortness of breath. Gastrointestinal: No abdominal pain.  No nausea, no vomiting.  No diarrhea.  No constipation. Genitourinary: Negative for dysuria. Musculoskeletal: Right ankle pain.   Skin: Negative for rash. Neurological: Negative for headaches, focal weakness or numbness. Psychiatric:  ADHD and anxiety. Allergic/Immunilogical: Tape.  ____________________________________________   PHYSICAL EXAM:  VITAL SIGNS: ED Triage Vitals [10/05/18 2150]  Enc Vitals Group     BP      Pulse      Resp      Temp      Temp src      SpO2      Weight 188 lb (85.3 kg)     Height 5\' 2"  (1.575 m)     Head Circumference      Peak Flow      Pain Score 5     Pain Loc      Pain Edu?      Excl. in [place]?     Constitutional: Alert and oriented. Well appearing and in no acute distress. Cardiovascular: Normal rate, regular rhythm. Grossly normal heart sounds.  Good peripheral circulation. Respiratory: Normal respiratory effort.  No retractions. Lungs CTAB. Musculoskeletal: No obvious deformity to the right foot/ankle.  Patient is moderate guarding palpation dorsal aspect of the right foot.  Patient has full back range of motion.  Patient ambulates with atypical gait.   Neurologic:  Normal speech and language. No gross focal neurologic deficits are appreciated. No gait instability. Skin:  Skin is warm, dry and intact. No rash noted. Psychiatric: Mood and affect are normal. Speech and behavior are normal.  ____________________________________________   LABS (all labs ordered are listed, but only abnormal results are displayed)  Labs Reviewed - No data to display ____________________________________________  EKG   ____________________________________________  RADIOLOGY  ED MD interpretation:    Official radiology report(s): Dg Ankle Complete Right  Result Date: 10/05/2018 CLINICAL DATA:  Right ankle pain EXAM: RIGHT ANKLE - COMPLETE 3+ VIEW COMPARISON:  None. FINDINGS: There is no evidence of fracture, dislocation, or joint effusion. There is no evidence of arthropathy or other focal bone abnormality. Soft tissues are unremarkable. IMPRESSION: Negative. Electronically Signed   By: Rolm Baptise M.D.   On: 10/05/2018 22:11    ____________________________________________   PROCEDURES  Procedure(s) performed (including  Critical Care):  Procedures   ____________________________________________   INITIAL IMPRESSION / ASSESSMENT AND PLAN / ED COURSE  As part of my medical decision making, I reviewed the following data within the [place]         Patient presents with right ankle/foot pain secondary to a fall.  Discussed x-ray findings with patient.  Patient complains consistent for sprain foot.  Patient given discharge care instruction.  Patient foot was Ace wrapped and she is given crutches for ambulation.  Patient advised [facility].  Take medication as directed.     ____________________________________________   FINAL CLINICAL IMPRESSION(S) / ED DIAGNOSES  Final diagnoses:  Sprain of right foot, initial encounter     ED Discharge Orders         Ordered    traMADol (ULTRAM) 50 MG tablet  Every 6 hours PRN     10/05/18 2227  ibuprofen (ADVIL) 600 MG tablet  Every 8 hours PRN     10/05/18 2227           Note:  This document was prepared using Dragon voice recognition software and may include unintentional dictation errors.    Joni ReiningSmith, [place] Bellemare K, PA-C 10/05/18 2231    Shaune PollackIsaacs, Cameron, MD 10/06/18 [phone]

## 2018-10-05 NOTE — ED Notes (Signed)
Injured ankle ace wrapped; pt educated on how to use crutches.

## 2018-10-05 NOTE — ED Triage Notes (Signed)
Patient c/o right ankle pain post mechanical fall.

## 2018-10-31 ENCOUNTER — Emergency Department: Payer: PRIVATE HEALTH INSURANCE

## 2018-10-31 ENCOUNTER — Emergency Department
Admission: EM | Admit: 2018-10-31 | Discharge: 2018-10-31 | Disposition: A | Discharge status: Home / Self Care | Payer: PRIVATE HEALTH INSURANCE | Attending: Emergency Medicine | Admitting: Emergency Medicine

## 2018-10-31 ENCOUNTER — Encounter: Payer: Self-pay | Admitting: *Deleted

## 2018-10-31 ENCOUNTER — Other Ambulatory Visit: Payer: Self-pay

## 2018-10-31 DIAGNOSIS — R059 Cough, unspecified: Secondary | ICD-10-CM

## 2018-10-31 DIAGNOSIS — J40 Bronchitis, not specified as acute or chronic: Secondary | ICD-10-CM | POA: Insufficient documentation

## 2018-10-31 DIAGNOSIS — R0602 Shortness of breath: Secondary | ICD-10-CM

## 2018-10-31 DIAGNOSIS — Z20828 Contact with and (suspected) exposure to other viral communicable diseases: Secondary | ICD-10-CM | POA: Diagnosis not present

## 2018-10-31 DIAGNOSIS — F17210 Nicotine dependence, cigarettes, uncomplicated: Secondary | ICD-10-CM | POA: Diagnosis not present

## 2018-10-31 DIAGNOSIS — R05 Cough: Secondary | ICD-10-CM | POA: Diagnosis present

## 2018-10-31 LAB — CBC
HCT: 42.9 % (ref 36.0–46.0)
Hemoglobin: 14.4 g/dL (ref 12.0–15.0)
MCH: 30.4 pg (ref 26.0–34.0)
MCHC: 33.6 g/dL (ref 30.0–36.0)
MCV: 90.5 fL (ref 80.0–100.0)
Platelets: 303 10*3/uL (ref 150–400)
RBC: 4.74 MIL/uL (ref 3.87–5.11)
RDW: 12.3 % (ref 11.5–15.5)
WBC: 10.4 10*3/uL (ref 4.0–10.5)
nRBC: 0 % (ref 0.0–0.2)

## 2018-10-31 LAB — BASIC METABOLIC PANEL
Anion gap: 7 (ref 5–15)
BUN: 10 mg/dL (ref 6–20)
CO2: 25 mmol/L (ref 22–32)
Calcium: 8.9 mg/dL (ref 8.9–10.3)
Chloride: 108 mmol/L (ref 98–111)
Creatinine, Ser: 0.46 mg/dL (ref 0.44–1.00)
GFR calc Af Amer: >60 mL/min (ref >60)
GFR calc non Af Amer: >60 mL/min (ref >60)
Glucose, Bld: 104 mg/dL — ABNORMAL HIGH (ref 70–99)
Potassium: 3.7 mmol/L (ref 3.5–5.1)
Sodium: 140 mmol/L (ref 135–145)

## 2018-10-31 LAB — TROPONIN I (HIGH SENSITIVITY): Troponin I (High Sensitivity): <2 ng/L (ref <18)

## 2018-10-31 MED ORDER — ALBUTEROL SULFATE HFA 108 (90 BASE) MCG/ACT IN AERS: 2.0000 | INHALATION_SPRAY | Freq: Four times a day (QID) | RESPIRATORY_TRACT | 0 refills | Status: DC | PRN

## 2018-10-31 MED ORDER — AZITHROMYCIN 250 MG PO TABS: ORAL_TABLET | ORAL | 0 refills | Status: AC

## 2018-10-31 NOTE — Discharge Instructions (Addendum)
We did the coronavirus test today.  It is a send out test.  Results should be ready tomorrow afternoon.  Please call and check.  Phone number should be [phone].  There will be a long message which you have to get through before you can get to somebody.  Occasionally the test will take longer than 24 hours to come back.  If this is the case you can call back in 2 days.  please quarantine yourself until either the test is negative or you are symptom-free for a week.  You can go outside in your yard or other private place where no one else is around.  If you are forced to go to the store or some other location make sure you are wearing a mask and try to enforce social distancing.  Return here if you get increasingly short of breath, have a higher fever or have any other new complaints.  Because you may just have a bronchitis and you are coughing up green phlegm I will give you a prescription for Zithromax 2 on the first day and 1 every day after that.  If it is just bronchitis this should help.  You can also use an inhaler 2 puffs 4 times a day if it helps.

## 2018-10-31 NOTE — ED Provider Notes (Signed)
[facility] Emergency Department Provider Note   ____________________________________________   First MD Initiated Contact with Patient 10/31/18 1456     (approximate)  I have reviewed the triage vital signs and the nursing notes.   HISTORY  Chief Complaint Cough and Chest Pain    HPI Suzanne Nelson is a 23 y.o. female patient with cough sore throat and some chest tightness starting last night.  Temperature of 100 at home.  Known exposure to COVID from a coworker.  Patient also has a mild headache.  Patient is quarantining herself at home.  Only past medical history is asthma.  Cough is occasionally productive of some green phlegm.         Past Medical History:  Diagnosis Date  . Asthma     There are no active problems to display for this patient.   History reviewed. No pertinent surgical history.  Prior to Admission medications   Not on File    Allergies Onion and Tape  History reviewed. No pertinent family history.  Social History Social History   Tobacco Use  . Smoking status: Current Every Day Smoker    Packs/day: 1.00    Types: Cigarettes  . Smokeless tobacco: Never Used  Substance Use Topics  . Alcohol use: Yes    Comment: socially  . Drug use: Not Currently    Review of Systems  Constitutional: Low-grade fever Eyes: No visual changes. ENT: Mild sore throat. Cardiovascular: See HPI Respiratory: Denies shortness of breath. Gastrointestinal: No abdominal pain.  No nausea, no vomiting.  No diarrhea.  No constipation. Genitourinary: Negative for dysuria. Musculoskeletal: Negative for back pain. Skin: Negative for rash. Neurological: Negative for headaches, focal weakness   ____________________________________________   PHYSICAL EXAM:  VITAL SIGNS: ED Triage Vitals [10/31/18 1347]  Enc Vitals Group     BP      Pulse      Resp      Temp      Temp src      SpO2      Weight 180 lb (81.6 kg)     Height 5\' 2"   (1.575 m)     Head Circumference      Peak Flow      Pain Score 5     Pain Loc      Pain Edu?      Excl. in [place]?     Constitutional: Alert and oriented. Well appearing and in no acute distress. Eyes: Conjunctivae are normal. PERRL. EOMI. Head: Atraumatic. Nose: No congestion/rhinnorhea. Mouth/Throat: Mucous membranes are moist.  Oropharynx non-erythematous. Neck: No stridor. Cardiovascular: Normal rate, regular rhythm. Grossly normal heart sounds.  Good peripheral circulation. Respiratory: Normal respiratory effort.  No retractions. Lungs CTAB. Gastrointestinal: Soft and nontender. No distention. No abdominal bruits. No CVA tenderness. Musculoskeletal: No lower extremity tenderness nor edema.  Neurologic:  Normal speech and language. No gross focal neurologic deficits are appreciated. Skin:  Skin is warm, dry and intact. No rash noted.   ____________________________________________   LABS (all labs ordered are listed, but only abnormal results are displayed)  Labs Reviewed  BASIC METABOLIC PANEL - Abnormal; Notable for the following components:      Result Value   Glucose, Bld 104 (*)    All other components within normal limits  NOVEL CORONAVIRUS, NAA (HOSPITAL ORDER, SEND-OUT TO REF LAB)  CBC  POC URINE PREG, ED  TROPONIN I (HIGH SENSITIVITY)   ____________________________________________  EKG EKG read and interpreted by me shows normal  sinus rhythm rate of 94 normal axis nonspecific ST-T wave changes ____________________________________________  RADIOLOGY  ED MD interpretation: Chest x-ray read by radiology reviewed by me shows some increased interstitial markings felt to be likely due to smoking.  Official radiology report(s): Dg Chest Port 1 View  Result Date: 10/31/2018 CLINICAL DATA:  Cough, sore throat, and chest pressure. EXAM: PORTABLE CHEST 1 VIEW COMPARISON:  Chest x-ray dated March 23, 2018. FINDINGS: The heart size and mediastinal contours are within  normal limits. Normal pulmonary vascularity. Mildly coarsened interstitial markings are likely smoking-related. No focal consolidation, pleural effusion, or pneumothorax. No acute osseous abnormality. IMPRESSION: No active disease. Electronically Signed   By: Obie DredgeWilliam T Derry M.D.   On: 10/31/2018 14:46    ____________________________________________   PROCEDURES  Procedure(s) performed (including Critical Care):  Procedures   ____________________________________________   INITIAL IMPRESSION / ASSESSMENT AND PLAN / ED COURSE     Patient with symptoms suggestive of bronchitis or COVID.  She is non-hypoxic and in no distress.  We will let her go home and quarantine herself at home.  We will give her a work note.  COVID test is a send out she will have to call tomorrow for report.    O2 sats 99% heart rate 75.          ____________________________________________   FINAL CLINICAL IMPRESSION(S) / ED DIAGNOSES  Final diagnoses:  SOB (shortness of breath)     ED Discharge Orders    None       Note:  This document was prepared using Dragon voice recognition software and may include unintentional dictation errors.    Arnaldo NatalMalinda, Paul F, MD 10/31/18 [phone]

## 2018-10-31 NOTE — ED Notes (Signed)
Pt reports being exposed to someone at work who tested positive for covid at work.  Pt reports intermittent fevers and cough with occ sob.   Pt also reports a headache.   cig smoker.  Pt alert  Speech clear.

## 2018-10-31 NOTE — ED Triage Notes (Addendum)
Pt to ED with cough, sore throat and chest pressure starting last night. Temp at home of 100 and a known exposure to [place]. Intermittent nausea and worsening headache.   Hx of asthma.

## 2018-11-01 ENCOUNTER — Telehealth: Payer: Self-pay | Admitting: Emergency Medicine

## 2018-11-01 LAB — NOVEL CORONAVIRUS, NAA (HOSP ORDER, SEND-OUT TO REF LAB; TAT 18-24 HRS): SARS-CoV-2, NAA: NOT DETECTED

## 2018-11-01 NOTE — Telephone Encounter (Signed)
Patient called me back and I gave her result of covid 19 tst.

## 2018-11-01 NOTE — Telephone Encounter (Signed)
Called patient to inform of negitive covid result.  Left message.

## 2019-02-06 ENCOUNTER — Emergency Department
Admission: EM | Admit: 2019-02-06 | Discharge: 2019-02-06 | Disposition: A | Discharge status: Home / Self Care | Payer: Self-pay | Attending: Emergency Medicine | Admitting: Emergency Medicine

## 2019-02-06 ENCOUNTER — Other Ambulatory Visit: Payer: Self-pay

## 2019-02-06 ENCOUNTER — Emergency Department: Payer: Self-pay

## 2019-02-06 DIAGNOSIS — M62830 Muscle spasm of back: Secondary | ICD-10-CM | POA: Insufficient documentation

## 2019-02-06 DIAGNOSIS — J45909 Unspecified asthma, uncomplicated: Secondary | ICD-10-CM | POA: Insufficient documentation

## 2019-02-06 DIAGNOSIS — R300 Dysuria: Secondary | ICD-10-CM | POA: Insufficient documentation

## 2019-02-06 DIAGNOSIS — F17200 Nicotine dependence, unspecified, uncomplicated: Secondary | ICD-10-CM | POA: Insufficient documentation

## 2019-02-06 DIAGNOSIS — F1210 Cannabis abuse, uncomplicated: Secondary | ICD-10-CM | POA: Insufficient documentation

## 2019-02-06 LAB — URINALYSIS, COMPLETE (UACMP) WITH MICROSCOPIC
Bilirubin Urine: NEGATIVE
Glucose, UA: NEGATIVE mg/dL
Hgb urine dipstick: NEGATIVE
Ketones, ur: 20 mg/dL — AB
Nitrite: NEGATIVE
Protein, ur: 30 mg/dL — AB
Specific Gravity, Urine: 1.033 — ABNORMAL HIGH (ref 1.005–1.030)
pH: 5 (ref 5.0–8.0)

## 2019-02-06 LAB — POCT PREGNANCY, URINE: Preg Test, Ur: NEGATIVE

## 2019-02-06 MED ORDER — IBUPROFEN 600 MG PO TABS: 600.0000 mg | ORAL_TABLET | Freq: Four times a day (QID) | ORAL | 0 refills | Status: DC | PRN

## 2019-02-06 MED ORDER — CYCLOBENZAPRINE HCL 10 MG PO TABS: 10.0000 mg | ORAL_TABLET | Freq: Three times a day (TID) | ORAL | 0 refills | Status: DC | PRN

## 2019-02-06 MED ORDER — CYCLOBENZAPRINE HCL 10 MG PO TABS
10.0000 mg | ORAL_TABLET | Freq: Once | ORAL | Status: AC
  Administered 2019-02-06: 05:00:00 10 mg via ORAL
  Filled 2019-02-06: qty 1

## 2019-02-06 MED ORDER — KETOROLAC TROMETHAMINE 60 MG/2ML IM SOLN
30.0000 mg | Freq: Once | INTRAMUSCULAR | Status: AC
  Administered 2019-02-06: 06:00:00 30 mg via INTRAMUSCULAR
  Filled 2019-02-06: qty 2

## 2019-02-06 NOTE — ED Notes (Signed)
No answer when called several times from lobby 

## 2019-02-06 NOTE — Discharge Instructions (Signed)
You have been seen in the Emergency Department (ED)  today for back pain.  Back pain has many possible causes some are related to muscles while others have more serious causes. Even though you were checked carefully today and your exam and evaluation were reassuring, problems may develop later or continue to unfold. Therefore it is imperative that you follow up with doctor closely for further evaluation.  Follow-up with your doctor in 1 day for further evaluation.  For pain control: Apply heat, take 600 mg of ibuprofen every 6 hours as needed for pain with a meal.  Take Flexeril (muscle relaxant) as needed.  Follow-up with primary care doctor in 3 days.  When should you call for help?  Call your doctor now or seek immediate medical care if:  You have new or worsening numbness in your legs.  You have new or worsening weakness in your legs. (This could make it hard to stand up.)  You lose control of your bladder or bowels or if you are unable to urinate. You have numbness of your groin or buttock region If you develop a fever  Watch closely for changes in your health, and be sure to contact your doctor if:  Your pain gets worse.  You are not getting better after 2 weeks.  How can you care for yourself at home?  Take pain medicines exactly as directed.  If the doctor gave you a prescription medicine for pain, take it as prescribed.  If you are not taking a prescription pain medicine, ask your doctor if you can take an over-the-counter medicine like tylenol or ibuprofen. Sit or lie in positions that are most comfortable and reduce your pain. Try one of these positions when you lie down:  Lie on your back with your knees bent and supported by large pillows.  Lie on the floor with your legs on the seat of a sofa or chair.  Lie on your side with your knees and hips bent and a pillow between your legs.  Lie on your stomach if it does not make pain worse. Do not sit up in bed, and avoid soft  couches and twisted positions. Bed rest can help relieve pain at first, but it delays healing. Avoid bed rest after the first day of back pain.  Change positions every 30 minutes. If you must sit for long periods of time, take breaks from sitting. Get up and walk around, or lie in a comfortable position.  Try using a heating pad on a low or medium setting for 15 to 20 minutes every 2 or 3 hours. Try a warm shower in place of one session with the heating pad.  You can also try an ice pack for 10 to 15 minutes every 2 to 3 hours. Put a thin cloth between the ice pack and your skin.  Take short walks several times a day. You can start with 5 to 10 minutes, 3 or 4 times a day, and work up to longer walks. Walk on level surfaces and avoid hills and stairs until your back is better.  Return to work and other activities as soon as you can. Continued rest without activity is usually not good for your back.  To prevent future back pain, do exercises to stretch and strengthen your back and stomach. Learn how to use good posture, safe lifting techniques, and proper body mechanics.

## 2019-02-06 NOTE — ED Notes (Signed)
Pt to STAT desk to inquire over wait time; informed pt that she has been called several times already and will be taken back when exam room is available

## 2019-02-06 NOTE — ED Triage Notes (Signed)
Pt in with co mid back pain that started a few days ago while stretching. States feels like legs are numb at times, hx of muscle spasms but states this time symptoms are persistent.

## 2019-02-06 NOTE — ED Provider Notes (Signed)
[facility] Emergency Department Provider Note  ____________________________________________  Time seen: Approximately 5:46 AM  I have reviewed the triage vital signs and the nursing notes.   HISTORY  Chief Complaint Back Pain   HPI Suzanne Nelson is a 23 y.o. female with a history of chronic back pain, kidney stones, recurrent UTIs, depression, ADHD who presents for evaluation of back pain.  Patient reports that a couple days ago she was stretching  when she felt a sharp pain in her lower back.  She points to the left paraspinal region as the location of the pain.  She reports that the pain was severe and associated with numbness of her back and legs.  He lasted a few seconds and resolved without intervention.  The next day she was picking up her 53-month-old nephew and had a similar episode.  This morning she reports that the pain recurred when she stood up but then resolved again.  This evening while working third shift she reports that the pain became constant and severe.  The pain is sharp, located in the left paraspinal region.  She denies saddle anesthesia, lower extremity weakness or numbness, urinary incontinence or retention.  She does report mild dysuria for the last few days.  No abdominal pain, no IV drug use, no unintentional weight loss or night sweats.  No trauma to her back.  Patient reports history of chronic back pain and muscle spasms since she was 22 years old.  Past Medical History:  Diagnosis Date  . ADHD (attention deficit hyperactivity disorder)   . Anxiety   . Asthma   . Asthma   . Depression   . No pertinent past medical history   . Panic attack     Patient Active Problem List   Diagnosis Date Noted  . Hydronephrosis 05/18/2015  . Calculus of distal left ureter 05/18/2015  . Left ureteral stone 05/18/2015  . Major depressive disorder, single episode, moderate (HCC) 03/16/2011  . Attention deficit hyperactivity disorder, combined  type 03/16/2011  . Oppositional defiant disorder 03/16/2011    Past Surgical History:  Procedure Laterality Date  . CYSTO N/A 05/19/2015   Procedure: CYSTO;  Surgeon: Vanna Scotland, MD;  Location: [facility] ORS;  Service: Urology;  Laterality: N/A;  . NO PAST SURGERIES    . URETEROSCOPY WITH HOLMIUM LASER LITHOTRIPSY Left 05/19/2015   Procedure: URETEROSCOPY WITH HOLMIUM LASER LITHOTRIPSY;  Surgeon: Vanna Scotland, MD;  Location: [facility] ORS;  Service: Urology;  Laterality: Left;    Prior to Admission medications   Medication Sig Start Date End Date Taking? Authorizing Provider  acetaminophen (TYLENOL) 500 MG tablet Take 1 tablet (500 mg total) by mouth every 6 (six) hours as needed. 05/22/18   Enid Derry, PA-C  albuterol (PROVENTIL HFA;VENTOLIN HFA) 108 (90 Base) MCG/ACT inhaler Inhale 2 puffs into the lungs every 6 (six) hours as needed for wheezing or shortness of breath. 05/22/18   Enid Derry, PA-C  benzonatate (TESSALON PERLES) 100 MG capsule Take 1 capsule (100 mg total) by mouth 3 (three) times daily as needed for cough. 05/22/18 05/22/19  Enid Derry, PA-C  cyclobenzaprine (FLEXERIL) 10 MG tablet Take 1 tablet (10 mg total) by mouth 3 (three) times daily as needed for muscle spasms. 02/06/19   Nita Sickle, MD  ibuprofen (ADVIL) 600 MG tablet Take 1 tablet (600 mg total) by mouth every 6 (six) hours as needed. 02/06/19   Nita Sickle, MD  oxybutynin (DITROPAN) 5 MG tablet Take 1 tablet (5 mg  total) by mouth every 8 (eight) hours as needed for bladder spasms. 05/19/15   Hollice Espy, MD  ranitidine (ZANTAC) 150 MG tablet Take 1 tablet (150 mg total) by mouth 2 (two) times daily for 5 days. 03/20/18 03/25/18  Menshew, Dannielle Karvonen, PA-C    Allergies Adhesive [tape]  Family History  Problem Relation Age of Onset  . Diabetes Mellitus II Mother   . Bipolar disorder Mother     Social History Social History   Tobacco Use  . Smoking status: Current Every Day Smoker  .  Smokeless tobacco: Never Used  Substance Use Topics  . Alcohol use: Yes  . Drug use: No    Frequency: 1.0 times per week    Types: Marijuana    Comment: Last use one month age    Review of Systems  Constitutional: Negative for fever. Eyes: Negative for visual changes. ENT: Negative for sore throat. Neck: No neck pain  Cardiovascular: Negative for chest pain. Respiratory: Negative for shortness of breath. Gastrointestinal: Negative for abdominal pain, vomiting or diarrhea. Genitourinary: + dysuria. Musculoskeletal: + back pain. Skin: Negative for rash. Neurological: Negative for headaches, weakness or numbness. Psych: No SI or HI  ____________________________________________   PHYSICAL EXAM:  VITAL SIGNS: ED Triage Vitals [02/06/19 0028]  Enc Vitals Group     BP 116/74     Pulse Rate 79     Resp 20     Temp 97.8 F (36.6 C)     Temp Source Oral     SpO2 100 %     Weight 180 lb (81.6 kg)     Height 5\' 2"  (1.575 m)     Head Circumference      Peak Flow      Pain Score 7     Pain Loc      Pain Edu?      Excl. in [place]?     Constitutional: Alert and oriented. Well appearing and in no apparent distress. HEENT:      Head: Normocephalic and atraumatic.         Eyes: Conjunctivae are normal. Sclera is non-icteric.       Mouth/Throat: Mucous membranes are moist.       Neck: Supple with no signs of meningismus. Cardiovascular: Regular rate and rhythm. No murmurs, gallops, or rubs. 2+ symmetrical distal pulses are present in all extremities. No JVD. Respiratory: Normal respiratory effort. Lungs are clear to auscultation bilaterally. No wheezes, crackles, or rhonchi.  Gastrointestinal: Soft, non tender, and non distended with positive bowel sounds. No rebound or guarding. Genitourinary: No CVA tenderness. Musculoskeletal: No midline CTL spine tenderness.  Patient is tender to palpation of the left lumbar paraspinal region.   Neurologic: 2+ DTRs in bilateral lower  extremities with intact strength and sensation. Skin: Skin is warm, dry and intact. No rash noted. Psychiatric: Mood and affect are normal. Speech and behavior are normal.  ____________________________________________   LABS (all labs ordered are listed, but only abnormal results are displayed)  Labs Reviewed  URINALYSIS, COMPLETE (UACMP) WITH MICROSCOPIC - Abnormal; Notable for the following components:      Result Value   Color, Urine YELLOW (*)    APPearance HAZY (*)    Specific Gravity, Urine 1.033 (*)    Ketones, ur 20 (*)    Protein, ur 30 (*)    Leukocytes,Ua SMALL (*)    Bacteria, UA RARE (*)    All other components within normal limits  URINE CULTURE  POCT PREGNANCY,  URINE   ____________________________________________  EKG  none  ____________________________________________  RADIOLOGY  I have personally reviewed the images performed during this visit and I agree with the Radiologist's read.   Interpretation by Radiologist:  Dg Lumbar Spine Complete  Result Date: 02/06/2019 CLINICAL DATA:  Back pain after stretching a few days ago EXAM: LUMBAR SPINE - COMPLETE 4+ VIEW COMPARISON:  None. FINDINGS: There is no evidence of lumbar spine fracture. Alignment is normal. Intervertebral disc spaces are maintained. IMPRESSION: Negative. Electronically Signed   By: Marnee SpringJonathon  Watts M.D.   On: 02/06/2019 06:01      ____________________________________________   PROCEDURES  Procedure(s) performed: None Procedures Critical Care performed:  None ____________________________________________   INITIAL IMPRESSION / ASSESSMENT AND PLAN / ED COURSE   23 y.o. female with a history of chronic back pain, kidney stones, recurrent UTIs, depression, ADHD who presents for evaluation of back pain intermittent for the last few days.  On exam she is well-appearing in no distress with normal vitals, complete neurologically intact with no CT and L-spine tenderness.  No signs or  symptoms of cauda equina.  Differential diagnosis includes muscle spasm versus strain versus compression fracture versus disc disease versus pyelonephritis versus kidney stone.  Urinalysis is negative for UTI or blood.  Pregnancy test negative.  X-ray lumbar spines pending.  Will treat with IM Toradol and Flexeril.    _________________________ 6:07 AM on 02/06/2019 -----------------------------------------  XR negative.  Will DC home on Flexeril, heat, ibuprofen and follow-up with primary care doctor.  Discussed my standard return precautions.    As part of my medical decision making, I reviewed the following data within the electronic MEDICAL RECORD NUMBER Nursing notes reviewed and incorporated, Old chart reviewed, Radiograph reviewed , Notes from prior ED visits and [place] Controlled Substance Database   Patient was evaluated in Emergency Department today for the symptoms described in the history of present illness. Patient was evaluated in the context of the global COVID-19 pandemic, which necessitated consideration that the patient might be at risk for infection with the SARS-CoV-2 virus that causes COVID-19. Institutional protocols and algorithms that pertain to the evaluation of patients at risk for COVID-19 are in a state of rapid change based on information released by regulatory bodies including the CDC and federal and state organizations. These policies and algorithms were followed during the patient's care in the ED.   ____________________________________________   FINAL CLINICAL IMPRESSION(S) / ED DIAGNOSES   Final diagnoses:  Muscle spasm of back      NEW MEDICATIONS STARTED DURING THIS VISIT:  ED Discharge Orders         Ordered    ibuprofen (ADVIL) 600 MG tablet  Every 6 hours PRN     02/06/19 0605    cyclobenzaprine (FLEXERIL) 10 MG tablet  3 times daily PRN     02/06/19 [id number]           Note:  This document was prepared using Dragon voice recognition software and  may include unintentional dictation errors.    Nita SickleVeronese, [place], MD 02/06/19 [phone]

## 2019-02-07 LAB — URINE CULTURE: Culture: <10000 — AB

## 2019-03-23 ENCOUNTER — Other Ambulatory Visit: Payer: Self-pay

## 2019-03-23 DIAGNOSIS — Z20822 Contact with and (suspected) exposure to covid-19: Secondary | ICD-10-CM

## 2019-03-27 LAB — NOVEL CORONAVIRUS, NAA: SARS-CoV-2, NAA: NOT DETECTED

## 2019-04-23 ENCOUNTER — Other Ambulatory Visit: Payer: Self-pay

## 2019-04-23 ENCOUNTER — Encounter: Payer: Self-pay | Admitting: Emergency Medicine

## 2019-04-23 ENCOUNTER — Emergency Department
Admission: EM | Admit: 2019-04-23 | Discharge: 2019-04-23 | Disposition: A | Discharge status: Home / Self Care | Payer: Medicaid Other | Attending: Emergency Medicine | Admitting: Emergency Medicine

## 2019-04-23 DIAGNOSIS — H6093 Unspecified otitis externa, bilateral: Secondary | ICD-10-CM | POA: Insufficient documentation

## 2019-04-23 DIAGNOSIS — J45909 Unspecified asthma, uncomplicated: Secondary | ICD-10-CM | POA: Insufficient documentation

## 2019-04-23 DIAGNOSIS — F17210 Nicotine dependence, cigarettes, uncomplicated: Secondary | ICD-10-CM | POA: Insufficient documentation

## 2019-04-23 MED ORDER — CIPROFLOXACIN-DEXAMETHASONE 0.3-0.1 % OT SUSP: 4.0000 [drp] | Freq: Two times a day (BID) | OTIC | 0 refills | Status: AC

## 2019-04-23 NOTE — ED Triage Notes (Signed)
Presents with right ear pain for the past 2 days   Positive drainage  denies any fever or trauma

## 2019-04-23 NOTE — ED Provider Notes (Signed)
Emergency Department Provider Note  ____________________________________________  Time seen: Approximately 5:12 PM  I have reviewed the triage vital signs and the nursing notes.   HISTORY  Chief Complaint Ear Pain   Historian Patient     HPI Suzanne Nelson is a 24 y.o. female presents to the emergency department with bilateral ear pain for the past 2 days.  Patient denies fever or chills at home.  No drainage from the bilateral ears.  Patient endorses frequent ear infections as a child but states that she does not have ear infections often now as an adult.  No associated rhinorrhea, nasal congestion or nonproductive cough.  No other alleviating measures have been attempted.    Past Medical History:  Diagnosis Date  . ADHD (attention deficit hyperactivity disorder)   . Anxiety   . Asthma   . Asthma   . Depression   . No pertinent past medical history   . Panic attack      Immunizations up to date:  Yes.     Past Medical History:  Diagnosis Date  . ADHD (attention deficit hyperactivity disorder)   . Anxiety   . Asthma   . Asthma   . Depression   . No pertinent past medical history   . Panic attack     Patient Active Problem List   Diagnosis Date Noted  . Hydronephrosis 05/18/2015  . Calculus of distal left ureter 05/18/2015  . Left ureteral stone 05/18/2015  . Major depressive disorder, single episode, moderate (HCC) 03/16/2011  . 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 Oppositional defiant disorder 03/16/2011    Past Surgical History:  Procedure Laterality Date  . CYSTO N/A 05/19/2015   Procedure: CYSTO;  Surgeon: Vanna Scotland, MD;  Location: [facility] ORS;  Service: Urology;  Laterality: N/A;  . NO PAST SURGERIES    . URETEROSCOPY WITH HOLMIUM LASER LITHOTRIPSY Left 05/19/2015   Procedure: URETEROSCOPY WITH HOLMIUM LASER LITHOTRIPSY;  Surgeon: Vanna Scotland, MD;  Location: [facility] ORS;  Service: Urology;  Laterality: Left;     Prior to Admission medications   Medication Sig Start Date End Date Taking? Authorizing Provider  albuterol (PROVENTIL HFA;VENTOLIN HFA) 108 (90 Base) MCG/ACT inhaler Inhale 2 puffs into the lungs every 6 (six) hours as needed for wheezing or shortness of breath. 05/22/18   Enid Derry, PA-C  ciprofloxacin-dexamethasone (CIPRODEX) OTIC suspension Place 4 drops into both ears 2 (two) times daily for 7 days. 04/23/19 04/30/19  Orvil Feil, PA-C  cyclobenzaprine (FLEXERIL) 10 MG tablet Take 1 tablet (10 mg total) by mouth 3 (three) times daily as needed for muscle spasms. 02/06/19   Nita Sickle, MD  oxybutynin (DITROPAN) 5 MG tablet Take 1 tablet (5 mg total) by mouth every 8 (eight) hours as needed for bladder spasms. 05/19/15   Vanna Scotland, MD    Allergies Adhesive [tape]  Family History  Problem Relation Age of Onset  . Diabetes Mellitus II Mother   . Bipolar disorder Mother     Social History Social History   Tobacco Use  . Smoking status: Current Every Day Smoker  . Smokeless tobacco: Never Used  Substance Use Topics  . Alcohol use: Yes  . Drug use: No    Frequency: 1.0 times per week    Types: Marijuana    Comment: Last use one month age     Review of Systems  Constitutional: No fever/chills Eyes:  No discharge ENT: Patient has bilateral ear pain.  Respiratory: no cough. No  SOB/ use of accessory muscles to breath Gastrointestinal:   No nausea, no vomiting.  No diarrhea.  No constipation. Musculoskeletal: Negative for musculoskeletal pain. Skin: Negative for rash, abrasions, lacerations, ecchymosis.    ____________________________________________   PHYSICAL EXAM:  VITAL SIGNS: ED Triage Vitals  Enc Vitals Group     BP 04/23/19 1708 (!) 98/55     Pulse Rate 04/23/19 1708 91     Resp 04/23/19 1708 19     Temp 04/23/19 1708 97.6 F (36.4 C)     Temp Source 04/23/19 1708 Oral     SpO2 04/23/19 1708 99 %     Weight 04/23/19 1653 132 lb (59.9  kg)     Height 04/23/19 1650 5\' 2"  (1.575 m)     Head Circumference --      Peak Flow --      Pain Score 04/23/19 1650 4     Pain Loc --      Pain Edu? --      Excl. in [place]? --      Constitutional: Alert and oriented. Well appearing and in no acute distress. Eyes: Conjunctivae are normal. PERRL. EOMI. Head: Atraumatic. ENT:      Ears: TMs are pearly.  Patient has reproducible tenderness to palpation of the tragus bilaterally.  TMs are pearly bilaterally.      Nose: No congestion/rhinnorhea.      Mouth/Throat: Mucous membranes are moist.  Neck: No stridor.  No cervical spine tenderness to palpation. Cardiovascular: Normal rate, regular rhythm. Normal S1 and S2.  Good peripheral circulation. Respiratory: Normal respiratory effort without tachypnea or retractions. Lungs CTAB. Good air entry to the bases with no decreased or absent breath sounds Gastrointestinal: Bowel sounds x 4 quadrants. Soft and nontender to palpation. No guarding or rigidity. No distention. Musculoskeletal: Full range of motion to all extremities. No obvious deformities noted Neurologic:  Normal for age. No gross focal neurologic deficits are appreciated.  Skin:  Skin is warm, dry and intact. No rash noted. Psychiatric: Mood and affect are normal for age. Speech and behavior are normal.   ____________________________________________   LABS (all labs ordered are listed, but only abnormal results are displayed)  Labs Reviewed - No data to display ____________________________________________  EKG   ____________________________________________  RADIOLOGY  No results found.  ____________________________________________    PROCEDURES  Procedure(s) performed:     Procedures     Medications - No data to display   ____________________________________________   INITIAL IMPRESSION / ASSESSMENT AND PLAN / ED COURSE  Pertinent labs & imaging results that were available during my care of the  patient were reviewed by me and considered in my medical decision making (see chart for details).      Assessment and Plan: Otitis externa 24 year old female presents to the emergency department with bilateral ear pain for the past 2 days.  No fever or chills at home.  Physical exam findings suggest otitis externa.  Patient was discharged with Ciprodex.  All patient questions were answered.    ____________________________________________  FINAL CLINICAL IMPRESSION(S) / ED DIAGNOSES  Final diagnoses:  Otitis externa of both ears, unspecified chronicity, unspecified type      NEW MEDICATIONS STARTED DURING THIS VISIT:  ED Discharge Orders         Ordered    ciprofloxacin-dexamethasone (CIPRODEX) OTIC suspension  2 times daily     04/23/19 1706              This chart was dictated using voice  recognition software/Dragon. Despite best efforts to proofread, errors can occur which can change the meaning. Any change was purely unintentional.     Lannie Fields, PA-C 04/23/19 1715    Blake Divine, MD 04/24/19 [phone]

## 2019-04-28 ENCOUNTER — Other Ambulatory Visit: Payer: Self-pay

## 2019-04-28 ENCOUNTER — Emergency Department
Admission: EM | Admit: 2019-04-28 | Discharge: 2019-04-28 | Disposition: A | Discharge status: Home / Self Care | Payer: PRIVATE HEALTH INSURANCE | Attending: Emergency Medicine | Admitting: Emergency Medicine

## 2019-04-28 DIAGNOSIS — F17210 Nicotine dependence, cigarettes, uncomplicated: Secondary | ICD-10-CM | POA: Insufficient documentation

## 2019-04-28 DIAGNOSIS — G43909 Migraine, unspecified, not intractable, without status migrainosus: Secondary | ICD-10-CM

## 2019-04-28 DIAGNOSIS — J45909 Unspecified asthma, uncomplicated: Secondary | ICD-10-CM | POA: Insufficient documentation

## 2019-04-28 HISTORY — DX: Migraine, unspecified, not intractable, without status migrainosus: G43.909

## 2019-04-28 LAB — URINALYSIS, COMPLETE (UACMP) WITH MICROSCOPIC
Bacteria, UA: NONE SEEN
Bilirubin Urine: NEGATIVE
Glucose, UA: NEGATIVE mg/dL
Hgb urine dipstick: NEGATIVE
Ketones, ur: NEGATIVE mg/dL
Nitrite: NEGATIVE
Protein, ur: NEGATIVE mg/dL
Specific Gravity, Urine: 1.025 (ref 1.005–1.030)
pH: 6 (ref 5.0–8.0)

## 2019-04-28 LAB — POCT PREGNANCY, URINE: Preg Test, Ur: NEGATIVE

## 2019-04-28 MED ORDER — METOCLOPRAMIDE HCL 10 MG PO TABS
10.0000 mg | ORAL_TABLET | Freq: Once | ORAL | Status: AC
  Administered 2019-04-28: 22:00:00 10 mg via ORAL
  Filled 2019-04-28: qty 1

## 2019-04-28 MED ORDER — DIPHENHYDRAMINE HCL 25 MG PO CAPS
25.0000 mg | ORAL_CAPSULE | Freq: Once | ORAL | Status: AC
  Administered 2019-04-28: 22:00:00 25 mg via ORAL
  Filled 2019-04-28: qty 1

## 2019-04-28 MED ORDER — KETOROLAC TROMETHAMINE 10 MG PO TABS: 10.0000 mg | ORAL_TABLET | Freq: Three times a day (TID) | ORAL | 0 refills | Status: DC

## 2019-04-28 MED ORDER — METOCLOPRAMIDE HCL 5 MG PO TABS: 5.0000 mg | ORAL_TABLET | Freq: Three times a day (TID) | ORAL | 0 refills | Status: DC | PRN

## 2019-04-28 MED ORDER — KETOROLAC TROMETHAMINE 30 MG/ML IJ SOLN
30.0000 mg | Freq: Once | INTRAMUSCULAR | Status: AC
  Administered 2019-04-28: 22:00:00 30 mg via INTRAMUSCULAR
  Filled 2019-04-28: qty 1

## 2019-04-28 NOTE — Discharge Instructions (Signed)
You have been treated for an acute headache. You may take the prescription meds along with OTC Benadryl for headache pain relief. Consider seeking medical care a [facility] or [facility]. The prescriptions may be filled at [facility] Medication Assistance Program, free of charge, if you qualify. Return to the ED as needed.

## 2019-04-28 NOTE — ED Notes (Signed)
Pt reports headache now at 3/10, Jenise informed

## 2019-04-28 NOTE — ED Notes (Signed)
Pt ambulatory from triage with steady gait, reports migraine 8/10 x 3 days.

## 2019-04-28 NOTE — ED Provider Notes (Signed)
[facility] Emergency Department Provider Note ____________________________________________  Time seen: 2122  I have reviewed the triage vital signs and the nursing notes.  HISTORY  Chief Complaint  Migraine  HPI Suzanne Reliford is a 24 y.o. female presents as up to the ED for evaluation of a 3-day complaint of persistent headache.   Patient reports a history of migraines, but denies any current treatment as she is uninsured currently and has no PCP.  She denies any preceding injury, accident, or trauma.  She is been taking Tylenol intermittently for her pain.  She presented today from work when she began to experience some blurry vision.  She denies any nausea, vomiting, dizziness, vertigo, or syncope.  She also denies any fever, chills, or sweats.  She has had no distal paresthesias, chest pain, or shortness of breath.  Past Medical History:  Diagnosis Date  . Asthma   . Migraine     There are no problems to display for this patient.   Past Surgical History:  Procedure Laterality Date  . STENT PLACE LEFT URETER ([place] HX)      Prior to Admission medications   Medication Sig Start Date End Date Taking? Authorizing Provider  albuterol (VENTOLIN HFA) 108 (90 Base) MCG/ACT inhaler Inhale 2 puffs into the lungs every 6 (six) hours as needed for wheezing or shortness of breath. 10/31/18   Nena Polio, MD  ketorolac (TORADOL) 10 MG tablet Take 1 tablet (10 mg total) by mouth every 8 (eight) hours. 04/28/19   G[place]more Karl, Dannielle Karvonen, PA-C  metoCLOPramide (REGLAN) 5 MG tablet Take 1 tablet (5 mg total) by mouth every 8 (eight) hours as needed for up to 5 days for nausea or vomiting. 04/28/19 05/03/19  Zakhai Meisinger, Dannielle Karvonen, PA-C    Allergies Onion and Tape  History reviewed. No pertinent family history.  Social History Social History   Tobacco Use  . Smoking status: Current Every Day Smoker    Packs/day: 1.00    Types: Cigarettes  . Smokeless tobacco: Never  Used  Substance Use Topics  . Alcohol use: Yes    Comment: socially  . Drug use: Not Currently    Review of Systems  Constitutional: Negative for fever. Eyes: reports now resolved visual changes described as blurry ENT: Negative for sore throat. Cardiovascular: Negative for chest pain. Respiratory: Negative for shortness of breath. Gastrointestinal: Negative for abdominal pain, vomiting and diarrhea. Genitourinary: Negative for dysuria. Musculoskeletal: Negative for back pain. Skin: Negative for rash. Neurological: Positive for headaches. Denies focal weakness or numbness. ____________________________________________  PHYSICAL EXAM:  VITAL SIGNS: ED Triage Vitals [04/28/19 2018]  Enc Vitals Group     BP 119/77     Pulse Rate 89     Resp 18     Temp 98.5 F (36.9 C)     Temp Source Oral     SpO2 100 %     Weight 130 lb (59 kg)     Height 5\' 2"  (1.575 m)     Head Circumference      Peak Flow      Pain Score 7     Pain Loc      Pain Edu?      Excl. in [place]?     Constitutional: Alert and oriented. Well appearing and in no distress. Head: Normocephalic and atraumatic. Eyes: Conjunctivae are normal. PERRL. Normal extraocular movements Neck: Supple. No thyromegaly. Cardiovascular: Normal rate, regular rhythm. Normal distal pulses. Respiratory: Normal respiratory effort. No wheezes/rales/rhonchi.  Gastrointestinal: Soft and nontender. No distention. Musculoskeletal: Nontender with normal range of motion in all extremities.  Neurologic:  Normal gait without ataxia. Normal speech and language. No gross focal neurologic deficits are appreciated. Skin:  Skin is warm, dry and intact. No rash noted. Psychiatric: Mood and affect are normal. Patient exhibits appropriate insight and judgment. ____________________________________________   LABS (pertinent positives/negatives) Labs Reviewed  URINALYSIS, COMPLETE (UACMP) WITH MICROSCOPIC - Abnormal; Notable for the following  components:      Result Value   Color, Urine YELLOW (*)    APPearance HAZY (*)    Leukocytes,Ua SMALL (*)    All other components within normal limits  POC URINE PREG, ED  POCT PREGNANCY, URINE  ____________________________________________  PROCEDURES  Toradol 30 mg IVP Flexeril 10 mg Benadryl 25 mg PO Reglan 10 mg PO  Procedures ____________________________________________  INITIAL IMPRESSION / ASSESSMENT AND PLAN / ED COURSE  Patient reports improvement of her headache pain at the time of this disposition.  She reports pain at a 3 out of 10 currently.  She is requesting discharge from the ED and a work note at this time. She will be discharged with prescriptions for Reglan and Toradol, to take with OTC Benadryl for headache abortion. She is also referred to the community clinics for routine care and [facility] for Rx assistance. A work note is provided.   Ramiah Helfrich was evaluated in Emergency Department on 04/29/2019 for the symptoms described in the history of present illness. She was evaluated in the context of the global COVID-19 pandemic, which necessitated consideration that the patient might be at risk for infection with the SARS-CoV-2 virus that causes COVID-19. Institutional protocols and algorithms that pertain to the evaluation of patients at risk for COVID-19 are in a state of rapid change based on information released by regulatory bodies including the CDC and federal and state organizations. These policies and algorithms were followed during the patient's care in the ED. ____________________________________________  FINAL CLINICAL IMPRESSION(S) / ED DIAGNOSES  Final diagnoses:  Migraine without status migrainosus, not intractable, unspecified migraine type      Karmen Stabs, Charlesetta Ivory, PA-C 04/29/19 0004    Chesley Noon, MD 04/29/19 2209

## 2019-04-28 NOTE — ED Triage Notes (Signed)
Pt to the er for migraine x 3 days. Pt takes excedrin at home. No RX for migraine meds due to no ins. Pt reports sensitivity to lights and blurry vision.

## 2019-04-28 NOTE — ED Notes (Signed)
Pt medicated for migraine. States hasnt had one in years but this one has lasted 3 days.

## 2019-06-15 ENCOUNTER — Emergency Department
Admission: EM | Admit: 2019-06-15 | Discharge: 2019-06-15 | Disposition: A | Discharge status: Home / Self Care | Payer: Self-pay | Attending: Emergency Medicine | Admitting: Emergency Medicine

## 2019-06-15 ENCOUNTER — Encounter: Payer: Self-pay | Admitting: Emergency Medicine

## 2019-06-15 ENCOUNTER — Emergency Department: Payer: Self-pay

## 2019-06-15 ENCOUNTER — Other Ambulatory Visit: Payer: Self-pay

## 2019-06-15 DIAGNOSIS — J45909 Unspecified asthma, uncomplicated: Secondary | ICD-10-CM | POA: Insufficient documentation

## 2019-06-15 DIAGNOSIS — F17210 Nicotine dependence, cigarettes, uncomplicated: Secondary | ICD-10-CM | POA: Insufficient documentation

## 2019-06-15 DIAGNOSIS — Z3202 Encounter for pregnancy test, result negative: Secondary | ICD-10-CM | POA: Insufficient documentation

## 2019-06-15 DIAGNOSIS — N3001 Acute cystitis with hematuria: Secondary | ICD-10-CM | POA: Insufficient documentation

## 2019-06-15 LAB — CBC WITH DIFFERENTIAL/PLATELET
Abs Immature Granulocytes: 0.03 10*3/uL (ref 0.00–0.07)
Basophils Absolute: 0 10*3/uL (ref 0.0–0.1)
Basophils Relative: 0 %
Eosinophils Absolute: 0.1 10*3/uL (ref 0.0–0.5)
Eosinophils Relative: 1 %
HCT: 44 % (ref 36.0–46.0)
Hemoglobin: 14.8 g/dL (ref 12.0–15.0)
Immature Granulocytes: 0 %
Lymphocytes Relative: 21 %
Lymphs Abs: 2.5 10*3/uL (ref 0.7–4.0)
MCH: 30.6 pg (ref 26.0–34.0)
MCHC: 33.6 g/dL (ref 30.0–36.0)
MCV: 90.9 fL (ref 80.0–100.0)
Monocytes Absolute: 0.6 10*3/uL (ref 0.1–1.0)
Monocytes Relative: 5 %
Neutro Abs: 8.4 10*3/uL — ABNORMAL HIGH (ref 1.7–7.7)
Neutrophils Relative %: 73 %
Platelets: 322 10*3/uL (ref 150–400)
RBC: 4.84 MIL/uL (ref 3.87–5.11)
RDW: 12.3 % (ref 11.5–15.5)
WBC: 11.7 10*3/uL — ABNORMAL HIGH (ref 4.0–10.5)
nRBC: 0 % (ref 0.0–0.2)

## 2019-06-15 LAB — BASIC METABOLIC PANEL
Anion gap: 4 — ABNORMAL LOW (ref 5–15)
BUN: 6 mg/dL (ref 6–20)
CO2: 28 mmol/L (ref 22–32)
Calcium: 8.8 mg/dL — ABNORMAL LOW (ref 8.9–10.3)
Chloride: 107 mmol/L (ref 98–111)
Creatinine, Ser: 0.58 mg/dL (ref 0.44–1.00)
GFR calc Af Amer: >60 mL/min (ref >60)
GFR calc non Af Amer: >60 mL/min (ref >60)
Glucose, Bld: 83 mg/dL (ref 70–99)
Potassium: 3.6 mmol/L (ref 3.5–5.1)
Sodium: 139 mmol/L (ref 135–145)

## 2019-06-15 LAB — URINALYSIS, COMPLETE (UACMP) WITH MICROSCOPIC
Glucose, UA: NEGATIVE mg/dL
Ketones, ur: 20 mg/dL — AB
Nitrite: POSITIVE — AB
Protein, ur: >=300 mg/dL — AB
RBC / HPF: >50 RBC/hpf — ABNORMAL HIGH (ref 0–5)
Specific Gravity, Urine: 1.027 (ref 1.005–1.030)
Squamous Epithelial / HPF: NONE SEEN (ref 0–5)
WBC, UA: >50 WBC/hpf — ABNORMAL HIGH (ref 0–5)
pH: 6 (ref 5.0–8.0)

## 2019-06-15 LAB — PREGNANCY, URINE: Preg Test, Ur: NEGATIVE

## 2019-06-15 MED ORDER — SODIUM CHLORIDE 0.9 % IV SOLN
1.0000 g | Freq: Once | INTRAVENOUS | Status: AC
  Administered 2019-06-15: 1 g via INTRAVENOUS
  Filled 2019-06-15: qty 10

## 2019-06-15 MED ORDER — PHENAZOPYRIDINE HCL 200 MG PO TABS: 200.0000 mg | ORAL_TABLET | Freq: Three times a day (TID) | ORAL | 0 refills | Status: DC | PRN

## 2019-06-15 MED ORDER — KETOROLAC TROMETHAMINE 30 MG/ML IJ SOLN
30.0000 mg | Freq: Once | INTRAMUSCULAR | Status: AC
  Administered 2019-06-15: 30 mg via INTRAVENOUS
  Filled 2019-06-15: qty 1

## 2019-06-15 MED ORDER — CEPHALEXIN 500 MG PO CAPS: 500.0000 mg | ORAL_CAPSULE | Freq: Three times a day (TID) | ORAL | 0 refills | Status: AC

## 2019-06-15 MED ORDER — SODIUM CHLORIDE 0.9 % IV BOLUS
1000.0000 mL | Freq: Once | INTRAVENOUS | Status: AC
  Administered 2019-06-15: 1000 mL via INTRAVENOUS

## 2019-06-15 NOTE — ED Triage Notes (Signed)
Presents with lower back pain  States pain is moving into right hip

## 2019-06-15 NOTE — ED Provider Notes (Signed)
[facility] Emergency Department Provider Note ____________________________________________  Time seen: 1418  I have reviewed the triage vital signs and the nursing notes.  HISTORY  Chief Complaint  Back Pain  HPI Suzanne Nelson is a 24 y.o. female presents her self to the ED for evaluation of low back pain.   Patient describes the pain is moving into her right hip patient denies any preceding injury, accident, or trauma.  She describes onset 2 days ago after which she calls "being in some unusual positions."  She has taken a single dose of Advil at 400 mg yesterday for her symptoms.  Past Medical History:  Diagnosis Date  . Asthma   . Migraine     There are no problems to display for this patient.   Past Surgical History:  Procedure Laterality Date  . STENT PLACE LEFT URETER ([place] HX)      Prior to Admission medications   Medication Sig Start Date End Date Taking? Authorizing Provider  albuterol (VENTOLIN HFA) 108 (90 Base) MCG/ACT inhaler Inhale 2 puffs into the lungs every 6 (six) hours as needed for wheezing or shortness of breath. 10/31/18   Nena Polio, MD  cephALEXin (KEFLEX) 500 MG capsule Take 1 capsule (500 mg total) by mouth 3 (three) times daily for 7 days. 06/15/19 06/22/19  Latha Staunton, Dannielle Karvonen, PA-C  phenazopyridine (PYRIDIUM) 200 MG tablet Take 1 tablet (200 mg total) by mouth 3 (three) times daily as needed for pain. 06/15/19   Ezekial Arns, Dannielle Karvonen, PA-C    Allergies Onion and Tape  No family history on file.  Social History Social History   Tobacco Use  . Smoking status: Current Every Day Smoker    Packs/day: 1.00    Types: Cigarettes  . Smokeless tobacco: Never Used  Substance Use Topics  . Alcohol use: Yes    Comment: socially  . Drug use: Not Currently    Review of Systems  Constitutional: Negative for fever. Cardiovascular: Negative for chest pain. Respiratory: Negative for shortness of  breath. Gastrointestinal: Negative for abdominal pain, vomiting and diarrhea. Genitourinary: Negative for dysuria. Musculoskeletal: Positive for back pain. Skin: Negative for rash. Neurological: Negative for headaches, focal weakness or numbness. ____________________________________________  PHYSICAL EXAM:  VITAL SIGNS: ED Triage Vitals  Enc Vitals Group     BP 06/15/19 1357 120/81     Pulse Rate 06/15/19 1357 82     Resp 06/15/19 1357 18     Temp 06/15/19 1357 (!) 97.5 F (36.4 C)     Temp Source 06/15/19 1357 Oral     SpO2 06/15/19 1357 99 %     Weight 06/15/19 1353 138 lb (62.6 kg)     Height 06/15/19 1353 5\' 2"  (1.575 m)     Head Circumference --      Peak Flow --      Pain Score 06/15/19 1353 9     Pain Loc --      Pain Edu? --      Excl. in [place]? --     Constitutional: Alert and oriented. Well appearing and in no distress. Head: Normocephalic and atraumatic. Eyes: Conjunctivae are normal. Normal extraocular movements Cardiovascular: Normal rate, regular rhythm. Normal distal pulses. Respiratory: Normal respiratory effort. No wheezes/rales/rhonchi. Gastrointestinal: Soft and nontender. No distention. Right flank pain elicited. Musculoskeletal: Normal spinal alignment with out midline tenderness, spasm, deformity, or step-off.  Patient is mildly tender to the right paraspinal musculature and lumbar sacral region.  Normal lumbar flexion  and extension range on exam.  Normal hip flexion noted on exam.  Nontender with normal range of motion in all extremities.  Neurologic: Cranial nerves II through XII grossly intact.  Normal LE DTRs bilaterally.  Negative supine and seated straight leg raise.  Normal gait without ataxia. Normal speech and language. No gross focal neurologic deficits are appreciated. Skin:  Skin is warm, dry and intact. No rash noted. Psychiatric: Mood and affect are normal. Patient exhibits appropriate insight and  judgment. ____________________________________________   LABS (pertinent positives/negatives) Labs Reviewed  URINALYSIS, COMPLETE (UACMP) WITH MICROSCOPIC - Abnormal; Notable for the following components:      Result Value   Color, Urine YELLOW (*)    APPearance TURBID (*)    Hgb urine dipstick LARGE (*)    Bilirubin Urine SMALL (*)    Ketones, ur 20 (*)    Protein, ur >=300 (*)    Nitrite POSITIVE (*)    Leukocytes,Ua MODERATE (*)    RBC / HPF >50 (*)    WBC, UA >50 (*)    Bacteria, UA MANY (*)    All other components within normal limits  CBC WITH DIFFERENTIAL/PLATELET - Abnormal; Notable for the following components:   WBC 11.7 (*)    Neutro Abs 8.4 (*)    All other components within normal limits  BASIC METABOLIC PANEL - Abnormal; Notable for the following components:   Calcium 8.8 (*)    Anion gap 4 (*)    All other components within normal limits  URINE CULTURE  PREGNANCY, URINE  ____________________________________________   RADIOLOGY  CT Renal Study  IMPRESSION: Negative non contrasted CT of the abdomen and pelvis. Negative for hydronephrosis or urinary tract calculus. ____________________________________________  PROCEDURES  Rocephin 1 g IVP Toradol 30 mg IVP NS 1000 ml IVP Procedures ____________________________________________  INITIAL IMPRESSION / ASSESSMENT AND PLAN / ED COURSE  Patient with ED evaluation of sudden onset of right-sided low back pain.  Patient denies any preceding injury, trauma, or accident.  Her clinical picture was overall reassuring her urinalysis did show signs of moderate cystitis with hematuria.  No CT evidence of renal stones or pyelonephritis.  Patient treated with an empiric dose of Rocephin in the ED and 500+ mL bolus of IV fluids.  She reports improvement of her symptoms after administration of IV pain medicines.  She is given follow-up instructions and will be treated with Keflex and Pyridium.  She is encouraged to  discontinue carbonated drinks and increase water intake.  She will follow-up with local community clinics and return to the ED as necessary.  A work note is provided as requested.  Suzanne Nelson was evaluated in Emergency Department on 06/15/2019 for the symptoms described in the history of present illness. She was evaluated in the context of the global COVID-19 pandemic, which necessitated consideration that the patient might be at risk for infection with the SARS-CoV-2 virus that causes COVID-19. Institutional protocols and algorithms that pertain to the evaluation of patients at risk for COVID-19 are in a state of rapid change based on information released by regulatory bodies including the CDC and federal and state organizations. These policies and algorithms were followed during the patient's care in the ED. ____________________________________________  FINAL CLINICAL IMPRESSION(S) / ED DIAGNOSES  Final diagnoses:  Acute cystitis with hematuria      Karmen Stabs Charlesetta Ivory, PA-C 06/15/19 1747    Minna Antis, MD 06/16/19 1415

## 2019-06-15 NOTE — Discharge Instructions (Addendum)
You are being treated for a UTI. Your labs indicate a bladder infection. Your labs do not show any signs of a kidney infection or kidney stones. Take the prescription meds as directed. Drink non-carbonated drinks and empty your bladder regularly. Follow-up with [facility] for continued symptoms. Return to the ED as needed.

## 2019-06-18 LAB — URINE CULTURE
Culture: >=100000 — AB
Special Requests: NORMAL

## 2019-06-19 ENCOUNTER — Encounter: Payer: Self-pay | Admitting: Emergency Medicine

## 2019-06-22 ENCOUNTER — Other Ambulatory Visit: Payer: Self-pay

## 2019-06-22 ENCOUNTER — Emergency Department
Admission: EM | Admit: 2019-06-22 | Discharge: 2019-06-22 | Disposition: A | Discharge status: Home / Self Care | Payer: Medicaid Other | Attending: Emergency Medicine | Admitting: Emergency Medicine

## 2019-06-22 ENCOUNTER — Encounter: Payer: Self-pay | Admitting: Emergency Medicine

## 2019-06-22 DIAGNOSIS — J45909 Unspecified asthma, uncomplicated: Secondary | ICD-10-CM | POA: Insufficient documentation

## 2019-06-22 DIAGNOSIS — B379 Candidiasis, unspecified: Secondary | ICD-10-CM

## 2019-06-22 DIAGNOSIS — Z79899 Other long term (current) drug therapy: Secondary | ICD-10-CM | POA: Insufficient documentation

## 2019-06-22 DIAGNOSIS — F17210 Nicotine dependence, cigarettes, uncomplicated: Secondary | ICD-10-CM | POA: Insufficient documentation

## 2019-06-22 LAB — URINALYSIS, COMPLETE (UACMP) WITH MICROSCOPIC
Bilirubin Urine: NEGATIVE
Glucose, UA: NEGATIVE mg/dL
Hgb urine dipstick: NEGATIVE
Ketones, ur: NEGATIVE mg/dL
Nitrite: NEGATIVE
Protein, ur: NEGATIVE mg/dL
Specific Gravity, Urine: 1.015 (ref 1.005–1.030)
pH: 8 (ref 5.0–8.0)

## 2019-06-22 LAB — CBC WITH DIFFERENTIAL/PLATELET
Abs Immature Granulocytes: 0.02 10*3/uL (ref 0.00–0.07)
Basophils Absolute: 0.1 10*3/uL (ref 0.0–0.1)
Basophils Relative: 1 %
Eosinophils Absolute: 0.3 10*3/uL (ref 0.0–0.5)
Eosinophils Relative: 3 %
HCT: 44.8 % (ref 36.0–46.0)
Hemoglobin: 14.9 g/dL (ref 12.0–15.0)
Immature Granulocytes: 0 %
Lymphocytes Relative: 26 %
Lymphs Abs: 2.9 10*3/uL (ref 0.7–4.0)
MCH: 31 pg (ref 26.0–34.0)
MCHC: 33.3 g/dL (ref 30.0–36.0)
MCV: 93.3 fL (ref 80.0–100.0)
Monocytes Absolute: 0.7 10*3/uL (ref 0.1–1.0)
Monocytes Relative: 6 %
Neutro Abs: 7 10*3/uL (ref 1.7–7.7)
Neutrophils Relative %: 64 %
Platelets: 330 10*3/uL (ref 150–400)
RBC: 4.8 MIL/uL (ref 3.87–5.11)
RDW: 12.5 % (ref 11.5–15.5)
WBC: 10.9 10*3/uL — ABNORMAL HIGH (ref 4.0–10.5)
nRBC: 0 % (ref 0.0–0.2)

## 2019-06-22 LAB — COMPREHENSIVE METABOLIC PANEL
ALT: 18 U/L (ref 0–44)
AST: 19 U/L (ref 15–41)
Albumin: 4.3 g/dL (ref 3.5–5.0)
Alkaline Phosphatase: 67 U/L (ref 38–126)
Anion gap: 10 (ref 5–15)
BUN: 11 mg/dL (ref 6–20)
CO2: 18 mmol/L — ABNORMAL LOW (ref 22–32)
Calcium: 9.2 mg/dL (ref 8.9–10.3)
Chloride: 108 mmol/L (ref 98–111)
Creatinine, Ser: 0.45 mg/dL (ref 0.44–1.00)
GFR calc Af Amer: >60 mL/min (ref >60)
GFR calc non Af Amer: >60 mL/min (ref >60)
Glucose, Bld: 88 mg/dL (ref 70–99)
Potassium: 4.3 mmol/L (ref 3.5–5.1)
Sodium: 136 mmol/L (ref 135–145)
Total Bilirubin: 1.1 mg/dL (ref 0.3–1.2)
Total Protein: 6.9 g/dL (ref 6.5–8.1)

## 2019-06-22 LAB — WET PREP, GENITAL
Clue Cells Wet Prep HPF POC: NONE SEEN
Sperm: NONE SEEN
Trich, Wet Prep: NONE SEEN

## 2019-06-22 LAB — POCT PREGNANCY, URINE: Preg Test, Ur: NEGATIVE

## 2019-06-22 LAB — CHLAMYDIA/NGC RT PCR (ARMC ONLY)
Chlamydia Tr: NOT DETECTED
N gonorrhoeae: NOT DETECTED

## 2019-06-22 MED ORDER — FLUCONAZOLE 50 MG PO TABS
150.0000 mg | ORAL_TABLET | Freq: Once | ORAL | Status: AC
  Administered 2019-06-22: 150 mg via ORAL
  Filled 2019-06-22: qty 1

## 2019-06-22 MED ORDER — FLUCONAZOLE 100 MG PO TABS: 150.0000 mg | ORAL_TABLET | Freq: Every day | ORAL | 0 refills | Status: AC

## 2019-06-22 NOTE — ED Notes (Signed)
Pt states her pain since dx w/ UTI has not subsided. Pt states her pain is suprapubic and radiates to the right side and across to the lower back.

## 2019-06-22 NOTE — ED Triage Notes (Signed)
Patient presents to the ED with lower back and lower abdominal pain and burning with urination.  Patient states she was diagnosed with a UTI 1 week ago and will finish course of antibiotics today.  Patient states she is feeling the same as she did 1 week ago and has not seen any improvement.

## 2019-06-22 NOTE — ED Provider Notes (Signed)
[facility] Emergency Department Provider Note  ____________________________________________  Time seen: Approximately 3:25 PM  I have reviewed the triage vital signs and the nursing notes.   HISTORY  Chief Complaint Dysuria    HPI Suzanne Nelson is a 24 y.o. female that presents to the emergency department for evaluation of low abdominal discomfort, low back discomfort, dysuria, urinary urgency and frequency, white vaginal discharge for 1.5 weeks.  Patient describes vaginal discharge as thick, white, flaky.  Patient states that she was evaluated in this emergency department 1 week ago and was diagnosed with a urinary tract infection.  Patient states that dysuria, urgency, frequency has not improved since beginning medications.  She feels like "also I am doing is peeing." She is drinking a lot of water though.  She has been taking Keflex for symptoms without improvement.  Vaginal discharge started before beginning antibiotics.  Last menstrual period was at the end of January and her menstrual cycle is irregular.  No recent illness.  Patient has a new sexual partner 2 weeks ago but they were both tested for STDs prior.  She has a history of kidney stones.  No hematuria.  No fevers, vomiting, diarrhea, constipation.   Past Medical History:  Diagnosis Date  . ADHD (attention deficit hyperactivity disorder)   . Anxiety   . Asthma   . Asthma   . Depression   . Migraine   . No pertinent past medical history   . Panic attack     Patient Active Problem List   Diagnosis Date Noted  . Hydronephrosis 05/18/2015  . Calculus of distal left ureter 05/18/2015  . Left ureteral stone 05/18/2015  . Major depressive disorder, single episode, moderate ([place]) 03/16/2011  . 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 Oppositional defiant disorder 03/16/2011    Past Surgical History:  Procedure Laterality Date  . CYSTO N/A 05/19/2015   Procedure:  CYSTO;  Surgeon: Hollice Espy, MD;  Location: [facility] ORS;  Service: Urology;  Laterality: N/A;  . NO PAST SURGERIES    . STENT PLACE LEFT URETER ([place] HX)    . URETEROSCOPY WITH HOLMIUM LASER LITHOTRIPSY Left 05/19/2015   Procedure: URETEROSCOPY WITH HOLMIUM LASER LITHOTRIPSY;  Surgeon: Hollice Espy, MD;  Location: [facility] ORS;  Service: Urology;  Laterality: Left;    Prior to Admission medications   Medication Sig Start Date End Date Taking? Authorizing Provider  albuterol (PROVENTIL HFA;VENTOLIN HFA) 108 (90 Base) MCG/ACT inhaler Inhale 2 puffs into the lungs every 6 (six) hours as needed for wheezing or shortness of breath. 05/22/18   Laban Emperor, PA-C  albuterol (VENTOLIN HFA) 108 (90 Base) MCG/ACT inhaler Inhale 2 puffs into the lungs every 6 (six) hours as needed for wheezing or shortness of breath. 10/31/18   Nena Polio, MD  cephALEXin (KEFLEX) 500 MG capsule Take 1 capsule (500 mg total) by mouth 3 (three) times daily for 7 days. 06/15/19 06/22/19  Menshew, Dannielle Karvonen, PA-C  cyclobenzaprine (FLEXERIL) 10 MG tablet Take 1 tablet (10 mg total) by mouth 3 (three) times daily as needed for muscle spasms. 02/06/19   Rudene Re, MD  fluconazole (DIFLUCAN) 100 MG tablet Take 1.5 tablets (150 mg total) by mouth daily for 1 day. 06/25/19 06/26/19  Laban Emperor, PA-C  oxybutynin (DITROPAN) 5 MG tablet Take 1 tablet (5 mg total) by mouth every 8 (eight) hours as needed for bladder spasms. 05/19/15   Hollice Espy, MD  phenazopyridine (PYRIDIUM) 200 MG tablet Take 1  tablet (200 mg total) by mouth 3 (three) times daily as needed for pain. 06/15/19   Menshew, Charlesetta Ivory, PA-C    Allergies Onion, Adhesive [tape], and Tape  Family History  Problem Relation Age of Onset  . Diabetes Mellitus II Mother   . Bipolar disorder Mother     Social History Social History   Tobacco Use  . Smoking status: Current Every Day Smoker    Packs/day: 1.00    Types: Cigarettes  . Smokeless  tobacco: Never Used  Substance Use Topics  . Alcohol use: Yes    Comment: socially  . Drug use: Not Currently    Frequency: 1.0 times per week    Types: Marijuana    Comment: Last use one month age     Review of Systems  Constitutional: No fever/chills ENT: No upper respiratory complaints. Cardiovascular: No chest pain. Respiratory: No cough. No SOB. Gastrointestinal: Mild lower abdominal discomfort.  No nausea, no vomiting.  Genitourinary: Positive for dysuria and urgency. Musculoskeletal: Negative for musculoskeletal pain. Skin: Negative for rash, abrasions, lacerations, ecchymosis. Neurological: Negative for headaches, numbness or tingling   ____________________________________________   PHYSICAL EXAM:  VITAL SIGNS: ED Triage Vitals  Enc Vitals Group     BP 06/22/19 1405 112/73     Pulse Rate 06/22/19 1405 91     Resp 06/22/19 1405 16     Temp 06/22/19 1405 98.1 F (36.7 C)     Temp Source 06/22/19 1405 Oral     SpO2 06/22/19 1405 97 %     Weight 06/22/19 1406 138 lb (62.6 kg)     Height 06/22/19 1406 5\' 2"  (1.575 m)     Head Circumference --      Peak Flow --      Pain Score 06/22/19 1405 6     Pain Loc --      Pain Edu? --      Excl. in [place]? --      Constitutional: Alert and oriented. Well appearing and in no acute distress. Eyes: Conjunctivae are normal. PERRL. EOMI. Head: Atraumatic. ENT:      Ears:      Nose: No congestion/rhinnorhea.      Mouth/Throat: Mucous membranes are moist.  Neck: No stridor.   Cardiovascular: Normal rate, regular rhythm.  Good peripheral circulation. Respiratory: Normal respiratory effort without tachypnea or retractions. Lungs CTAB. Good air entry to the bases with no decreased or absent breath sounds. Gastrointestinal: Bowel sounds 4 quadrants.  Mild right and left lower quadrant tenderness to palpation. No guarding or rigidity. No palpable masses. No distention. No CVA tenderness. Musculoskeletal: Full range of motion to  all extremities. No gross deformities appreciated. Genitourinary: No external rashes or lesions.  White vaginal discharge.  No cervical motion tenderness. Neurologic:  Normal speech and language. No gross focal neurologic deficits are appreciated.  Skin:  Skin is warm, dry and intact. No rash noted. Psychiatric: Mood and affect are normal. Speech and behavior are normal. Patient exhibits appropriate insight and judgement.   ____________________________________________   LABS (all labs ordered are listed, but only abnormal results are displayed)  Labs Reviewed  WET PREP, GENITAL - Abnormal; Notable for the following components:      Result Value   Yeast Wet Prep HPF POC PRESENT (*)    WBC, Wet Prep HPF POC FEW (*)    All other components within normal limits  URINALYSIS, COMPLETE (UACMP) WITH MICROSCOPIC - Abnormal; Notable for the following components:   Color,  Urine YELLOW (*)    APPearance CLOUDY (*)    Leukocytes,Ua TRACE (*)    Bacteria, UA RARE (*)    All other components within normal limits  CBC WITH DIFFERENTIAL/PLATELET - Abnormal; Notable for the following components:   WBC 10.9 (*)    All other components within normal limits  COMPREHENSIVE METABOLIC PANEL - Abnormal; Notable for the following components:   CO2 18 (*)    All other components within normal limits  CHLAMYDIA/NGC RT PCR ([facility] ONLY)  URINE CULTURE  POC URINE PREG, ED  POCT PREGNANCY, URINE   ____________________________________________  EKG   ____________________________________________  RADIOLOGY  No results found.  ____________________________________________    PROCEDURES  Procedure(s) performed:    Procedures    Medications  fluconazole (DIFLUCAN) tablet 150 mg (150 mg Oral Given 06/22/19 1736)     ____________________________________________   INITIAL IMPRESSION / ASSESSMENT AND PLAN / ED COURSE  Pertinent labs & imaging results that were available during my  care of the patient were reviewed by me and considered in my medical decision making (see chart for details).  Review of the [place] CSRS was performed in accordance of the [facility] prior to dispensing any controlled drugs.   Patient's diagnosis is consistent with yeast vaginitis.  Vital signs and exam are reassuring.  Urinalysis results with trace leukocytes, rare bacteria, squamous epithelial cells and yeast.  Urine will be sent for culture and patient will be treated for yeast.  Wet prep reveals yeast.  Gonorrhea and Chlamydia are negative.  Patient was given Diflucan in the emergency department for yeast infection.  Patient will be discharged home with prescriptions for 1 dose of Diflucan the patient will take in 72 hours. Patient is to follow up with primary care as directed. Patient is given ED precautions to return to the ED for any worsening or new symptoms.  Suzanne Nelson was evaluated in Emergency Department on 06/22/2019 for the symptoms described in the history of present illness. She was evaluated in the context of the global COVID-19 pandemic, which necessitated consideration that the patient might be at risk for infection with the SARS-CoV-2 virus that causes COVID-19. Institutional protocols and algorithms that pertain to the evaluation of patients at risk for COVID-19 are in a state of rapid change based on information released by regulatory bodies including the CDC and federal and state organizations. These policies and algorithms were followed during the patient's care in the ED.   ____________________________________________  FINAL CLINICAL IMPRESSION(S) / ED DIAGNOSES  Final diagnoses:  Yeast infection      NEW MEDICATIONS STARTED DURING THIS VISIT:  ED Discharge Orders         Ordered    fluconazole (DIFLUCAN) 100 MG tablet  Daily     06/22/19 1731              This chart was dictated using voice recognition software/Dragon. Despite best efforts to proofread, errors  can occur which can change the meaning. Any change was purely unintentional.    Enid Derry, PA-C 06/22/19 2108    Phineas Semen, MD 06/22/19 2330

## 2019-06-24 LAB — URINE CULTURE: Culture: 70000 — AB

## 2019-06-25 NOTE — Progress Notes (Addendum)
Discussed patient with:  N/A Assessment:   Patient was recently seen in the ED for urine culture that grew yeast. Patient was discharged on fluconazole.  Patient was called, at ~1330 on 06/25/19, about recent results but was unable to reach her. Pharmacy left a voicemail asking patient to contact pharmacy. Pharmacy will continue to reach out to the patient.    Update @1456 : Pharmacy called and LVM for patient to call back.   Update @ 1432: Called and spoke with patient who reports that she is feeling much better. She denies low abdominal discomfort, low back discomfort, dysuria, urinary urgency and frequency, white vaginal discharge, fever, and chills. At this time, no further interventions are needed and no need to discuss patient with ED physician.   Thank you for allowing pharmacy to be a part of this patient's care.  , PharmD Clinical Pharmacist

## 2019-09-28 ENCOUNTER — Emergency Department
Admission: EM | Admit: 2019-09-28 | Discharge: 2019-09-28 | Disposition: A | Discharge status: Home / Self Care | Payer: Medicaid Other | Attending: Student | Admitting: Student

## 2019-09-28 ENCOUNTER — Emergency Department: Payer: Medicaid Other

## 2019-09-28 ENCOUNTER — Other Ambulatory Visit: Payer: Self-pay

## 2019-09-28 ENCOUNTER — Encounter: Payer: Self-pay | Admitting: Emergency Medicine

## 2019-09-28 DIAGNOSIS — O209 Hemorrhage in early pregnancy, unspecified: Secondary | ICD-10-CM

## 2019-09-28 DIAGNOSIS — O4690 Antepartum hemorrhage, unspecified, unspecified trimester: Secondary | ICD-10-CM

## 2019-09-28 DIAGNOSIS — Z3A01 Less than 8 weeks gestation of pregnancy: Secondary | ICD-10-CM | POA: Diagnosis not present

## 2019-09-28 DIAGNOSIS — R110 Nausea: Secondary | ICD-10-CM | POA: Diagnosis not present

## 2019-09-28 DIAGNOSIS — F17210 Nicotine dependence, cigarettes, uncomplicated: Secondary | ICD-10-CM | POA: Insufficient documentation

## 2019-09-28 DIAGNOSIS — R1030 Lower abdominal pain, unspecified: Secondary | ICD-10-CM | POA: Diagnosis not present

## 2019-09-28 DIAGNOSIS — O4691 Antepartum hemorrhage, unspecified, first trimester: Secondary | ICD-10-CM | POA: Insufficient documentation

## 2019-09-28 LAB — COMPREHENSIVE METABOLIC PANEL
ALT: 13 U/L (ref 0–44)
AST: 17 U/L (ref 15–41)
Albumin: 4.1 g/dL (ref 3.5–5.0)
Alkaline Phosphatase: 59 U/L (ref 38–126)
Anion gap: 10 (ref 5–15)
BUN: 7 mg/dL (ref 6–20)
CO2: 22 mmol/L (ref 22–32)
Calcium: 8.9 mg/dL (ref 8.9–10.3)
Chloride: 103 mmol/L (ref 98–111)
Creatinine, Ser: 0.56 mg/dL (ref 0.44–1.00)
GFR calc Af Amer: >60 mL/min (ref >60)
GFR calc non Af Amer: >60 mL/min (ref >60)
Glucose, Bld: 87 mg/dL (ref 70–99)
Potassium: 3.4 mmol/L — ABNORMAL LOW (ref 3.5–5.1)
Sodium: 135 mmol/L (ref 135–145)
Total Bilirubin: 0.8 mg/dL (ref 0.3–1.2)
Total Protein: 6.7 g/dL (ref 6.5–8.1)

## 2019-09-28 LAB — URINALYSIS, COMPLETE (UACMP) WITH MICROSCOPIC
Glucose, UA: NEGATIVE mg/dL
Hgb urine dipstick: NEGATIVE
Ketones, ur: NEGATIVE mg/dL
Nitrite: NEGATIVE
Protein, ur: 30 mg/dL — AB
Specific Gravity, Urine: 1.031 — ABNORMAL HIGH (ref 1.005–1.030)
pH: 5 (ref 5.0–8.0)

## 2019-09-28 LAB — CBC
HCT: 46.7 % — ABNORMAL HIGH (ref 36.0–46.0)
Hemoglobin: 16.1 g/dL — ABNORMAL HIGH (ref 12.0–15.0)
MCH: 31.2 pg (ref 26.0–34.0)
MCHC: 34.5 g/dL (ref 30.0–36.0)
MCV: 90.5 fL (ref 80.0–100.0)
Platelets: 398 10*3/uL (ref 150–400)
RBC: 5.16 MIL/uL — ABNORMAL HIGH (ref 3.87–5.11)
RDW: 12.3 % (ref 11.5–15.5)
WBC: 14.3 10*3/uL — ABNORMAL HIGH (ref 4.0–10.5)
nRBC: 0 % (ref 0.0–0.2)

## 2019-09-28 LAB — WET PREP, GENITAL
Clue Cells Wet Prep HPF POC: NONE SEEN
Sperm: NONE SEEN
Trich, Wet Prep: NONE SEEN
Yeast Wet Prep HPF POC: NONE SEEN

## 2019-09-28 LAB — HCG, QUANTITATIVE, PREGNANCY: hCG, Beta Chain, Quant, S: 83646 m[IU]/mL — ABNORMAL HIGH

## 2019-09-28 LAB — POCT PREGNANCY, URINE: Preg Test, Ur: POSITIVE — AB

## 2019-09-28 LAB — CHLAMYDIA/NGC RT PCR (ARMC ONLY)
Chlamydia Tr: NOT DETECTED
N gonorrhoeae: NOT DETECTED

## 2019-09-28 LAB — ABO/RH: ABO/RH(D): O POS

## 2019-09-28 LAB — HIV ANTIBODY (ROUTINE TESTING W REFLEX): HIV Screen 4th Generation wRfx: NONREACTIVE

## 2019-09-28 MED ORDER — ONDANSETRON 4 MG PO TBDP
4.0000 mg | ORAL_TABLET | Freq: Once | ORAL | Status: AC
  Administered 2019-09-28: 4 mg via ORAL
  Filled 2019-09-28: qty 1

## 2019-09-28 MED ORDER — CEPHALEXIN 500 MG PO CAPS
500.0000 mg | ORAL_CAPSULE | Freq: Two times a day (BID) | ORAL | 0 refills | Status: AC
Start: 2019-09-28 — End: 2019-10-05

## 2019-09-28 NOTE — ED Notes (Signed)
MD Monks at bedside  

## 2019-09-28 NOTE — ED Provider Notes (Signed)
[facility] Emergency Department Provider Note  ____________________________________________   First MD Initiated Contact with Patient 09/28/19 [phone]     (approximate)  I have reviewed the triage vital signs and the nursing notes.  History  Chief Complaint Vaginal Bleeding and Abdominal Cramping    HPI Suzanne Nelson is a 24 y.o. female G3P0020, LMP ~3/30, who presents to the emergency department for lower abdominal pain and vaginal bleeding.  Patient states that she first developed abdominal pain this morning around 5 AM.  Located to the lower abdomen and right-sided abdomen.  Describes the pain as cramping, moderate in severity, no radiation, no alleviating/aggravating components.  She went to the restroom and when she wiped she noticed a very small amount of blood on the toilet paper.  Reports a positive home pregnancy test 3 days ago, therefore was concerned and presented to the ER for further evaluation due to her hx of prior miscarriage x 2.  Also reports some increased white, sticky vaginal discharge over the last 2 to 3 days, not malodorous, not pruritic.  Nausea, but no vomiting.  She does desire STI screening.  History of 2 prior miscarriages.  Patient is excited about this pregnancy.  States the last day of her last period was 3/30, however she is usually irregular.   Past Medical Hx Past Medical History:  Diagnosis Date  . ADHD (attention deficit hyperactivity disorder)   . Anxiety   . Asthma   . Asthma   . Depression   . Migraine   . No pertinent past medical history   . Panic attack     Problem List Patient Active Problem List   Diagnosis Date Noted  . Hydronephrosis 05/18/2015  . Calculus of distal left ureter 05/18/2015  . Left ureteral stone 05/18/2015  . Major depressive disorder, single episode, moderate ([place]) 03/16/2011  . 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 Oppositional defiant disorder  03/16/2011    Past Surgical Hx Past Surgical History:  Procedure Laterality Date  . CYSTO N/A 05/19/2015   Procedure: CYSTO;  Surgeon: Hollice Espy, MD;  Location: [facility] ORS;  Service: Urology;  Laterality: N/A;  . NO PAST SURGERIES    . STENT PLACE LEFT URETER ([facility] HX)    . URETEROSCOPY WITH HOLMIUM LASER LITHOTRIPSY Left 05/19/2015   Procedure: URETEROSCOPY WITH HOLMIUM LASER LITHOTRIPSY;  Surgeon: Hollice Espy, MD;  Location: [facility] ORS;  Service: Urology;  Laterality: Left;    Medications Prior to Admission medications   Medication Sig Start Date End Date Taking? Authorizing Provider  albuterol (PROVENTIL HFA;VENTOLIN HFA) 108 (90 Base) MCG/ACT inhaler Inhale 2 puffs into the lungs every 6 (six) hours as needed for wheezing or shortness of breath. 05/22/18   Laban Emperor, PA-C  albuterol (VENTOLIN HFA) 108 (90 Base) MCG/ACT inhaler Inhale 2 puffs into the lungs every 6 (six) hours as needed for wheezing or shortness of breath. 10/31/18   Nena Polio, MD  cyclobenzaprine (FLEXERIL) 10 MG tablet Take 1 tablet (10 mg total) by mouth 3 (three) times daily as needed for muscle spasms. 02/06/19   Rudene Re, MD  oxybutynin (DITROPAN) 5 MG tablet Take 1 tablet (5 mg total) by mouth every 8 (eight) hours as needed for bladder spasms. 05/19/15   Hollice Espy, MD  phenazopyridine (PYRIDIUM) 200 MG tablet Take 1 tablet (200 mg total) by mouth 3 (three) times daily as needed for pain. 06/15/19   Menshew, Dannielle Karvonen, PA-C    Allergies  Onion, Adhesive [tape], and Tape  Family Hx Family History  Problem Relation Age of Onset  . Diabetes Mellitus II Mother   . Bipolar disorder Mother     Social Hx Social History   Tobacco Use  . Smoking status: Current Every Day Smoker    Packs/day: 1.00    Types: Cigarettes  . Smokeless tobacco: Never Used  . Tobacco comment: quiting now  Substance Use Topics  . Alcohol use: Yes    Comment: socially  . Drug use: Not Currently     Frequency: 1.0 times per week    Types: Marijuana    Comment: Last use one month age     Review of Systems  Constitutional: Negative for fever. Negative for chills. Eyes: Negative for visual changes. ENT: Negative for sore throat. Cardiovascular: Negative for chest pain. Respiratory: Negative for shortness of breath. Gastrointestinal: Positive for nausea, abdominal pain. Genitourinary: Positive for vaginal bleeding. Musculoskeletal: Negative for leg swelling. Skin: Negative for rash. Neurological: Negative for headaches.   Physical Exam  Vital Signs: ED Triage Vitals [09/28/19 0737]  Enc Vitals Group     BP 119/82     Pulse Rate 88     Resp 16     Temp 98.1 F (36.7 C)     Temp Source Oral     SpO2 97 %     Weight 131 lb (59.4 kg)     Height 5\' 2"  (1.575 m)     Head Circumference      Peak Flow      Pain Score 4     Pain Loc      Pain Edu?      Excl. in GC?     Constitutional: Alert and oriented. Well appearing. NAD.  Head: Normocephalic. Atraumatic. Eyes: Conjunctivae clear. Sclera anicteric. Pupils equal and symmetric. Nose: No masses or lesions. No congestion or rhinorrhea. Mouth/Throat: Wearing mask.  Neck: No stridor. Trachea midline.  Cardiovascular: Normal rate, regular rhythm. Extremities well perfused. Respiratory: Normal respiratory effort.  Lungs CTAB. Gastrointestinal: Soft. Non-distended. Non-tender throughout to deep palpation.  Genitourinary: RN chaperone present.  Normal external genitalia.  No rashes or lesions.  Moderate amount of whitish/yellow discharge in the vaginal canal. No CMT.  Musculoskeletal: No lower extremity edema. No deformities. Neurologic:  Normal speech and language. No gross focal or lateralizing neurologic deficits are appreciated.  Skin: Skin is warm, dry and intact. No rash noted. Psychiatric: Mood and affect are appropriate for situation.   Radiology  Personally reviewed available imaging myself.   TVUS -  IMPRESSION: 1. Unremarkable single intrauterine pregnancy. Dates are earlier than predicted by last menstrual.    Procedures  Procedure(s) performed (including critical care):  Procedures   Initial Impression / Assessment and Plan / MDM / ED Course  24 y.o. female G3P0020, LMP ~3/30 who presents to the ED for lower abdominal pain, light vaginal bleeding since this morning  Ddx: ectopic, threatened miscarriage, miscarriage, vaginal bleeding in early pregnancy, pelvic infection  Will plan for labs, imaging, pelvic exam, symptom control, reassess  Ultrasound reveals unremarkable single intrauterine pregnancy, dates earlier than predicted by LMP, but this may be due to the irregularity of her periods.  Wet prep with WBCs, but negative for yeast, trichomoniasis, clue cells.  UA equivocal, but with rare bacteria, in the setting of her pregnancy will opt to treat.  She is in agreement with this.  Otherwise, advised outpatient OB follow-up, initiation of multivitamins, given return precautions.  She is aware  that the remainder of her STD testing will result after discharge and she will receive a phone call if these are abnormal, voices understanding of this.  Stable for discharge.   _______________________________   As part of my medical decision making I have reviewed available labs, radiology tests, reviewed old records/performed chart review.    Final Clinical Impression(s) / ED Diagnosis  Final diagnoses:  Vaginal bleeding affecting early pregnancy  Pregnancy related bilateral lower abdominal cramping, antepartum       Note:  This document was prepared using Dragon voice recognition software and may include unintentional dictation errors.   Miguel Aschoff., MD 09/28/19 1140

## 2019-09-28 NOTE — ED Triage Notes (Signed)
Says abd cramping and vaginal bleeding since 530am.  Says had positive home preg test 3 days ago.

## 2019-09-28 NOTE — Discharge Instructions (Signed)
Thank you for letting us take care of you in the emergency department today.   We have written you a prescription for Keflex, antibiotic to treat the bacteria seen in your urine.  If the remainder of your swabs/tests are positive, you will receive a phone call about these results.  Please also obtain and start taking an over-the-counter prenatal multivitamin.  Follow-up with the OB/GYN doctors in the clinic.  Please return to the ER for any new or worsening symptoms.

## 2019-09-28 NOTE — ED Notes (Signed)
Pt verbalized understanding of discharge instructions. NAD at this time. 

## 2019-09-28 NOTE — ED Notes (Signed)
Pt states spotting that started yesterday and cramping that started at approx 5 am.

## 2019-09-29 LAB — RPR: RPR Ser Ql: NONREACTIVE

## 2019-10-07 ENCOUNTER — Encounter: Payer: Self-pay | Admitting: Emergency Medicine

## 2019-10-07 ENCOUNTER — Other Ambulatory Visit: Payer: Self-pay

## 2019-10-07 ENCOUNTER — Emergency Department
Admission: EM | Admit: 2019-10-07 | Discharge: 2019-10-07 | Disposition: A | Discharge status: Home / Self Care | Payer: Medicaid Other | Attending: Emergency Medicine | Admitting: Emergency Medicine

## 2019-10-07 DIAGNOSIS — Z5321 Procedure and treatment not carried out due to patient leaving prior to being seen by health care provider: Secondary | ICD-10-CM | POA: Insufficient documentation

## 2019-10-07 DIAGNOSIS — R110 Nausea: Secondary | ICD-10-CM | POA: Insufficient documentation

## 2019-10-07 DIAGNOSIS — R42 Dizziness and giddiness: Secondary | ICD-10-CM | POA: Insufficient documentation

## 2019-10-07 LAB — COMPREHENSIVE METABOLIC PANEL
ALT: 14 U/L (ref 0–44)
AST: 16 U/L (ref 15–41)
Albumin: 4.1 g/dL (ref 3.5–5.0)
Alkaline Phosphatase: 55 U/L (ref 38–126)
Anion gap: 8 (ref 5–15)
BUN: 5 mg/dL — ABNORMAL LOW (ref 6–20)
CO2: 21 mmol/L — ABNORMAL LOW (ref 22–32)
Calcium: 9 mg/dL (ref 8.9–10.3)
Chloride: 106 mmol/L (ref 98–111)
Creatinine, Ser: 0.43 mg/dL — ABNORMAL LOW (ref 0.44–1.00)
GFR calc Af Amer: >60 mL/min (ref >60)
GFR calc non Af Amer: >60 mL/min (ref >60)
Glucose, Bld: 87 mg/dL (ref 70–99)
Potassium: 3.3 mmol/L — ABNORMAL LOW (ref 3.5–5.1)
Sodium: 135 mmol/L (ref 135–145)
Total Bilirubin: 0.7 mg/dL (ref 0.3–1.2)
Total Protein: 6.8 g/dL (ref 6.5–8.1)

## 2019-10-07 LAB — URINALYSIS, COMPLETE (UACMP) WITH MICROSCOPIC
Bacteria, UA: NONE SEEN
Bilirubin Urine: NEGATIVE
Glucose, UA: NEGATIVE mg/dL
Hgb urine dipstick: NEGATIVE
Ketones, ur: 20 mg/dL — AB
Leukocytes,Ua: NEGATIVE
Nitrite: NEGATIVE
Protein, ur: NEGATIVE mg/dL
Specific Gravity, Urine: 1.018 (ref 1.005–1.030)
pH: 6 (ref 5.0–8.0)

## 2019-10-07 LAB — LIPASE, BLOOD: Lipase: 23 U/L (ref 11–51)

## 2019-10-07 LAB — CBC
HCT: 41.7 % (ref 36.0–46.0)
Hemoglobin: 14.6 g/dL (ref 12.0–15.0)
MCH: 31.1 pg (ref 26.0–34.0)
MCHC: 35 g/dL (ref 30.0–36.0)
MCV: 88.7 fL (ref 80.0–100.0)
Platelets: 320 10*3/uL (ref 150–400)
RBC: 4.7 MIL/uL (ref 3.87–5.11)
RDW: 12 % (ref 11.5–15.5)
WBC: 15.5 10*3/uL — ABNORMAL HIGH (ref 4.0–10.5)
nRBC: 0 % (ref 0.0–0.2)

## 2019-10-07 NOTE — ED Triage Notes (Signed)
Patient reports had period of dizziness earlier on Saturday and was checked out by EMS and found that her CBG was 60.  EMS encouraged patient to eat and drink.  Patient reports she has been unable to eat or drink, states becoming nauseated and dry heaving.  Patient also reports that dizziness has returned.

## 2019-10-10 ENCOUNTER — Telehealth: Payer: Self-pay | Admitting: Emergency Medicine

## 2019-10-10 NOTE — Telephone Encounter (Signed)
Called patient due to lwot to inquire about condition and follow up plans.  She says she is feeling well now and keeping fluids and food down.  She is taking an otc antiemetic that is safe in pregnancy and  Has scheduled a visit with [facility].  I told her that if she gets worse she should return.

## 2019-10-17 ENCOUNTER — Ambulatory Visit (INDEPENDENT_AMBULATORY_CARE_PROVIDER_SITE_OTHER): Payer: Medicaid Other | Admitting: Obstetrics

## 2019-10-17 ENCOUNTER — Other Ambulatory Visit: Payer: Self-pay

## 2019-10-17 ENCOUNTER — Encounter: Payer: Self-pay | Admitting: Obstetrics

## 2019-10-17 ENCOUNTER — Other Ambulatory Visit (HOSPITAL_COMMUNITY)
Admission: RE | Admit: 2019-10-17 | Discharge: 2019-10-17 | Disposition: A | Discharge status: Home / Self Care | Payer: Medicaid Other | Source: Ambulatory Visit | Attending: Obstetrics | Admitting: Obstetrics

## 2019-10-17 VITALS — BP 80/64 | Wt 123.0 lb

## 2019-10-17 DIAGNOSIS — Z3A08 8 weeks gestation of pregnancy: Secondary | ICD-10-CM | POA: Diagnosis not present

## 2019-10-17 DIAGNOSIS — Z3480 Encounter for supervision of other normal pregnancy, unspecified trimester: Secondary | ICD-10-CM | POA: Insufficient documentation

## 2019-10-17 DIAGNOSIS — O219 Vomiting of pregnancy, unspecified: Secondary | ICD-10-CM

## 2019-10-17 DIAGNOSIS — Z3481 Encounter for supervision of other normal pregnancy, first trimester: Secondary | ICD-10-CM | POA: Insufficient documentation

## 2019-10-17 MED ORDER — ONDANSETRON 4 MG PO TBDP: 4.0000 mg | ORAL_TABLET | Freq: Four times a day (QID) | ORAL | 0 refills | Status: DC | PRN

## 2019-10-17 NOTE — Progress Notes (Signed)
NOB- pt has a lot of morning sickness, has a lot of trouble keeping down fluids/food

## 2019-10-17 NOTE — Progress Notes (Signed)
New Obstetric Patient H&P    Chief Complaint: "Desires prenatal care"   History of Present Illness: Patient is a 24 y.o. [id number] Not Hispanic or Latino female, LMP 07/17/2019 presents with amenorrhea and positive home pregnancy test. Based on her  LMP, her EDD is Estimated Date of Delivery: 05/23/20 and her EGA is [redacted]w[redacted]d. Cycles are 6. days, irregular, and occur approximately every : 28 days. Her last pap smear was a few years ago and was no abnormalities.    She had a urine pregnancy test which was positive 6 or 7 day(s)  ago. Her last menstrual period was normal and lasted for  5 or 6 day(s). Since her LMP she claims she has experienced nausea and vomiting. She denies vaginal bleeding. Her past medical history is noncontributory. Her prior pregnancies are notable for two early SABs  Since her LMP, she admits to the use of tobacco products  no She claims she has gained   no pounds since the start of her pregnancy.  There are cats in the home in the home  yes If yes Outdoor She admits close contact with children on a regular basis  no  She has had chicken pox in the past no She has had Tuberculosis exposures, symptoms, or previously tested positive for TB   no Current or past history of domestic violence. no  Genetic Screening/Teratology Counseling: (Includes patient, baby's father, or anyone in either family with:)   1. Patient's age >/= 28 at [facility]  no 2. Thalassemia ([place], [place], Mediterranean, or Asian background): MCV<80  no 3. Neural tube defect (meningomyelocele, spina bifida, anencephaly)  no 4. Congenital heart defect  no  5. Down syndrome  no 6. Tay-Sachs (Jewish, [place]  no 7. Canavan's Disease  no 8. Sickle cell disease or trait (African)  no  9. Hemophilia or other blood disorders  no  10. Muscular dystrophy  no  11. Cystic fibrosis  no  12. Huntington's Chorea  no  13. Mental retardation/autism  no 14. Other inherited genetic or chromosomal disorder   no 15. Maternal metabolic disorder (DM, PKU, etc)  no 16. Patient or FOB with a child with a birth defect not listed above no  16a. Patient or FOB with a birth defect themselves no 17. Recurrent pregnancy loss, or stillbirth  Reports two early SABs- never had any prenatal visits for either, but passed them at the ED obth times  18. Any medications since LMP other than prenatal vitamins (include vitamins, supplements, OTC meds, drugs, alcohol)  no 19. Any other genetic/environmental exposure to discuss  no  Infection History:   1. Lives with someone with TB or TB exposed  no  2. Patient or partner has history of genital herpes  no 3. Rash or viral illness since LMP  no 4. History of STI (GC, CT, HPV, syphilis, HIV)  no 5. History of recent travel :  no  Other pertinent information:  She has a lengthy psychiatric hx, and sees a psychiatrist in [place]. Was previously on Abilify. According tointake today, is on Lexapro. Hx of "cutting", anxiety and depression.     Review of Systems:10 point review of systems negative unless otherwise noted in HPI  Past Medical History:  Past Medical History:  Diagnosis Date  . ADHD (attention deficit hyperactivity disorder)   . Anxiety   . Asthma   . Asthma   . Depression   . Migraine   . No pertinent past medical  history   . Panic attack     Past Surgical History:  Past Surgical History:  Procedure Laterality Date  . CYSTO N/A 05/19/2015   Procedure: CYSTO;  Surgeon: Vanna Scotland, MD;  Location: [facility] ORS;  Service: Urology;  Laterality: N/A;  . NO PAST SURGERIES    . STENT PLACE LEFT URETER ([facility] HX)    . URETEROSCOPY WITH HOLMIUM LASER LITHOTRIPSY Left 05/19/2015   Procedure: URETEROSCOPY WITH HOLMIUM LASER LITHOTRIPSY;  Surgeon: Vanna Scotland, MD;  Location: [facility] ORS;  Service: Urology;  Laterality: Left;    Gynecologic History: Patient's last menstrual period was 07/17/2019.  Obstetric History: G3P0020  Family History:  Family  History  Problem Relation Age of Onset  . Diabetes Mellitus II Mother   . Bipolar disorder Mother   . Breast cancer Paternal Grandmother        not sure of age    Social History:  Social History   Socioeconomic History  . Marital status: Single    Spouse name: Not on file  . Number of children: Not on file  . Years of education: Not on file  . Highest education level: Not on file  Occupational History  . Occupation: Consulting civil engineer  Tobacco Use  . Smoking status: Current Every Day Smoker    Packs/day: 1.00    Types: Cigarettes  . Smokeless tobacco: Never Used  . Tobacco comment: quiting now  Vaping Use  . Vaping Use: Former  Substance and Sexual Activity  . Alcohol use: Not Currently    Comment: socially  . Drug use: Yes    Frequency: 1.0 times per week    Types: Marijuana    Comment: Last use one month age  . Sexual activity: Not Currently    Partners: Male    Birth control/protection: None    Comment: Pt uses Depo shot had times 1 moth ago  Other Topics Concern  . Not on file  Social History Narrative   ** Merged History Encounter **       ** Merged History Encounter **       Lives at home, independant   Social Determinants of Health   Financial Resource Strain:   . Difficulty of Paying Living Expenses:   Food Insecurity:   . Worried About Programme researcher, broadcasting/film/video in the Last Year:   . Barista in the Last Year:   Transportation Needs:   . Freight forwarder (Medical):   Marland Kitchen Lack of Transportation (Non-Medical):   Physical Activity:   . Days of Exercise per Week:   . Minutes of Exercise per Session:   Stress:   . Feeling of Stress :   Social Connections:   . Frequency of Communication with Friends and Family:   . Frequency of Social Gatherings with Friends and Family:   . Attends Religious Services:   . Active Member of Clubs or Organizations:   . Attends Banker Meetings:   Marland Kitchen Marital Status:   Intimate Partner Violence:   . Fear of  Current or Ex-Partner:   . Emotionally Abused:   Marland Kitchen Physically Abused:   . Sexually Abused:     Allergies:  Allergies  Allergen Reactions  . Onion Swelling  . Adhesive [Tape] Other (See Comments)    Steri-Strips & Silk tape  . Tape Rash    Medications: Prior to Admission medications   Medication Sig Start Date End Date Taking? Authorizing Provider  albuterol (PROVENTIL HFA;VENTOLIN HFA) 108 (90 Base) MCG/ACT  inhaler Inhale 2 puffs into the lungs every 6 (six) hours as needed for wheezing or shortness of breath. 05/22/18  Yes Enid Derry, PA-C  escitalopram (LEXAPRO) 20 MG tablet  09/04/19   [provider]    Physical Exam Vitals: Blood pressure (!) 80/64, weight 123 lb (55.8 kg), last menstrual period 07/17/2019.  General: NAD HEENT: normocephalic, anicteric Thyroid: no enlargement, no palpable nodules Pulmonary: No increased work of breathing, CTAB Cardiovascular: RRR, distal pulses 2+ Abdomen: NABS, soft, non-tender, non-distended.  Umbilicus without lesions.  No hepatomegaly, splenomegaly or masses palpable. No evidence of hernia  Genitourinary:  External: Normal external female genitalia.  Normal urethral meatus, normal  Bartholin's and Skene's glands.    Vagina: Normal vaginal mucosa, no evidence of prolapse.    Cervix: Grossly normal in appearance, no bleeding  Uterus: anteverted Non-enlarged, mobile, normal contour.  No CMT  Adnexa: ovaries non-enlarged, no adnexal masses  Rectal: deferred Extremities: no edema, erythema, or tenderness Neurologic: Grossly intact Psychiatric: mood appropriate, affect full   Assessment: 24 y.o. G3P0020 at [redacted]w[redacted]d presenting to initiate prenatal care  Plan: 1) Avoid alcoholic beverages. 2) Patient encouraged not to smoke.  3) Discontinue the use of all non-medicinal drugs and chemicals.  4) Take prenatal vitamins daily.  5) Nutrition, food safety (fish, cheese advisories, and high nitrite foods) and exercise discussed. 6)  Hospital and practice style discussed with cross coverage system.  7) Genetic Screening, such as with 1st Trimester Screening, cell free fetal DNA, AFP testing, and Ultrasound, as well as with amniocentesis and CVS as appropriate, is discussed with patient. At the conclusion of today's visit patient undecided genetic testing 8) Patient is asked about travel to areas at risk for the [place] virus, and counseled to avoid travel and exposure to mosquitoes or sexual partners who may have themselves been exposed to the virus. Testing is discussed, and will be ordered as appropriate.  Pap smear and STI labs today, urine culture. RTC in 2 weeks for ROB, dating ultrasound and bloodwork, including Inheritest. RX for Zofran provided at her request. Mirna Mires, CNM  10/17/2019 12:43 PM

## 2019-10-17 NOTE — Patient Instructions (Signed)
First Trimester of Pregnancy  The first trimester of pregnancy is from week 1 until the end of week 13 (months 1 through 3). During this time, your baby will begin to develop inside you. At 6-8 weeks, the eyes and face are formed, and the heartbeat can be seen on ultrasound. At the end of 12 weeks, all the baby's organs are formed. Prenatal care is all the medical care you receive before the birth of your baby. Make sure you get good prenatal care and follow all of your doctor's instructions. Follow these instructions at home: Medicines  Take over-the-counter and prescription medicines only as told by your doctor. Some medicines are safe and some medicines are not safe during pregnancy.  Take a prenatal vitamin that contains at least 600 micrograms (mcg) of folic acid.  If you have trouble pooping (constipation), take medicine that will make your stool soft (stool softener) if your doctor approves. Eating and drinking   Eat regular, healthy meals.  Your doctor will tell you the amount of weight gain that is right for you.  Avoid raw meat and uncooked cheese.  If you feel sick to your stomach (nauseous) or throw up (vomit): ? Eat 4 or 5 small meals a day instead of 3 large meals. ? Try eating a few soda crackers. ? Drink liquids between meals instead of during meals.  To prevent constipation: ? Eat foods that are high in fiber, like fresh fruits and vegetables, whole grains, and beans. ? Drink enough fluids to keep your pee (urine) clear or pale yellow. Activity  Exercise only as told by your doctor. Stop exercising if you have cramps or pain in your lower belly (abdomen) or low back.  Do not exercise if it is too hot, too humid, or if you are in a place of great height (high altitude).  Try to avoid standing for long periods of time. Move your legs often if you must stand in one place for a long time.  Avoid heavy lifting.  Wear low-heeled shoes. Sit and stand up  straight.  You can have sex unless your doctor tells you not to. Relieving pain and discomfort  Wear a good support bra if your breasts are sore.  Take warm water baths (sitz baths) to soothe pain or discomfort caused by hemorrhoids. Use hemorrhoid cream if your doctor says it is okay.  Rest with your legs raised if you have leg cramps or low back pain.  If you have puffy, bulging veins (varicose veins) in your legs: ? Wear support hose or compression stockings as told by your doctor. ? Raise (elevate) your feet for 15 minutes, 3-4 times a day. ? Limit salt in your food. Prenatal care  Schedule your prenatal visits by the twelfth week of pregnancy.  Write down your questions. Take them to your prenatal visits.  Keep all your prenatal visits as told by your doctor. This is important. Safety  Wear your seat belt at all times when driving.  Make a list of emergency phone numbers. The list should include numbers for family, friends, the hospital, and police and fire departments. General instructions  Ask your doctor for a referral to a local prenatal class. Begin classes no later than at the start of month 6 of your pregnancy.  Ask for help if you need counseling or if you need help with nutrition. Your doctor can give you advice or tell you where to go for help.  Do not use hot tubs, steam   rooms, or saunas.  Do not douche or use tampons or scented sanitary pads.  Do not cross your legs for long periods of time.  Avoid all herbs and alcohol. Avoid drugs that are not approved by your doctor.  Do not use any tobacco products, including cigarettes, chewing tobacco, and electronic cigarettes. If you need help quitting, ask your doctor. You may get counseling or other support to help you quit.  Avoid cat litter boxes and soil used by cats. These carry germs that can cause birth defects in the baby and can cause a loss of your baby (miscarriage) or stillbirth.  Visit your dentist.  At home, brush your teeth with a soft toothbrush. Be gentle when you floss. Contact a doctor if:  You are dizzy.  You have mild cramps or pressure in your lower belly.  You have a nagging pain in your belly area.  You continue to feel sick to your stomach, you throw up, or you have watery poop (diarrhea).  You have a bad smelling fluid coming from your vagina.  You have pain when you pee (urinate).  You have increased puffiness (swelling) in your face, hands, legs, or ankles. Get help right away if:  You have a fever.  You are leaking fluid from your vagina.  You have spotting or bleeding from your vagina.  You have very bad belly cramping or pain.  You gain or lose weight rapidly.  You throw up blood. It may look like coffee grounds.  You are around people who have German measles, fifth disease, or chickenpox.  You have a very bad headache.  You have shortness of breath.  You have any kind of trauma, such as from a fall or a car accident. Summary  The first trimester of pregnancy is from week 1 until the end of week 13 (months 1 through 3).  To take care of yourself and your unborn baby, you will need to eat healthy meals, take medicines only if your doctor tells you to do so, and do activities that are safe for you and your baby.  Keep all follow-up visits as told by your doctor. This is important as your doctor will have to ensure that your baby is healthy and growing well. This information is not intended to replace advice given to you by your health care provider. Make sure you discuss any questions you have with your health care provider. Document Revised: 07/27/2018 Document Reviewed: 04/13/2016 Elsevier Patient Education  2020 Elsevier Inc.  

## 2019-10-18 ENCOUNTER — Telehealth: Payer: Self-pay

## 2019-10-18 ENCOUNTER — Other Ambulatory Visit: Payer: Self-pay | Admitting: Obstetrics

## 2019-10-18 NOTE — Telephone Encounter (Signed)
Tamika aware orders are in.

## 2019-10-18 NOTE — Telephone Encounter (Signed)
Suzanne Nelson will be here this afternoon. Let her know, so she knows what to do the next time

## 2019-10-18 NOTE — Addendum Note (Signed)
Addended by: Mirna Mires on: 10/18/2019 02:05 PM   Modules accepted: Orders

## 2019-10-18 NOTE — Telephone Encounter (Signed)
Tamika from [facility] cytology calling; needs order for cytology gc/chlm not micro.  Adv order would be put in today.

## 2019-10-18 NOTE — Telephone Encounter (Signed)
Laurence Aly.  I cancelled and then reordered the correct test.  Claris Che

## 2019-10-19 ENCOUNTER — Other Ambulatory Visit: Payer: Self-pay | Admitting: Obstetrics

## 2019-10-19 ENCOUNTER — Encounter: Payer: Self-pay | Admitting: Obstetrics

## 2019-10-19 DIAGNOSIS — B9689 Other specified bacterial agents as the cause of diseases classified elsewhere: Secondary | ICD-10-CM | POA: Insufficient documentation

## 2019-10-19 DIAGNOSIS — O2340 Unspecified infection of urinary tract in pregnancy, unspecified trimester: Secondary | ICD-10-CM

## 2019-10-19 DIAGNOSIS — N760 Acute vaginitis: Secondary | ICD-10-CM

## 2019-10-19 DIAGNOSIS — R300 Dysuria: Secondary | ICD-10-CM

## 2019-10-19 DIAGNOSIS — O219 Vomiting of pregnancy, unspecified: Secondary | ICD-10-CM

## 2019-10-19 DIAGNOSIS — Z3A08 8 weeks gestation of pregnancy: Secondary | ICD-10-CM

## 2019-10-19 DIAGNOSIS — Z3480 Encounter for supervision of other normal pregnancy, unspecified trimester: Secondary | ICD-10-CM

## 2019-10-19 HISTORY — DX: Acute vaginitis: N76.0

## 2019-10-19 HISTORY — DX: Other specified bacterial agents as the cause of diseases classified elsewhere: B96.89

## 2019-10-19 LAB — URINE CULTURE

## 2019-10-19 LAB — CERVICOVAGINAL ANCILLARY ONLY
Bacterial Vaginitis (gardnerella): POSITIVE — AB
Chlamydia: NEGATIVE
Comment: NEGATIVE
Comment: NEGATIVE
Comment: NORMAL
Neisseria Gonorrhea: NEGATIVE

## 2019-10-19 LAB — CYTOLOGY - PAP: Diagnosis: NEGATIVE

## 2019-10-19 MED ORDER — METRONIDAZOLE 0.75 % VA GEL: 1.0000 | Freq: Every day | VAGINAL | 1 refills | Status: AC

## 2019-10-19 MED ORDER — ONDANSETRON 4 MG PO TBDP: 4.0000 mg | ORAL_TABLET | Freq: Four times a day (QID) | ORAL | 0 refills | Status: DC | PRN

## 2019-10-19 MED ORDER — PHENAZOPYRIDINE HCL 200 MG PO TABS: 200.0000 mg | ORAL_TABLET | Freq: Three times a day (TID) | ORAL | 1 refills | Status: DC | PRN

## 2019-10-19 NOTE — Telephone Encounter (Signed)
Pt calling; pain c urination; burns very bad; hurts to stand.  [phone]  Pt aware antibx sent in; states is unable to keep a pill down even with taking c food.  Is there a liquid form?  Can she have a dr's note for being out of work today d/t the pain?

## 2019-10-19 NOTE — Progress Notes (Signed)
  Rx for Metrogel sent for the BV noted on her Aptima probe. Mirna Mires, CNM  10/19/2019 6:01 PM

## 2019-10-19 NOTE — Progress Notes (Signed)
Rx for Zofran sent in for patient.

## 2019-10-24 LAB — URINE DRUG PANEL 7
Amphetamines, Urine: NEGATIVE ng/mL
Barbiturate Quant, Ur: NEGATIVE ng/mL
Benzodiazepine Quant, Ur: NEGATIVE ng/mL
Cannabinoid Quant, Ur: POSITIVE — AB
Cocaine (Metab.): NEGATIVE ng/mL
Opiate Quant, Ur: NEGATIVE ng/mL
PCP Quant, Ur: NEGATIVE ng/mL

## 2019-10-28 ENCOUNTER — Other Ambulatory Visit: Payer: Self-pay

## 2019-10-28 ENCOUNTER — Encounter: Payer: Self-pay | Admitting: Emergency Medicine

## 2019-10-28 ENCOUNTER — Emergency Department
Admission: EM | Admit: 2019-10-28 | Discharge: 2019-10-28 | Disposition: A | Discharge status: Home / Self Care | Payer: Medicaid Other | Attending: Emergency Medicine | Admitting: Emergency Medicine

## 2019-10-28 DIAGNOSIS — J45909 Unspecified asthma, uncomplicated: Secondary | ICD-10-CM | POA: Insufficient documentation

## 2019-10-28 DIAGNOSIS — J029 Acute pharyngitis, unspecified: Secondary | ICD-10-CM

## 2019-10-28 DIAGNOSIS — F17210 Nicotine dependence, cigarettes, uncomplicated: Secondary | ICD-10-CM | POA: Diagnosis not present

## 2019-10-28 DIAGNOSIS — Z79899 Other long term (current) drug therapy: Secondary | ICD-10-CM | POA: Insufficient documentation

## 2019-10-28 DIAGNOSIS — H6507 Acute serous otitis media, recurrent, unspecified ear: Secondary | ICD-10-CM | POA: Insufficient documentation

## 2019-10-28 DIAGNOSIS — R0602 Shortness of breath: Secondary | ICD-10-CM | POA: Diagnosis present

## 2019-10-28 DIAGNOSIS — Z20822 Contact with and (suspected) exposure to covid-19: Secondary | ICD-10-CM | POA: Diagnosis not present

## 2019-10-28 DIAGNOSIS — H9209 Otalgia, unspecified ear: Secondary | ICD-10-CM | POA: Diagnosis not present

## 2019-10-28 DIAGNOSIS — Z7951 Long term (current) use of inhaled steroids: Secondary | ICD-10-CM | POA: Diagnosis not present

## 2019-10-28 DIAGNOSIS — H6500 Acute serous otitis media, unspecified ear: Secondary | ICD-10-CM

## 2019-10-28 LAB — SARS CORONAVIRUS 2 BY RT PCR (HOSPITAL ORDER, PERFORMED IN ~~LOC~~ HOSPITAL LAB): SARS Coronavirus 2: NEGATIVE

## 2019-10-28 LAB — GLUCOSE, CAPILLARY: Glucose-Capillary: 78 mg/dL (ref 70–99)

## 2019-10-28 LAB — GROUP A STREP BY PCR: Group A Strep by PCR: NOT DETECTED

## 2019-10-28 MED ORDER — ACETAMINOPHEN 500 MG PO TABS
1000.0000 mg | ORAL_TABLET | Freq: Once | ORAL | Status: AC
  Administered 2019-10-28: 1000 mg via ORAL
  Filled 2019-10-28: qty 2

## 2019-10-28 MED ORDER — AMOXICILLIN 875 MG PO TABS
875.0000 mg | ORAL_TABLET | Freq: Two times a day (BID) | ORAL | 0 refills | Status: DC
Start: 2019-10-28 — End: 2019-10-28

## 2019-10-28 MED ORDER — AMOXICILLIN 875 MG PO TABS
875.0000 mg | ORAL_TABLET | Freq: Two times a day (BID) | ORAL | 0 refills | Status: DC
Start: 2019-10-28 — End: 2019-11-14

## 2019-10-28 MED ORDER — AMOXICILLIN 500 MG PO CAPS
500.0000 mg | ORAL_CAPSULE | Freq: Once | ORAL | Status: AC
  Administered 2019-10-28: 500 mg via ORAL
  Filled 2019-10-28: qty 1

## 2019-10-28 NOTE — Discharge Instructions (Signed)
Follow-up with your regular doctor if not improving in 2 to 3 days.  Return emergency department worsening.  Take antibiotic as prescribed.  You may want to also eat yogurt to prevent yeast infections and diarrhea.

## 2019-10-28 NOTE — ED Notes (Signed)
Pt states her pain is 7/10 headache, 4/10 ears.  Hx of bilateral ear infections and migraines.  Sore throat for about one week, congestion worsening the past two days, ear pain and ringing getting worse the past few days.  Headache and blurred vision began today, unable to read the computer screen at work.  Pt reports dizziness as well but thinks it may be related to blood sugar which has been up and down since pregnancy.

## 2019-10-28 NOTE — ED Notes (Signed)
Snack and drinks provided

## 2019-10-28 NOTE — ED Notes (Signed)
No peripheral IV placed this visit.   Discharge instructions reviewed with patient. Questions fielded by this RN. Patient verbalizes understanding of instructions. Patient discharged home in stable condition per [place], [place] . No acute distress noted at time of discharge.   Pt has called for ride

## 2019-10-28 NOTE — ED Triage Notes (Signed)
Pt to ED via POV c/o sore throat, bilateral ear pain, and headaches. Pt states that she has hx/o Migraines but she is unable to take medication because she is [redacted] weeks pregnant. Pt denies fever or chills. Pt is in NAD.

## 2019-10-28 NOTE — ED Provider Notes (Signed)
[facility] Emergency Department Provider Note  ____________________________________________   First MD Initiated Contact with Patient 10/28/19 1724     (approximate)  I have reviewed the triage vital signs and the nursing notes.   HISTORY  Chief Complaint Sore Throat and Otalgia    HPI Suzanne Nelson is a 24 y.o. female presents emergency department complaining of headache, ear pain, sore throat for about 1 week.  Some congestion.  States her ears are ringing over the past few days.  Headache with blurred vision but does have history of migraines.  Patient denies vomiting.  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    Past Medical History:  Diagnosis Date  . ADHD (attention deficit hyperactivity disorder)   . Anxiety   . Asthma   . Asthma   . Depression   . Migraine   . No pertinent past medical history   . Panic attack     Patient Active Problem List   Diagnosis Date Noted  . BV (bacterial vaginosis) 10/19/2019  . Supervision of other normal pregnancy, antepartum 10/17/2019  . Hydronephrosis 05/18/2015  . Calculus of distal left ureter 05/18/2015  . Left ureteral stone 05/18/2015  . Major depressive disorder, single episode, moderate (HCC) 03/16/2011  . 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 Oppositional defiant disorder 03/16/2011    Past Surgical History:  Procedure Laterality Date  . CYSTO N/A 05/19/2015   Procedure: CYSTO;  Surgeon: Vanna Scotland, MD;  Location: [facility] ORS;  Service: Urology;  Laterality: N/A;  . NO PAST SURGERIES    . STENT PLACE LEFT URETER ([facility] HX)    . URETEROSCOPY WITH HOLMIUM LASER LITHOTRIPSY Left 05/19/2015   Procedure: URETEROSCOPY WITH HOLMIUM LASER LITHOTRIPSY;  Surgeon: Vanna Scotland, MD;  Location: [facility] ORS;  Service: Urology;  Laterality: Left;    Prior to Admission medications   Medication Sig Start Date End Date Taking? Authorizing Provider    amoxicillin (AMOXIL) 875 MG tablet Take 1 tablet (875 mg total) by mouth 2 (two) times daily. 10/28/19   Rene Sizelove, Roselyn Bering, PA-C  ondansetron (ZOFRAN ODT) 4 MG disintegrating tablet Take 1 tablet (4 mg total) by mouth every 6 (six) hours as needed for nausea. 10/19/19   Mirna Mires, CNM  phenazopyridine (PYRIDIUM) 200 MG tablet Take 1 tablet (200 mg total) by mouth 3 (three) times daily as needed for pain (urethral spasm). 10/19/19   Mirna Mires, CNM  albuterol (PROVENTIL HFA;VENTOLIN HFA) 108 (90 Base) MCG/ACT inhaler Inhale 2 puffs into the lungs every 6 (six) hours as needed for wheezing or shortness of breath. 05/22/18 10/28/19  Enid Derry, PA-C  escitalopram (LEXAPRO) 20 MG tablet  09/04/19 10/28/19  [provider]    Allergies Onion, Adhesive [tape], and Tape  Family History  Problem Relation Age of Onset  . Diabetes Mellitus II Mother   . Bipolar disorder Mother   . Breast cancer Paternal Grandmother        not sure of age    Social History Social History   Tobacco Use  . Smoking status: Current Every Day Smoker    Packs/day: 1.00    Types: Cigarettes  . Smokeless tobacco: Never Used  . Tobacco comment: quiting now  Vaping Use  . Vaping Use: Former  Substance Use Topics  . Alcohol use: Not Currently    Comment: socially  . Drug use: Yes    Frequency: 1.0 times per week    Types:  Marijuana    Comment: Last use one month age    Review of Systems  Constitutional: No fever/chills Eyes: No visual changes. ENT: Positive sore throat. Respiratory: Denies cough Cardiovascular: Denies chest pain Gastrointestinal: Denies abdominal pain Genitourinary: Negative for dysuria. Musculoskeletal: Negative for back pain. Skin: Negative for rash. Psychiatric: no mood changes,     ____________________________________________   PHYSICAL EXAM:  VITAL SIGNS: ED Triage Vitals  Enc Vitals Group     BP 10/28/19 1655 (!) 95/59     Pulse Rate 10/28/19 1655 88      Resp 10/28/19 1655 16     Temp 10/28/19 1655 98.3 F (36.8 C)     Temp Source 10/28/19 1655 Oral     SpO2 10/28/19 1655 99 %     Weight 10/28/19 1656 122 lb (55.3 kg)     Height 10/28/19 1656 5\' 2"  (1.575 m)     Head Circumference --      Peak Flow --      Pain Score 10/28/19 1656 6     Pain Loc --      Pain Edu? --      Excl. in GC? --     Constitutional: Alert and oriented. Well appearing and in no acute distress. Eyes: Conjunctivae are normal.  Head: Atraumatic. Ears: TMs are dull and pink bilaterally Nose: No congestion/rhinnorhea. Mouth/Throat: Mucous membranes are moist.  Throat is red posteriorly Neck:  supple no lymphadenopathy noted Cardiovascular: Normal rate, regular rhythm. Heart sounds are normal Respiratory: Normal respiratory effort.  No retractions, lungs c t a  GU: deferred Musculoskeletal: FROM all extremities, warm and well perfused Neurologic:  Normal speech and language.  Skin:  Skin is warm, dry and intact. No rash noted. Psychiatric: Mood and affect are normal. Speech and behavior are normal.  ____________________________________________   LABS (all labs ordered are listed, but only abnormal results are displayed)  Labs Reviewed  GROUP A STREP BY PCR  SARS CORONAVIRUS 2 BY RT PCR (HOSPITAL ORDER, PERFORMED IN [facility])  GLUCOSE, CAPILLARY   ____________________________________________   ____________________________________________  RADIOLOGY    ____________________________________________   PROCEDURES  Procedure(s) performed: No  Procedures    ____________________________________________   INITIAL IMPRESSION / ASSESSMENT AND PLAN / ED COURSE  Pertinent labs & imaging results that were available during my care of the patient were reviewed by me and considered in my medical decision making (see chart for details).   Patient is a 24 year old female with history of migraines presents emergency department with sore  throat, ear pain and headache.  Patient is [redacted] weeks pregnant.  See HPI  Physical exam patient's vitals are stable.  Blood pressure is a little low at 95/59.  TMs are dull and pink/red bilaterally.  Throat is red and inflamed.  Remainder of exam is unremarkable  Strep test, Covid test  Patient was given 1 g of Tylenol p.o.  Strep test is negative.  Covid test negative  I did explain the findings to the patient.  She was given a prescription for amoxicillin for the ear infection.  She is to follow-up with her regular doctor if not improving in 3 days.  Return emergency department worsening.  She is discharged stable condition.  Also given a work note as requested.      As part of my medical decision making, I reviewed the following data within the electronic MEDICAL RECORD NUMBER Nursing notes reviewed and incorporated, Labs reviewed , Old chart reviewed, Notes from prior  ED visits and [place] Controlled Substance Database  ____________________________________________   FINAL CLINICAL IMPRESSION(S) / ED DIAGNOSES  Final diagnoses:  Acute serous otitis media, recurrence not specified, unspecified laterality  Acute pharyngitis, unspecified etiology      NEW MEDICATIONS STARTED DURING THIS VISIT:  Current Discharge Medication List    START taking these medications   Details  amoxicillin (AMOXIL) 875 MG tablet Take 1 tablet (875 mg total) by mouth 2 (two) times daily. Qty: 20 tablet, Refills: 0         Note:  This document was prepared using Dragon voice recognition software and may include unintentional dictation errors.    Faythe Ghee, PA-C 10/28/19 1909    Sharman Cheek, MD 10/28/19 2308

## 2019-10-29 ENCOUNTER — Other Ambulatory Visit: Payer: Self-pay | Admitting: Obstetrics

## 2019-10-29 DIAGNOSIS — Z3480 Encounter for supervision of other normal pregnancy, unspecified trimester: Secondary | ICD-10-CM

## 2019-10-29 DIAGNOSIS — Z3A10 10 weeks gestation of pregnancy: Secondary | ICD-10-CM

## 2019-10-30 ENCOUNTER — Encounter: Payer: Self-pay | Admitting: Advanced Practice Midwife

## 2019-10-30 ENCOUNTER — Other Ambulatory Visit: Payer: Self-pay

## 2019-10-30 ENCOUNTER — Ambulatory Visit (INDEPENDENT_AMBULATORY_CARE_PROVIDER_SITE_OTHER): Payer: Medicaid Other

## 2019-10-30 ENCOUNTER — Ambulatory Visit (INDEPENDENT_AMBULATORY_CARE_PROVIDER_SITE_OTHER): Payer: Medicaid Other | Admitting: Advanced Practice Midwife

## 2019-10-30 VITALS — BP 110/70 | Wt 124.0 lb

## 2019-10-30 DIAGNOSIS — Z3A10 10 weeks gestation of pregnancy: Secondary | ICD-10-CM

## 2019-10-30 DIAGNOSIS — Z3481 Encounter for supervision of other normal pregnancy, first trimester: Secondary | ICD-10-CM

## 2019-10-30 DIAGNOSIS — Z3480 Encounter for supervision of other normal pregnancy, unspecified trimester: Secondary | ICD-10-CM

## 2019-10-30 LAB — POCT URINALYSIS DIPSTICK OB
Glucose, UA: NEGATIVE
POC,PROTEIN,UA: NEGATIVE

## 2019-10-30 NOTE — Progress Notes (Addendum)
  Routine Prenatal Care Visit  Subjective  Suzanne Nelson is a 24 y.o. G3P0020 at [redacted]w[redacted]d being seen today for ongoing prenatal care.  She is currently monitored for the following issues for this low-risk pregnancy and has Major depressive disorder, single episode, moderate (HCC); Attention deficit hyperactivity disorder, combined type; Oppositional defiant disorder; Hydronephrosis; Calculus of distal left ureter; Left ureteral stone; Supervision of other normal pregnancy, antepartum; and BV (bacterial vaginosis) on their problem list.  ----------------------------------------------------------------------------------- Patient reports headache every other day with worst one occuring this past Sunday. She also has chronic low back pain with some worsening since pregnancy.    . Vag. Bleeding: None.   . Leaking Fluid denies.  ----------------------------------------------------------------------------------- The following portions of the patient's history were reviewed and updated as appropriate: allergies, current medications, past family history, past medical history, past social history, past surgical history and problem list. Problem list updated.  Objective  Blood pressure 110/70, weight 124 lb (56.2 kg), last menstrual period 07/10/2019. Pregravid weight 130 lb (59 kg) Total Weight Gain -6 lb (-2.722 kg) Urinalysis: Urine Protein Negative  Urine Glucose Negative  Fetal Status: Fetal Heart Rate (bpm): 175          Viability scan today: EDD by 6w u/s on 6/11 at [facility], ultrasound today agrees within 4 days.  General:  Alert, oriented and cooperative. Patient is in no acute distress.  Skin: Skin is warm and dry. No rash noted.   Cardiovascular: Normal heart rate noted  Respiratory: Normal respiratory effort, no problems with respiration noted  Abdomen: Soft, gravid, appropriate for gestational age. Pain/Pressure: Present     Pelvic:  Cervical exam deferred        Extremities: Normal range of  motion.     Mental Status: Normal mood and affect. Normal behavior. Normal judgment and thought content.   Assessment   24 y.o. G3P0020 at [redacted]w[redacted]d by  05/23/2020, Date entered prior to episode creation presenting for routine prenatal visit  Plan  Headache: increase hydration, magnesium supplement, consider fioricet rx after 1st trimester Back pain: heat/ice, stretches/exercise, epsom salt soaks, abdominal support band, consider chiropractic/acupuncture/massage/PT  Preterm labor symptoms and general obstetric precautions including but not limited to vaginal bleeding, contractions, leaking of fluid and fetal movement were reviewed in detail with the patient. Please refer to After Visit Summary for other counseling recommendations.   Return in about 4 weeks (around 11/27/2019) for rob.  Tresea Mall, CNM 10/30/2019 11:46 AM

## 2019-10-30 NOTE — Patient Instructions (Signed)

## 2019-11-04 ENCOUNTER — Emergency Department
Admission: EM | Admit: 2019-11-04 | Discharge: 2019-11-04 | Disposition: A | Discharge status: Home / Self Care | Payer: Medicaid Other | Attending: Emergency Medicine | Admitting: Emergency Medicine

## 2019-11-04 ENCOUNTER — Encounter: Payer: Self-pay | Admitting: Emergency Medicine

## 2019-11-04 ENCOUNTER — Other Ambulatory Visit: Payer: Self-pay

## 2019-11-04 DIAGNOSIS — Z79899 Other long term (current) drug therapy: Secondary | ICD-10-CM | POA: Insufficient documentation

## 2019-11-04 DIAGNOSIS — R1110 Vomiting, unspecified: Secondary | ICD-10-CM

## 2019-11-04 DIAGNOSIS — F17210 Nicotine dependence, cigarettes, uncomplicated: Secondary | ICD-10-CM | POA: Diagnosis not present

## 2019-11-04 DIAGNOSIS — Z3A11 11 weeks gestation of pregnancy: Secondary | ICD-10-CM | POA: Insufficient documentation

## 2019-11-04 DIAGNOSIS — J45909 Unspecified asthma, uncomplicated: Secondary | ICD-10-CM | POA: Insufficient documentation

## 2019-11-04 DIAGNOSIS — O219 Vomiting of pregnancy, unspecified: Secondary | ICD-10-CM | POA: Diagnosis not present

## 2019-11-04 LAB — URINALYSIS, COMPLETE (UACMP) WITH MICROSCOPIC
Bacteria, UA: NONE SEEN
Bilirubin Urine: NEGATIVE
Glucose, UA: NEGATIVE mg/dL
Hgb urine dipstick: NEGATIVE
Ketones, ur: 80 mg/dL — AB
Leukocytes,Ua: NEGATIVE
Nitrite: NEGATIVE
Protein, ur: NEGATIVE mg/dL
Specific Gravity, Urine: 1.023 (ref 1.005–1.030)
pH: 5 (ref 5.0–8.0)

## 2019-11-04 LAB — COMPREHENSIVE METABOLIC PANEL
ALT: 14 U/L (ref 0–44)
AST: 18 U/L (ref 15–41)
Albumin: 3.9 g/dL (ref 3.5–5.0)
Alkaline Phosphatase: 45 U/L (ref 38–126)
Anion gap: 12 (ref 5–15)
BUN: 10 mg/dL (ref 6–20)
CO2: 19 mmol/L — ABNORMAL LOW (ref 22–32)
Calcium: 9 mg/dL (ref 8.9–10.3)
Chloride: 100 mmol/L (ref 98–111)
Creatinine, Ser: 0.47 mg/dL (ref 0.44–1.00)
GFR calc Af Amer: >60 mL/min (ref >60)
GFR calc non Af Amer: >60 mL/min (ref >60)
Glucose, Bld: 138 mg/dL — ABNORMAL HIGH (ref 70–99)
Potassium: 3.4 mmol/L — ABNORMAL LOW (ref 3.5–5.1)
Sodium: 131 mmol/L — ABNORMAL LOW (ref 135–145)
Total Bilirubin: 1.2 mg/dL (ref 0.3–1.2)
Total Protein: 6.8 g/dL (ref 6.5–8.1)

## 2019-11-04 LAB — MATERNIT 21 PLUS CORE, BLOOD
Fetal Fraction: 11
Result (T21): NEGATIVE
Trisomy 13 (Patau syndrome): NEGATIVE
Trisomy 18 (Edwards syndrome): NEGATIVE
Trisomy 21 (Down syndrome): NEGATIVE

## 2019-11-04 LAB — CBC
HCT: 38.2 % (ref 36.0–46.0)
Hemoglobin: 13.3 g/dL (ref 12.0–15.0)
MCH: 31.1 pg (ref 26.0–34.0)
MCHC: 34.8 g/dL (ref 30.0–36.0)
MCV: 89.3 fL (ref 80.0–100.0)
Platelets: 296 10*3/uL (ref 150–400)
RBC: 4.28 MIL/uL (ref 3.87–5.11)
RDW: 11.7 % (ref 11.5–15.5)
WBC: 12.1 10*3/uL — ABNORMAL HIGH (ref 4.0–10.5)
nRBC: 0 % (ref 0.0–0.2)

## 2019-11-04 LAB — LIPASE, BLOOD: Lipase: 22 U/L (ref 11–51)

## 2019-11-04 MED ORDER — ONDANSETRON HCL 4 MG/2ML IJ SOLN
4.0000 mg | Freq: Once | INTRAMUSCULAR | Status: AC
  Administered 2019-11-04: 4 mg via INTRAVENOUS
  Filled 2019-11-04: qty 2

## 2019-11-04 MED ORDER — SODIUM CHLORIDE 0.9 % IV BOLUS
1000.0000 mL | Freq: Once | INTRAVENOUS | Status: AC
  Administered 2019-11-04: 1000 mL via INTRAVENOUS

## 2019-11-04 MED ORDER — METOCLOPRAMIDE HCL 10 MG PO TABS
10.0000 mg | ORAL_TABLET | Freq: Three times a day (TID) | ORAL | 1 refills | Status: DC | PRN
Start: 2019-11-04 — End: 2019-11-17

## 2019-11-04 NOTE — ED Notes (Signed)
Pt states nausea has ended, pt given crackers and ginger ale for po challenge.

## 2019-11-04 NOTE — ED Notes (Signed)
Pt with c/o N/V since yesterday, unable to keep po nausea meds down. Pt is [redacted] weeks pregnant.

## 2019-11-04 NOTE — ED Triage Notes (Addendum)
Here for vomiting during pregnancy. Is [redacted] weeks pregnant G1.  Hurst all over from vomiting so much.  Has ODT zofran at home but has not helped much per pt.  No active vomiting at this moment.  On abx for UTI/OM but has been unable to finish the meds r/t vomiting.

## 2019-11-04 NOTE — ED Provider Notes (Signed)
Emergency Department Provider Note  ____________________________________________  Time seen: Approximately 4:57 PM  I have reviewed the triage vital signs and the nursing notes.   HISTORY  Chief Complaint Emesis   Historian Patient     HPI Suzanne Nelson is a 24 y.o. female G1, P0, presents to the emergency department with nausea and vomiting for the past 2 days.  Patient is currently [redacted] weeks pregnant.  She has had no pelvic pain, vaginal bleeding or gush of vaginal fluids.  She states that she has not been able to keep down any food or fluids due to constant vomiting.  She states that her OB/GYN has prescribed her some antinausea medicine which helped initially but has not improved her current symptoms.  No dysuria, hematuria or increased urinary frequency.   Past Medical History:  Diagnosis Date  . ADHD (attention deficit hyperactivity disorder)   . Anxiety   . Asthma   . Asthma   . Depression   . Migraine   . No pertinent past medical history   . Panic attack      Immunizations up to date:  Yes.     Past Medical History:  Diagnosis Date  . ADHD (attention deficit hyperactivity disorder)   . Anxiety   . Asthma   . Asthma   . Depression   . Migraine   . No pertinent past medical history   . Panic attack     Patient Active Problem List   Diagnosis Date Noted  . BV (bacterial vaginosis) 10/19/2019  . Supervision of other normal pregnancy, antepartum 10/17/2019  . Hydronephrosis 05/18/2015  . Calculus of distal left ureter 05/18/2015  . Left ureteral stone 05/18/2015  . Major depressive disorder, single episode, moderate (HCC) 03/16/2011  . 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 Oppositional defiant disorder 03/16/2011    Past Surgical History:  Procedure Laterality Date  . CYSTO N/A 05/19/2015   Procedure: CYSTO;  Surgeon: Vanna Scotland, MD;  Location: [facility] ORS;  Service: Urology;  Laterality: N/A;  . NO PAST  SURGERIES    . STENT PLACE LEFT URETER ([facility] HX)    . URETEROSCOPY WITH HOLMIUM LASER LITHOTRIPSY Left 05/19/2015   Procedure: URETEROSCOPY WITH HOLMIUM LASER LITHOTRIPSY;  Surgeon: Vanna Scotland, MD;  Location: [facility] ORS;  Service: Urology;  Laterality: Left;    Prior to Admission medications   Medication Sig Start Date End Date Taking? Authorizing Provider  amoxicillin (AMOXIL) 875 MG tablet Take 1 tablet (875 mg total) by mouth 2 (two) times daily. 10/28/19   Fisher, Roselyn Bering, PA-C  metoCLOPramide (REGLAN) 10 MG tablet Take 1 tablet (10 mg total) by mouth every 8 (eight) hours as needed for up to 5 days for nausea. 11/04/19 11/09/19  Orvil Feil, PA-C  ondansetron (ZOFRAN ODT) 4 MG disintegrating tablet Take 1 tablet (4 mg total) by mouth every 6 (six) hours as needed for nausea. 10/19/19   Mirna Mires, CNM  phenazopyridine (PYRIDIUM) 200 MG tablet Take 1 tablet (200 mg total) by mouth 3 (three) times daily as needed for pain (urethral spasm). 10/19/19   Mirna Mires, CNM  albuterol (PROVENTIL HFA;VENTOLIN HFA) 108 (90 Base) MCG/ACT inhaler Inhale 2 puffs into the lungs every 6 (six) hours as needed for wheezing or shortness of breath. 05/22/18 10/28/19  Enid Derry, PA-C  escitalopram (LEXAPRO) 20 MG tablet  09/04/19 10/28/19  [provider]    Allergies Onion, Adhesive [tape], and Tape  Family History  Problem Relation  Age of Onset  . Diabetes Mellitus II Mother   . Bipolar disorder Mother   . Breast cancer Paternal Grandmother        not sure of age    Social History Social History   Tobacco Use  . Smoking status: Current Every Day Smoker    Packs/day: 1.00    Types: Cigarettes  . Smokeless tobacco: Never Used  . Tobacco comment: quiting now  Vaping Use  . Vaping Use: Former  Substance Use Topics  . Alcohol use: Not Currently    Comment: socially  . Drug use: Yes    Frequency: 1.0 times per week    Types: Marijuana    Comment: Last use one month age      Review of Systems  Constitutional: No fever/chills Eyes:  No discharge ENT: No upper respiratory complaints. Respiratory: no cough. No SOB/ use of accessory muscles to breath Gastrointestinal: Patient has emesis.  Musculoskeletal: Negative for musculoskeletal pain. Skin: Negative for rash, abrasions, lacerations, ecchymosis.   ____________________________________________   PHYSICAL EXAM:  VITAL SIGNS: ED Triage Vitals  Enc Vitals Group     BP 11/04/19 1336 99/65     Pulse Rate 11/04/19 1336 (!) 107     Resp 11/04/19 1336 17     Temp 11/04/19 1336 98.8 F (37.1 C)     Temp Source 11/04/19 1336 Oral     SpO2 11/04/19 1336 96 %     Weight 11/04/19 1337 124 lb (56.2 kg)     Height 11/04/19 1337 5\' 2"  (1.575 m)     Head Circumference --      Peak Flow --      Pain Score 11/04/19 1340 0     Pain Loc --      Pain Edu? --      Excl. in GC? --      Constitutional: Alert and oriented. Well appearing and in no acute distress. Eyes: Conjunctivae are normal. PERRL. EOMI. Head: Atraumatic. ENT:      Nose: No congestion/rhinnorhea.      Mouth/Throat: Mucous membranes are moist.  Neck: No stridor. FROM.  Cardiovascular: Normal rate, regular rhythm. Normal S1 and S2.  Good peripheral circulation. Respiratory: Normal respiratory effort without tachypnea or retractions. Lungs CTAB. Good air entry to the bases with no decreased or absent breath sounds Gastrointestinal: Bowel sounds x 4 quadrants. Soft and nontender to palpation. No guarding or rigidity. No distention. Musculoskeletal: Full range of motion to all extremities. No obvious deformities noted Neurologic:  Normal for age. No gross focal neurologic deficits are appreciated.  Skin:  Skin is warm, dry and intact. No rash noted. Psychiatric: Mood and affect are normal for age. Speech and behavior are normal.   ____________________________________________   LABS (all labs ordered are listed, but only abnormal results  are displayed)  Labs Reviewed  COMPREHENSIVE METABOLIC PANEL - Abnormal; Notable for the following components:      Result Value   Sodium 131 (*)    Potassium 3.4 (*)    CO2 19 (*)    Glucose, Bld 138 (*)    All other components within normal limits  CBC - Abnormal; Notable for the following components:   WBC 12.1 (*)    All other components within normal limits  URINALYSIS, COMPLETE (UACMP) WITH MICROSCOPIC - Abnormal; Notable for the following components:   Color, Urine YELLOW (*)    APPearance CLEAR (*)    Ketones, ur 80 (*)    All other components  within normal limits  LIPASE, BLOOD   ____________________________________________  EKG   ____________________________________________  RADIOLOGY Geraldo Pitter, personally viewed and evaluated these images (plain radiographs) as part of my medical decision making, as well as reviewing the written report by the radiologist.    No results found.  ____________________________________________    PROCEDURES  Procedure(s) performed:     Procedures     Medications  sodium chloride 0.9 % bolus 1,000 mL (0 mLs Intravenous Stopped 11/04/19 1743)  ondansetron (ZOFRAN) injection 4 mg (4 mg Intravenous Given 11/04/19 1659)     ____________________________________________   INITIAL IMPRESSION / ASSESSMENT AND PLAN / ED COURSE  Pertinent labs & imaging results that were available during my care of the patient were reviewed by me and considered in my medical decision making (see chart for details).  Clinical Course as of Nov 03 1833  Wynelle Link Nov 04, 2019  1737 Bacteria, UA: NONE SEEN [JW]    Clinical Course User Index [JW] Orvil Feil, PA-C     Assessment and Plan:  Hyperemesis 24 year old female presents to the emergency department with nausea and emesis for at least 24 hours.  Vital signs were reassuring in triage.  Physical exam, patient was alert and oriented.  She declined complaints of abdominal pain or  pelvic pain.  She had no CVA tenderness on exam.  Basic labs were obtained and were reassuring.  Urinalysis revealed no signs of cystitis.  Patient received supplemental fluids and Zofran and stated that she felt much better.  I discharge patient with a short course of Reglan and advised her to follow-up with her OB/GYN.  Return precautions were given to return with new or worsening symptoms.    ____________________________________________  FINAL CLINICAL IMPRESSION(S) / ED DIAGNOSES  Final diagnoses:  Hyperemesis      NEW MEDICATIONS STARTED DURING THIS VISIT:  ED Discharge Orders         Ordered    metoCLOPramide (REGLAN) 10 MG tablet  Every 8 hours PRN     Discontinue  Reprint     11/04/19 1805              This chart was dictated using voice recognition software/Dragon. Despite best efforts to proofread, errors can occur which can change the meaning. Any change was purely unintentional.     Orvil Feil, PA-C 11/04/19 1839    Sharman Cheek, MD 11/05/19 [phone]

## 2019-11-04 NOTE — ED Notes (Signed)
Pt states nausea is better. Pt tolerated po crackers and fluid.

## 2019-11-09 ENCOUNTER — Telehealth: Payer: Self-pay

## 2019-11-09 LAB — INHERITEST CORE(CF97,SMA,FRAX)

## 2019-11-09 NOTE — Telephone Encounter (Signed)
[facility] calling to make sure Erskine Squibb has received the Inheritest results

## 2019-11-11 ENCOUNTER — Other Ambulatory Visit: Payer: Self-pay | Admitting: Advanced Practice Midwife

## 2019-11-11 DIAGNOSIS — Z141 Cystic fibrosis carrier: Secondary | ICD-10-CM

## 2019-11-11 NOTE — Progress Notes (Signed)
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

## 2019-11-14 ENCOUNTER — Ambulatory Visit (INDEPENDENT_AMBULATORY_CARE_PROVIDER_SITE_OTHER): Payer: Medicaid Other | Admitting: Advanced Practice Midwife

## 2019-11-14 ENCOUNTER — Encounter: Payer: Self-pay | Admitting: Advanced Practice Midwife

## 2019-11-14 ENCOUNTER — Other Ambulatory Visit: Payer: Self-pay

## 2019-11-14 VITALS — BP 104/69 | HR 84 | Wt 123.0 lb

## 2019-11-14 DIAGNOSIS — Z3A12 12 weeks gestation of pregnancy: Secondary | ICD-10-CM

## 2019-11-14 DIAGNOSIS — Z3480 Encounter for supervision of other normal pregnancy, unspecified trimester: Secondary | ICD-10-CM

## 2019-11-14 DIAGNOSIS — O219 Vomiting of pregnancy, unspecified: Secondary | ICD-10-CM

## 2019-11-14 MED ORDER — PROMETHAZINE HCL 12.5 MG RE SUPP: 12.5000 mg | Freq: Four times a day (QID) | RECTAL | 1 refills | Status: DC | PRN

## 2019-11-14 NOTE — Progress Notes (Signed)
Routine Prenatal Care Visit  Subjective  Suzanne Nelson is a 24 y.o. G3P0020 at [redacted]w[redacted]d being seen today for ongoing prenatal care.  She is currently monitored for the following issues for this low-risk pregnancy and has Major depressive disorder, single episode, moderate (HCC); Attention deficit hyperactivity disorder, combined type; Oppositional defiant disorder; Hydronephrosis; Calculus of distal left ureter; Left ureteral stone; Supervision of other normal pregnancy, antepartum; and BV (bacterial vaginosis) on their problem list.  ----------------------------------------------------------------------------------- Patient reports zofran was working initially and in the past couple days she's having trouble keeping much down. She was doing well staying hydrated, however in the past nearly 24 hours she has not kept anything down. She has been taking an antibiotic that was prescribed several months ago for UTI. Her most recent urine culture was normal and she is advised to discontinue that medicine. We discussed trying phenergan suppositories and making an attempt to keep food/fluids down. If no success by tomorrow will consider IV fluid treatment.     . Vag. Bleeding: None.   . Leaking Fluid denies.  ----------------------------------------------------------------------------------- The following portions of the patient's history were reviewed and updated as appropriate: allergies, current medications, past family history, past medical history, past social history, past surgical history and problem list. Problem list updated.  Objective  Blood pressure 104/69, pulse 84, weight 123 lb (55.8 kg), last menstrual period 07/10/2019. Pregravid weight 130 lb (59 kg) Total Weight Gain -7 lb (-3.175 kg) Urinalysis: Urine Protein    Urine Glucose    Fetal Status: Fetal Heart Rate (bpm): 161         General:  Alert, oriented and cooperative. Patient is in no acute distress.  Skin: Skin is warm and dry. No  rash noted.   Cardiovascular: Normal heart rate noted  Respiratory: Normal respiratory effort, no problems with respiration noted  Abdomen: Soft, gravid, appropriate for gestational age.       Pelvic:  Cervical exam deferred        Extremities: Normal range of motion.     Mental Status: Normal mood and affect. Normal behavior. Normal judgment and thought content.   Assessment   24 y.o. G3P0020 at [redacted]w[redacted]d by  05/23/2020, Date entered prior to episode creation presenting for work-in prenatal visit  Plan   THIRD Problems (from 10/17/19 to present)    Problem Noted Resolved   Supervision of other normal pregnancy, antepartum 10/17/2019 by Mirna Mires, CNM No   Overview Addendum 10/31/2019 10:25 AM by Mirna Mires, CNM     Clinic  Prenatal Labs  Dating Completed. EDD  Supports the LMP dating Blood type: --/--/O POS Performed at [facility], [address] [place], [place] [address]  [phone])   Genetic Screen 1 Screen:    AFP:     Quad:     NIPS: Antibody:   Anatomic [place]  Rubella:    GTT Early:               Third trimester:  RPR: NON REACTIVE (06/11 0918)   Flu vaccine  HBsAg:     TDaP vaccine                                               Rhogam: HIV: Non Reactive (06/11 0918)   Baby Food  GBS: (For PCN allergy, check sensitivities)  Contraception  Pap:  Circumcision    Pediatrician    Support Person             Previous Version    N/V: phenergan suppository Rx, small sips throughout the day, small frequent PO intake, notify WS for worsening symptoms   Preterm labor symptoms and general obstetric precautions including but not limited to vaginal bleeding, contractions, leaking of fluid and fetal movement were reviewed in detail with the patient.   Return for scheduled prenatal appointment.  Tresea Mall, CNM 11/14/2019 9:49 AM

## 2019-11-14 NOTE — Progress Notes (Signed)
ROB N&V/threw up abx for UTI, having left side pain radiating to back

## 2019-11-16 ENCOUNTER — Encounter: Payer: Self-pay | Admitting: Emergency Medicine

## 2019-11-16 ENCOUNTER — Other Ambulatory Visit: Payer: Self-pay

## 2019-11-16 ENCOUNTER — Telehealth: Payer: Self-pay

## 2019-11-16 ENCOUNTER — Emergency Department
Admission: EM | Admit: 2019-11-16 | Discharge: 2019-11-17 | Disposition: A | Discharge status: Home / Self Care | Payer: Medicaid Other | Attending: Emergency Medicine | Admitting: Emergency Medicine

## 2019-11-16 DIAGNOSIS — Z3A13 13 weeks gestation of pregnancy: Secondary | ICD-10-CM | POA: Diagnosis not present

## 2019-11-16 DIAGNOSIS — F909 Attention-deficit hyperactivity disorder, unspecified type: Secondary | ICD-10-CM | POA: Insufficient documentation

## 2019-11-16 DIAGNOSIS — F17210 Nicotine dependence, cigarettes, uncomplicated: Secondary | ICD-10-CM | POA: Diagnosis not present

## 2019-11-16 DIAGNOSIS — O219 Vomiting of pregnancy, unspecified: Secondary | ICD-10-CM | POA: Diagnosis present

## 2019-11-16 DIAGNOSIS — Z79899 Other long term (current) drug therapy: Secondary | ICD-10-CM | POA: Insufficient documentation

## 2019-11-16 DIAGNOSIS — O210 Mild hyperemesis gravidarum: Secondary | ICD-10-CM | POA: Insufficient documentation

## 2019-11-16 DIAGNOSIS — J45909 Unspecified asthma, uncomplicated: Secondary | ICD-10-CM | POA: Diagnosis not present

## 2019-11-16 LAB — CBC WITH DIFFERENTIAL/PLATELET
Abs Immature Granulocytes: 0.05 10*3/uL (ref 0.00–0.07)
Basophils Absolute: 0 10*3/uL (ref 0.0–0.1)
Basophils Relative: 0 %
Eosinophils Absolute: 0.1 10*3/uL (ref 0.0–0.5)
Eosinophils Relative: 1 %
HCT: 35.9 % — ABNORMAL LOW (ref 36.0–46.0)
Hemoglobin: 13 g/dL (ref 12.0–15.0)
Immature Granulocytes: 0 %
Lymphocytes Relative: 17 %
Lymphs Abs: 2 10*3/uL (ref 0.7–4.0)
MCH: 31.4 pg (ref 26.0–34.0)
MCHC: 36.2 g/dL — ABNORMAL HIGH (ref 30.0–36.0)
MCV: 86.7 fL (ref 80.0–100.0)
Monocytes Absolute: 0.6 10*3/uL (ref 0.1–1.0)
Monocytes Relative: 5 %
Neutro Abs: 9.1 10*3/uL — ABNORMAL HIGH (ref 1.7–7.7)
Neutrophils Relative %: 77 %
Platelets: 259 10*3/uL (ref 150–400)
RBC: 4.14 MIL/uL (ref 3.87–5.11)
RDW: 12.2 % (ref 11.5–15.5)
WBC: 11.8 10*3/uL — ABNORMAL HIGH (ref 4.0–10.5)
nRBC: 0 % (ref 0.0–0.2)

## 2019-11-16 LAB — URINALYSIS, COMPLETE (UACMP) WITH MICROSCOPIC
Bacteria, UA: NONE SEEN
Bilirubin Urine: NEGATIVE
Glucose, UA: NEGATIVE mg/dL
Hgb urine dipstick: NEGATIVE
Ketones, ur: NEGATIVE mg/dL
Leukocytes,Ua: NEGATIVE
Nitrite: NEGATIVE
Protein, ur: NEGATIVE mg/dL
Specific Gravity, Urine: 1.019 (ref 1.005–1.030)
pH: 7 (ref 5.0–8.0)

## 2019-11-16 LAB — COMPREHENSIVE METABOLIC PANEL
ALT: 9 U/L (ref 0–44)
AST: 14 U/L — ABNORMAL LOW (ref 15–41)
Albumin: 3.9 g/dL (ref 3.5–5.0)
Alkaline Phosphatase: 47 U/L (ref 38–126)
Anion gap: 7 (ref 5–15)
BUN: 9 mg/dL (ref 6–20)
CO2: 26 mmol/L (ref 22–32)
Calcium: 8.9 mg/dL (ref 8.9–10.3)
Chloride: 103 mmol/L (ref 98–111)
Creatinine, Ser: 0.4 mg/dL — ABNORMAL LOW (ref 0.44–1.00)
GFR calc Af Amer: >60 mL/min (ref >60)
GFR calc non Af Amer: >60 mL/min (ref >60)
Glucose, Bld: 91 mg/dL (ref 70–99)
Potassium: 4 mmol/L (ref 3.5–5.1)
Sodium: 136 mmol/L (ref 135–145)
Total Bilirubin: 0.7 mg/dL (ref 0.3–1.2)
Total Protein: 6.7 g/dL (ref 6.5–8.1)

## 2019-11-16 MED ORDER — METOCLOPRAMIDE HCL 5 MG/ML IJ SOLN
5.0000 mg | Freq: Once | INTRAMUSCULAR | Status: AC
  Administered 2019-11-16: 5 mg via INTRAVENOUS
  Filled 2019-11-16: qty 2

## 2019-11-16 MED ORDER — DEXTROSE-NACL 5-0.45 % IV SOLN: Freq: Once | INTRAVENOUS | Status: AC

## 2019-11-16 NOTE — Telephone Encounter (Signed)
Who prescribed the antibiotics? Who diagnosed the ear infection? I would call them back and tell them what is going on. Is she taking her nausea medication before she takes the antibiotic and is she taking the antibiotic with food? I won't prescribe antibiotics for something I did not diagnose and for a patient I did not see. Suzanne Nelson

## 2019-11-16 NOTE — ED Notes (Signed)
PT states being diagnosed with bilateral ear infections and unable to keep down the antibiotics. Pt states not have a bowel movement in a month. Pt states she is [redacted] weeks pregnant. Pt states nausea and vomiting.

## 2019-11-16 NOTE — ED Triage Notes (Signed)
Pt to ED via POV c/o nausea and vomiting. Pt state that she has not been able to keep anything down for the last 24 hours. Pt states that she has an ear infection and has not been able to keep antibiotics down either. Pt is in NAD. No active vomiting at this time.

## 2019-11-16 NOTE — ED Triage Notes (Signed)
First nurse note- here for ear pain and nausea. No active vomiting. Pt is pregnant.

## 2019-11-16 NOTE — Telephone Encounter (Signed)
Pt mother is calling stating that pt has double ear infection and has been on 2 different antibiotics but they keep making her sick and she throws them up. I advised pt to go to urgent care, but she states they do not take medicaid and she did not want to go there. . Wanting to know if someone can send in antibiotic that "is not so big". Please advise

## 2019-11-16 NOTE — Telephone Encounter (Signed)
Pt going back to ER bc her nause medication is not working either and she cannot keep anything down and feels dehydrated on top of her ear infection.

## 2019-11-17 MED ORDER — METOCLOPRAMIDE HCL 10 MG PO TABS
10.0000 mg | ORAL_TABLET | Freq: Three times a day (TID) | ORAL | 0 refills | Status: DC | PRN
Start: 2019-11-17 — End: 2020-01-10

## 2019-11-17 NOTE — Discharge Instructions (Signed)
1.  You may alternate Reglan and Phenergan suppositories as needed for nausea/vomiting. 2.  Clear liquids x12 hours then bland diet x3 days, then slowly advance diet as tolerated. 3.  Return to the ER for worsening symptoms, persistent vomiting, difficulty breathing or other concerns.

## 2019-11-17 NOTE — ED Notes (Signed)
Pt states she was able to keep down ice chips and crackers

## 2019-11-17 NOTE — ED Notes (Signed)
Pt in room. Fetal heart rate was 136. Provider notified. Pt provided with saltines and ice chips for PO challenge

## 2019-11-17 NOTE — ED Provider Notes (Signed)
[facility] Emergency Department Provider Note   ____________________________________________   First MD Initiated Contact with Patient 11/16/19 2309     (approximate)  I have reviewed the triage vital signs and the nursing notes.   HISTORY  Chief Complaint Otalgia and Nausea    HPI Suzanne Nelson is a 24 y.o. female G3P2 approximately [redacted] weeks pregnant who presents to the ED from home with a chief complaint of nausea and vomiting.  Patient reports hyperemesis throughout this pregnancy.  Recently saw her OB/GYNs 11/14/2019 and switched from Zofran to Phenergan.  Her doctor's note also mention that she was intermittently taking amoxicillin for a UTI diagnosed early in the month.  She was instructed to stop taking it.  Patient mention she was diagnosed with bilateral ear infections at the beginning of the month and has overtime finished that antibiotic prescription as well.  She was taking it intermittently due to not keeping it down from the nausea and vomiting.  Denies fever, chills, cough, chest pain, shortness of breath, abdominal pain, vaginal bleeding/discharge, diarrhea.       Past Medical History:  Diagnosis Date  . ADHD (attention deficit hyperactivity disorder)   . Anxiety   . Asthma   . Asthma   . Depression   . Migraine   . No pertinent past medical history   . Panic attack     Patient Active Problem List   Diagnosis Date Noted  . BV (bacterial vaginosis) 10/19/2019  . Supervision of other normal pregnancy, antepartum 10/17/2019  . Hydronephrosis 05/18/2015  . Calculus of distal left ureter 05/18/2015  . Left ureteral stone 05/18/2015  . Major depressive disorder, single episode, moderate (HCC) 03/16/2011  . 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 Oppositional defiant disorder 03/16/2011    Past Surgical History:  Procedure Laterality Date  . CYSTO N/A 05/19/2015   Procedure: CYSTO;  Surgeon: Vanna Scotland, MD;  Location: [facility] ORS;  Service: Urology;  Laterality: N/A;  . NO PAST SURGERIES    . STENT PLACE LEFT URETER ([facility] HX)    . URETEROSCOPY WITH HOLMIUM LASER LITHOTRIPSY Left 05/19/2015   Procedure: URETEROSCOPY WITH HOLMIUM LASER LITHOTRIPSY;  Surgeon: Vanna Scotland, MD;  Location: [facility] ORS;  Service: Urology;  Laterality: Left;    Prior to Admission medications   Medication Sig Start Date End Date Taking? Authorizing Provider  metoCLOPramide (REGLAN) 10 MG tablet Take 1 tablet (10 mg total) by mouth every 8 (eight) hours as needed for nausea. 11/17/19   Irean Hong, MD  ondansetron (ZOFRAN ODT) 4 MG disintegrating tablet Take 1 tablet (4 mg total) by mouth every 6 (six) hours as needed for nausea. 10/19/19   Mirna Mires, CNM  promethazine (PHENERGAN) 12.5 MG suppository Place 1 suppository (12.5 mg total) rectally every 6 (six) hours as needed for nausea or vomiting. 11/14/19   Tresea Mall, CNM  albuterol (PROVENTIL HFA;VENTOLIN HFA) 108 (90 Base) MCG/ACT inhaler Inhale 2 puffs into the lungs every 6 (six) hours as needed for wheezing or shortness of breath. 05/22/18 10/28/19  Enid Derry, PA-C  escitalopram (LEXAPRO) 20 MG tablet  09/04/19 10/28/19  [provider]    Allergies Onion, Adhesive [tape], and Tape  Family History  Problem Relation Age of Onset  . Diabetes Mellitus II Mother   . Bipolar disorder Mother   . Breast cancer Paternal Grandmother        not sure of age    Social History Social History  Tobacco Use  . Smoking status: Current Every Day Smoker    Packs/day: 1.00    Types: Cigarettes  . Smokeless tobacco: Never Used  . Tobacco comment: quiting now  Vaping Use  . Vaping Use: Former  Substance Use Topics  . Alcohol use: Not Currently    Comment: socially  . Drug use: Yes    Frequency: 1.0 times per week    Types: Marijuana    Comment: Last use one month age    Review of Systems  Constitutional: No fever/chills Eyes: No  visual changes. ENT: No sore throat. Cardiovascular: Denies chest pain. Respiratory: Denies shortness of breath. Gastrointestinal: No abdominal pain.  Positive for nausea and vomiting.  No diarrhea.  No constipation. Genitourinary: Negative for dysuria. Musculoskeletal: Negative for back pain. Skin: Negative for rash. Neurological: Negative for headaches, focal weakness or numbness.   ____________________________________________   PHYSICAL EXAM:  VITAL SIGNS: ED Triage Vitals  Enc Vitals Group     BP 11/16/19 1637 119/82     Pulse Rate 11/16/19 1637 92     Resp 11/16/19 1637 17     Temp 11/16/19 1638 98.4 F (36.9 C)     Temp Source 11/16/19 1637 Oral     SpO2 11/16/19 1637 96 %     Weight 11/16/19 1639 123 lb (55.8 kg)     Height 11/16/19 1639 5\' 2"  (1.575 m)     Head Circumference --      Peak Flow --      Pain Score 11/16/19 1649 6     Pain Loc --      Pain Edu? --      Excl. in GC? --     Constitutional: Alert and oriented. Well appearing and in no acute distress. Eyes: Conjunctivae are normal. PERRL. EOMI. Head: Atraumatic. Ears: Slight fluid behind right TM; otherwise unremarkable. Nose: No congestion/rhinnorhea. Mouth/Throat: Mucous membranes are moist.   Neck: No stridor.   Cardiovascular: Normal rate, regular rhythm. Grossly normal heart sounds.  Good peripheral circulation. Respiratory: Normal respiratory effort.  No retractions. Lungs CTAB. Gastrointestinal: Soft and nontender to light or deep palpation. No distention. No abdominal bruits. No CVA tenderness. Musculoskeletal: No lower extremity tenderness nor edema.  No joint effusions. Neurologic:  Normal speech and language. No gross focal neurologic deficits are appreciated. No gait instability. Skin:  Skin is warm, dry and intact. No rash noted. Psychiatric: Mood and affect are normal. Speech and behavior are normal.  ____________________________________________   LABS (all labs ordered are listed,  but only abnormal results are displayed)  Labs Reviewed  CBC WITH DIFFERENTIAL/PLATELET - Abnormal; Notable for the following components:      Result Value   WBC 11.8 (*)    HCT 35.9 (*)    MCHC 36.2 (*)    Neutro Abs 9.1 (*)    All other components within normal limits  COMPREHENSIVE METABOLIC PANEL - Abnormal; Notable for the following components:   Creatinine, Ser 0.40 (*)    AST 14 (*)    All other components within normal limits  URINALYSIS, COMPLETE (UACMP) WITH MICROSCOPIC - Abnormal; Notable for the following components:   Color, Urine YELLOW (*)    APPearance TURBID (*)    All other components within normal limits   ____________________________________________  EKG  None ____________________________________________  RADIOLOGY  ED MD interpretation: None  Official radiology report(s): No results found.  ____________________________________________   PROCEDURES  Procedure(s) performed (including Critical Care):  Procedures   ____________________________________________  INITIAL IMPRESSION / ASSESSMENT AND PLAN / ED COURSE  As part of my medical decision making, I reviewed the following data within the electronic MEDICAL RECORD NUMBER History obtained from family, Nursing notes reviewed and incorporated, Labs reviewed, Old chart reviewed and Notes from prior ED visits     Suzanne Nelson was evaluated in Emergency Department on 11/17/2019 for the symptoms described in the history of present illness. She was evaluated in the context of the global COVID-19 pandemic, which necessitated consideration that the patient might be at risk for infection with the SARS-CoV-2 virus that causes COVID-19. Institutional protocols and algorithms that pertain to the evaluation of patients at risk for COVID-19 are in a state of rapid change based on information released by regulatory bodies including the CDC and federal and state organizations. These policies and algorithms were  followed during the patient's care in the ED.    24 year old female approximately [redacted] weeks pregnant presenting with nausea and vomiting.  Differential diagnosis includes but is not limited to hyperemesis gravidarum, infectious, metabolic etiologies, etc.  Laboratory and urinalysis results unremarkable.  No ketones.  Will initiate IV fluid resuscitation, IV antiemetic and reassess.  Clinical Course as of Nov 16 645  Sat Nov 17, 2019  0017 Patient feeling much better.  Tolerating ice chips without emesis.  Will discharge home with prescription for Reglan to use as needed.  Strict return precautions given.  Patient and family member verbalized understanding and agree with plan of care.   [JS]    Clinical Course User Index [JS] Irean Hong, MD     ____________________________________________   FINAL CLINICAL IMPRESSION(S) / ED DIAGNOSES  Final diagnoses:  [redacted] weeks gestation of pregnancy  Hyperemesis gravidarum     ED Discharge Orders         Ordered    metoCLOPramide (REGLAN) 10 MG tablet  Every 8 hours PRN     Discontinue  Reprint     11/17/19 0124           Note:  This document was prepared using Dragon voice recognition software and may include unintentional dictation errors.   Irean Hong, MD 11/17/19 [phone]

## 2019-11-17 NOTE — ED Notes (Signed)
Pt states she is feeling better and that her nausea has improved

## 2019-11-19 ENCOUNTER — Other Ambulatory Visit: Payer: Self-pay

## 2019-11-19 ENCOUNTER — Ambulatory Visit: Payer: Medicaid Other | Attending: Obstetrics and Gynecology

## 2019-11-19 DIAGNOSIS — O285 Abnormal chromosomal and genetic finding on antenatal screening of mother: Secondary | ICD-10-CM

## 2019-11-19 DIAGNOSIS — O09891 Supervision of other high risk pregnancies, first trimester: Secondary | ICD-10-CM

## 2019-11-19 DIAGNOSIS — Z141 Cystic fibrosis carrier: Secondary | ICD-10-CM

## 2019-11-19 DIAGNOSIS — Z3A13 13 weeks gestation of pregnancy: Secondary | ICD-10-CM | POA: Diagnosis not present

## 2019-11-19 NOTE — Progress Notes (Addendum)
Virtual Visit via Telephone Note  I connected with Suzanne Nelson on 11/19/19 at  9:00 AM EDT by telephone and verified that I am speaking with the correct person using two identifiers.  Referring provider: [facility]  Length of consultation: 45 minutes  Suzanne Nelson was referred to [facility] for genetic counseling to review the results of recent carrier screening as well as testing options for this pregnancy.  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 CF is a condition characterized by the buildup of thick, sticky mucus that can damage the body's organs. Mucus lubricates and protects the linings of the airways, digestive system, reproductive system, and other organs and tissues. Individuals with CF have abnormally sticky mucus that can clog the airways and digestive system, leading to progressive damage to the respiratory system and chronic digestive system problems. The most common features of CF include respiratory difficulties, bacterial infections in the lungs, the formation of scar tissue (fibrosis) and cysts in the lungs, pancreatic insufficiency, CF-related diabetes mellitus, diarrhea, malnutrition, poor growth, and weight loss. Most men with CF have congenital bilateral absence of the vas deferens (CBAVD) which causes female infertility. With therapies, such as daily respiratory therapies and medications to aid digestion, the median lifespan for people with CF is now in their 40's. Treatment may involve lung transplantation and CFTR protein modulators in some cases. CF is variably expressed, meaning features of the condition and their severity vary among affected individuals. Expression and severity of CF depends upon the specific mutations present in an affected individual.   CF is caused by mutations in the CFTR gene. This gene provides instructions for a channel that transports chloride ions  into and out of cells. The flow of chloride ions helps control the movement of water in the body's tissues, which is necessary for the production of thin, freely flowing mucus. Pathogenic variants in the CFTR gene disrupt the function of the chloride channels, preventing them from regulating the flow of chloride ions and water across cell membranes. 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  CF is inherited in an autosomal recessive fashion. This means that the current fetus is only at risk for CF if Suzanne Nelson's partner is also a carrier for the condition. The father of the pregnancy is not currently involved with the patient and is not available for testing at this time. He is reported to be of Caucasian ancestry.  Based on the carrier frequency for CF in the Caucasian population, Suzanne Nelson partner has a 1 in 25 chance of being a carrier for CF. Thus, the couple currently has a 1 in 100 (1%) chance of having a child with CF. If Suzanne Nelson partner were to be identified to be a carrier, the risk for CF in the pregnancy would be 1 in 4 (25%).   If both members of a couple are known to be carriers, then diagnostic testing is available during pregnancy to determine if the fetus is affected.  This can be performed via [facility] at 10-[redacted] weeks gestation or amniocentesis after [redacted] weeks gestation.  We discussed the risks, benefits and limitations of these testing options. If diagnostic testing during pregnancy is not desired, CF testing can be ordered on cord blood at the time of delivery and is included in newborn screening in [place].  The newborn screening testing utilizes  trypsinogen levels rather than genetic testing to detect affected individuals whereas genetic testing can determine the specific variants, if any, that a child may have inherited.  Suzanne Nelson carrier screening was negative for SMA and Fragile X, and the cell free fetal DNA  testing for chromosome conditions was also negative. Thus, her risk to be a carrier for these additional conditions (listed separately in the laboratory report) has been reduced but not eliminated. This also significantly reduces her risk of having a child affected by one of these conditions.   We also obtained a detailed family history and pregnancy history. This is the first pregnancy for Suzanne Nelson and this partner.  She reported two prior pregnancies that resulted in very early miscarriage prior to establishing prenatal care.  In the current pregnancy, she did report smoking tobacco. Prior to pregnancy she was smoking 2 packs per day, but has cut back to 3 cigarettes per day and continues to try to stop.  She also was smoking marijuana occasionally in the first trimester. Smoking both tobacco and marijuana during pregnancy has been associated with low birth weight, premature delivery and pregnancy loss.  For this reason, we suggest that she cut back or avoid smoking for the remainder of the pregnancy. Suzanne Nelson also reported alcohol use prior to learning that she was pregnant at [redacted] weeks gestation.  She said that she was drinking multiple times per week until that time.  We explained the all or none period and that there is no safe level of alcohol use in pregnancy.  The fact that she stopped as soon as she learned of the pregnancy is great.  Alcohol consumption during pregnancy has been associated with a number of birth defects including growth delays, small head size, heart defects, eye anomalies and facial differences as well as learning disabilities and behavioral problems.  The risk of these to occur tends to increase with the amount of alcohol consumed, however, malformations have been seen with as little as two drinks per day.  Because there is no safe amount of alcohol consumption during pregnancy, we suggest women completely avoid alcohol while pregnant.  A level 2 ultrasound and fetal echocardiogram  (a detailed ultrasound of the fetal heart after 20 weeks) could be considered to detect growth delays or birth defects associated with alcohol use.  However, it is important to remember that not all birth defects can be identified prenatally. The patient also has a history of anxiety and depression for which she is currently not taking medication. She plans to meet with her provider soon to discuss this.   In the family history, the father of the pregnancy has two healthy sons.  He and both of his sons are reported to have a bifid uvula.  While this variation in development may be present as an isolated condition, it may also represent a mild form of cleft palate.  Suzanne Nelson stated that the father of the baby and his sons do not have any known problems with their palate, hearing, or other birth differences to suggest an underlying genetic syndrome. Bifid uvula would not be expected to be detected on prenatal ultrasound.  We would recommend making the pediatrician aware of this history and being mindful of symptoms suggestive of this or a submucosal cleft after birth such as feeding difficulties, spitting up through the nose and frequent ear infections. There are thought to be inherited factors involved in this condition, therefore, we would expect an increased risk for bifid uvula  in this pregnancy.  The remainder of the family history is unremarkable for birth defects, intellectual disabilities, recurrent pregnancy loss or known genetic conditions.  After consideration, the patient elected to:  Attempt to contact the father of the pregnancy regarding possible testing, though she felt he would be difficult to reach.  We are happy to facilitate his testing if he desires.   She declined diagnostic testing during the pregnancy and will discuss testing at the time of birth with her OB.  Speak with her OB about her anatomy ultrasound.  We are happy to schedule that at [facility] at [redacted]  weeks gestation if desired.  We may be reached at [phone] with any questions or concerns.  Suzanne Anderson, MS, CGC   I provided 45 minutes of non-face-to-face time during this encounter.   Suzanne Nelson  Addendum:  I discussed the limitations of evaluation and management by telemedicine and the availability of in person appointments. The patient expressed understanding and agreed to proceed. Patient location: home Provider location:  [facility]

## 2019-11-20 ENCOUNTER — Telehealth: Payer: Self-pay | Admitting: Advanced Practice Midwife

## 2019-11-20 NOTE — Telephone Encounter (Signed)
Patient was scheduled for 11/28/19 and cancelled appointment  [facility], no future appointments. Called and left voicemail for patient to call back to be scheduled.

## 2019-11-21 NOTE — Telephone Encounter (Signed)
Called and left voicemail for patient to call back to be scheduled. 

## 2019-11-28 ENCOUNTER — Encounter: Payer: Self-pay | Admitting: Advanced Practice Midwife

## 2019-12-04 ENCOUNTER — Ambulatory Visit (INDEPENDENT_AMBULATORY_CARE_PROVIDER_SITE_OTHER): Payer: Medicaid Other | Admitting: Obstetrics and Gynecology

## 2019-12-04 ENCOUNTER — Encounter: Payer: Medicaid Other | Admitting: Obstetrics

## 2019-12-04 ENCOUNTER — Other Ambulatory Visit: Payer: Self-pay

## 2019-12-04 ENCOUNTER — Encounter: Payer: Self-pay | Admitting: Obstetrics and Gynecology

## 2019-12-04 VITALS — BP 116/70 | Ht 62.0 in | Wt 130.0 lb

## 2019-12-04 DIAGNOSIS — O210 Mild hyperemesis gravidarum: Secondary | ICD-10-CM

## 2019-12-04 DIAGNOSIS — Z3A15 15 weeks gestation of pregnancy: Secondary | ICD-10-CM

## 2019-12-04 DIAGNOSIS — Z3480 Encounter for supervision of other normal pregnancy, unspecified trimester: Secondary | ICD-10-CM

## 2019-12-04 DIAGNOSIS — O219 Vomiting of pregnancy, unspecified: Secondary | ICD-10-CM

## 2019-12-04 LAB — POCT URINALYSIS DIPSTICK OB
Glucose, UA: NEGATIVE
POC,PROTEIN,UA: NEGATIVE

## 2019-12-04 MED ORDER — ONDANSETRON HCL 4 MG/5ML PO SOLN: 4.0000 mg | Freq: Four times a day (QID) | ORAL | 11 refills | Status: AC

## 2019-12-04 MED ORDER — SCOPOLAMINE 1 MG/3DAYS TD PT72: 1.0000 | MEDICATED_PATCH | TRANSDERMAL | 12 refills | Status: DC

## 2019-12-04 MED ORDER — PROMETHAZINE HCL 6.25 MG/5ML PO SYRP: 25.0000 mg | ORAL_SOLUTION | Freq: Four times a day (QID) | ORAL | 11 refills | Status: DC

## 2019-12-04 NOTE — Progress Notes (Signed)
Routine Prenatal Care Visit  Subjective  Suzanne Nelson is a 24 y.o. G3P0020 at [redacted]w[redacted]d being seen today for ongoing prenatal care.  She is currently monitored for the following issues for this low-risk pregnancy and has Major depressive disorder, single episode, moderate (HCC); Attention deficit hyperactivity disorder, combined type; Oppositional defiant disorder; Hydronephrosis; Calculus of distal left ureter; Left ureteral stone; Supervision of other normal pregnancy, antepartum; and BV (bacterial vaginosis) on their problem list.  ----------------------------------------------------------------------------------- Patient reports nausea and vomiting.   Contractions: Not present. Vag. Bleeding: None.  Movement: Absent. Denies leaking of fluid.  ----------------------------------------------------------------------------------- The following portions of the patient's history were reviewed and updated as appropriate: allergies, current medications, past family history, past medical history, past social history, past surgical history and problem list. Problem list updated.   Objective  Blood pressure 116/70, height 5\' 2"  (1.575 m), weight 130 lb (59 kg), last menstrual period 07/10/2019. Pregravid weight 130 lb (59 kg) Total Weight Gain 0 lb (0 kg) Urinalysis:      Fetal Status: Fetal Heart Rate (bpm): 156   Movement: Absent     General:  Alert, oriented and cooperative. Patient is in no acute distress.  Skin: Skin is warm and dry. No rash noted.   Cardiovascular: Normal heart rate noted  Respiratory: Normal respiratory effort, no problems with respiration noted  Abdomen: Soft, gravid, appropriate for gestational age. Pain/Pressure: Absent     Pelvic:  Cervical exam deferred        Extremities: Normal range of motion.     Mental Status: Normal mood and affect. Normal behavior. Normal judgment and thought content.     Assessment   24 y.o. G3P0020 at [redacted]w[redacted]d by  05/23/2020, Date entered  prior to episode creation presenting for routine prenatal visit  Plan   THIRD Problems (from 10/17/19 to present)    Problem Noted Resolved   Supervision of other normal pregnancy, antepartum 10/17/2019 by 10/19/2019, CNM No   Overview Addendum 10/31/2019 10:25 AM by 11/02/2019, CNM     Clinic  Prenatal Labs  Dating Completed. EDD  Supports the LMP dating Blood type: --/--/O POS Performed at [facility], [address] [place], [place]  [phone])   Genetic Screen 1 Screen:    AFP:     Quad:     NIPS: Antibody:   Anatomic (94/70 9628  Rubella:    GTT Early:               Third trimester:  RPR: NON REACTIVE (06/11 0918)   Flu vaccine  HBsAg:     TDaP vaccine                                               Rhogam: HIV: Non Reactive (06/11 0918)   Baby Food                                               GBS: (For PCN allergy, check sensitivities)  Contraception  Pap:  Circumcision    Pediatrician    Support Person             Previous Version       Patient reports severe nausea vomiting.  She has never been hospitalized for this but has been seen in the ER multiple times.  She reports that she has been taking Phenergan suppositories however these sometimes cause diarrhea which is making her bottom sore.  She does not tolerate tablets well.  The B vitamin tablets are too large for her to swallow.  She has a variety of medications at home but sometimes is not able to tolerate them orally because they are in tablet form.  She would like liquid forms of medications if possible.  Her questionnaire score 34  Reviewed treatment algorithm with the patient and provide the patient a copy of the treatment algorithm.  Reviewed available liquid vitamins and prescription sent for liquid Phenergan and Zofran.  Advise scopolamine patch if Phenergan Zofran Benadryl and event things are not working to control nausea.  Discussed small frequent meals.  Discussed pressure-point  therapy.  Discussed bland diet.  Discussed ginger.  Discussed reasons for hospitalization and advised patient to contact us if she is not able to keep down liquids, food, or is losing more than 5 pounds.  Discussed [email or web address] as a resource for the patient.  Gestational age appropriate obstetric precautions including but not limited to vaginal bleeding, contractions, leaking of fluid and fetal movement were reviewed in detail with the patient.    Return in about 1 week (around 12/11/2019) for rob 1 week and 3 weeks ROB and antomy [place].  Natale Milch MD [facility], [facility] 12/04/2019, 5:59 PM

## 2019-12-04 NOTE — Patient Instructions (Addendum)
Initial steps to help :   B6 (pyridoxine) 25 mg,  3-4 times a day- 200 mg a day total Unisom (doxylamine) 25 mg at bedtime **B6 and Unisom are available as a combination prescription medications called diclegis and bonjesta  B1 (thiamin)  50-100 mg 1-2 a day-  100 mg a day total  Continue prenatal vitamin with iron and thiamin. If it is not tolerated switch to 1 mg of folic acid.  Can add medication for gastric reflux if needed.  Subsequent steps to be added to B1, B6, and Unisom:  1. Antihistamine (one of the following medications) Dramamine      25-50 mg every 4-6 hours Benadryl      25-50 mg every 4-6 hours Meclizine      25 mg every 6 hours  2. Dopamine Antagonist (one of the following medications) Metoclopramide  (Reglan)  5-10 mg every 6-8 hours         PO Promethazine   (Phenergan)   12.5-25 mg every 4-6 hours      PO or rectal Prochlorperazine  (Compazine)  5-10 mg every 6-8 hours     25mg BID rectally   Subsequent steps if there has still not been improvement in symptoms:  3. Daily stool softner:  Colace 100 mg twice a day  4. Ondansetron  (Zofran)   4-8 mg every 6-8 hours    Hyperemesis Gravidarum Hyperemesis gravidarum is a severe form of nausea and vomiting that happens during pregnancy. Hyperemesis is worse than morning sickness. It may cause you to have nausea or vomiting all day for many days. It may keep you from eating and drinking enough food and liquids, which can lead to dehydration, malnutrition, and weight loss. Hyperemesis usually occurs during the first half (the first 20 weeks) of pregnancy. It often goes away once a woman is in her second half of pregnancy. However, sometimes hyperemesis continues through an entire pregnancy. What are the causes? The cause of this condition is not known. It may be related to changes in chemicals (hormones) in the body during pregnancy, such as the high level of pregnancy hormone (human chorionic gonadotropin) or the increase  in the female sex hormone (estrogen). What are the signs or symptoms? Symptoms of this condition include:  Nausea that does not go away.  Vomiting that does not allow you to keep any food down.  Weight loss.  Body fluid loss (dehydration).  Having no desire to eat, or not liking food that you have previously enjoyed. How is this diagnosed? This condition may be diagnosed based on:  A physical exam.  Your medical history.  Your symptoms.  Blood tests.  Urine tests. How is this treated? This condition is managed by controlling symptoms. This may include:  Following an eating plan. This can help lessen nausea and vomiting.  Taking prescription medicines. An eating plan and medicines are often used together to help control symptoms. If medicines do not help relieve nausea and vomiting, you may need to receive fluids through an IV at the hospital. Follow these instructions at home: Eating and drinking   Avoid the following: ? Drinking fluids with meals. Try not to drink anything during the 30 minutes before and after your meals. ? Drinking more than 1 cup of fluid at a time. ? Eating foods that trigger your symptoms. These may include spicy foods, coffee, high-fat foods, very sweet foods, and acidic foods. ? Skipping meals. Nausea can be more intense on an empty stomach. If   If you cannot tolerate food, do not force it. Try sucking on ice chips or other frozen items and make up for missed calories later. ? Lying down within 2 hours after eating. ? Being exposed to environmental triggers. These may include food smells, smoky rooms, closed spaces, rooms with strong smells, warm or humid places, overly loud and noisy rooms, and rooms with motion or flickering lights. Try eating meals in a well-ventilated area that is free of strong smells. ? Quick and sudden changes in your movement. ? Taking iron pills and multivitamins that contain iron. If you take prescription iron pills,  do not stop taking them unless your health care provider approves. ? Preparing food. The smell of food can spoil your appetite or trigger nausea.  To help relieve your symptoms: ? Listen to your body. Everyone is different and has different preferences. Find what works best for you. ? Eat and drink slowly. ? Eat 5-6 small meals daily instead of 3 large meals. Eating small meals and snacks can help you avoid an empty stomach. ? In the morning, before getting out of bed, eat a couple of crackers to avoid moving around on an empty stomach. ? Try eating starchy foods as these are usually tolerated well. Examples include cereal, toast, bread, potatoes, pasta, rice, and pretzels. ? Include at least 1 serving of protein with your meals and snacks. Protein options include lean meats, poultry, seafood, beans, nuts, nut butters, eggs, cheese, and yogurt. ? Try eating a protein-rich snack before bed. Examples of a protein-rick snack include cheese and crackers or a peanut butter sandwich made with 1 slice of whole-wheat bread and 1 tsp (5 g) of peanut butter. ? Eat or suck on things that have ginger in them. It may help relieve nausea. Add  tsp ground ginger to hot tea or choose ginger tea. ? Try drinking 100% fruit juice or an electrolyte drink. An electrolyte drink contains sodium, potassium, and chloride. ? Drink fluids that are cold, clear, and carbonated or sour. Examples include lemonade, ginger ale, lemon-lime soda, ice water, and sparkling water. ? Brush your teeth or use a mouth rinse after meals. ? Talk with your health care provider about starting a supplement of vitamin B6. General instructions  Take over-the-counter and prescription medicines only as told by your health care provider.  Follow instructions from your health care provider about eating or drinking restrictions.  Continue to take your prenatal vitamins as told by your health care provider. If you are having trouble taking your  prenatal vitamins, talk with your health care provider about different options.  Keep all follow-up and pre-birth (prenatal) visits as told by your health care provider. This is important. Contact a health care provider if:  You have pain in your abdomen.  You have a severe headache.  You have vision problems.  You are losing weight.  You feel weak or dizzy. Get help right away if:  You cannot drink fluids without vomiting.  You vomit blood.  You have constant nausea and vomiting.  You are very weak.  You faint.  You have a fever and your symptoms suddenly get worse. Summary  Hyperemesis gravidarum is a severe form of nausea and vomiting that happens during pregnancy.  Making some changes to your eating habits may help relieve nausea and vomiting.  This condition may be managed with medicine.  If medicines do not help relieve nausea and vomiting, you may need to receive fluids through an IV at the  hospital. This information is not intended to replace advice given to you by your health care provider. Make sure you discuss any questions you have with your health care provider. Document Revised: 04/25/2017 Document Reviewed: 12/03/2015 Elsevier Patient Education  [place] of Pregnancy The second trimester is from week 14 through week 27 (months 4 through 6). The second trimester is often a time when you feel your best. Your body has adjusted to being pregnant, and you begin to feel better physically. Usually, morning sickness has lessened or quit completely, you may have more energy, and you may have an increase in appetite. The second trimester is also a time when the fetus is growing rapidly. At the end of the sixth month, the fetus is about 9 inches long and weighs about 1 pounds. You will likely begin to feel the baby move (quickening) between 16 and 20 weeks of pregnancy. Body changes during your second trimester Your body continues to go  through many changes during your second trimester. The changes vary from woman to woman.  Your weight will continue to increase. You will notice your lower abdomen bulging out.  You may begin to get stretch marks on your hips, abdomen, and breasts.  You may develop headaches that can be relieved by medicines. The medicines should be approved by your health care provider.  You may urinate more often because the fetus is pressing on your bladder.  You may develop or continue to have heartburn as a result of your pregnancy.  You may develop constipation because certain hormones are causing the muscles that push waste through your intestines to slow down.  You may develop hemorrhoids or swollen, bulging veins (varicose veins).  You may have back pain. This is caused by: ? Weight gain. ? Pregnancy hormones that are relaxing the joints in your pelvis. ? A shift in weight and the muscles that support your balance.  Your breasts will continue to grow and they will continue to become tender.  Your gums may bleed and may be sensitive to brushing and flossing.  Dark spots or blotches (chloasma, mask of pregnancy) may develop on your face. This will likely fade after the baby is born.  A dark line from your belly button to the pubic area (linea nigra) may appear. This will likely fade after the baby is born.  You may have changes in your hair. These can include thickening of your hair, rapid growth, and changes in texture. Some women also have hair loss during or after pregnancy, or hair that feels dry or thin. Your hair will most likely return to normal after your baby is born. What to expect at prenatal visits During a routine prenatal visit:  You will be weighed to make sure you and the fetus are growing normally.  Your blood pressure will be taken.  Your abdomen will be measured to track your baby's growth.  The fetal heartbeat will be listened to.  Any test results from the previous  visit will be discussed. Your health care provider may ask you:  How you are feeling.  If you are feeling the baby move.  If you have had any abnormal symptoms, such as leaking fluid, bleeding, severe headaches, or abdominal cramping.  If you are using any tobacco products, including cigarettes, chewing tobacco, and electronic cigarettes.  If you have any questions. Other tests that may be performed during your second trimester include:  Blood tests that check for: ?  Low iron levels (anemia). ? High blood sugar that affects pregnant women (gestational diabetes) between 32 and 28 weeks. ? Rh antibodies. This is to check for a protein on red blood cells (Rh factor).  Urine tests to check for infections, diabetes, or protein in the urine.  An ultrasound to confirm the proper growth and development of the baby.  An amniocentesis to check for possible genetic problems.  Fetal screens for spina bifida and Down syndrome.  HIV (human immunodeficiency virus) testing. Routine prenatal testing includes screening for HIV, unless you choose not to have this test. Follow these instructions at home: Medicines  Follow your health care provider's instructions regarding medicine use. Specific medicines may be either safe or unsafe to take during pregnancy.  Take a prenatal vitamin that contains at least 600 micrograms (mcg) of folic acid.  If you develop constipation, try taking a stool softener if your health care provider approves. Eating and drinking   Eat a balanced diet that includes fresh fruits and vegetables, whole grains, good sources of protein such as meat, eggs, or tofu, and low-fat dairy. Your health care provider will help you determine the amount of weight gain that is right for you.  Avoid raw meat and uncooked cheese. These carry germs that can cause birth defects in the baby.  If you have low calcium intake from food, talk to your health care provider about whether you  should take a daily calcium supplement.  Limit foods that are high in fat and processed sugars, such as fried and sweet foods.  To prevent constipation: ? Drink enough fluid to keep your urine clear or pale yellow. ? Eat foods that are high in fiber, such as fresh fruits and vegetables, whole grains, and beans. Activity  Exercise only as directed by your health care provider. Most women can continue their usual exercise routine during pregnancy. Try to exercise for 30 minutes at least 5 days a week. Stop exercising if you experience uterine contractions.  Avoid heavy lifting, wear low heel shoes, and practice good posture.  A sexual relationship may be continued unless your health care provider directs you otherwise. Relieving pain and discomfort  Wear a good support bra to prevent discomfort from breast tenderness.  Take warm sitz baths to soothe any pain or discomfort caused by hemorrhoids. Use hemorrhoid cream if your health care provider approves.  Rest with your legs elevated if you have leg cramps or low back pain.  If you develop varicose veins, wear support hose. Elevate your feet for 15 minutes, 3-4 times a day. Limit salt in your diet. Prenatal Care  Write down your questions. Take them to your prenatal visits.  Keep all your prenatal visits as told by your health care provider. This is important. Safety  Wear your seat belt at all times when driving.  Make a list of emergency phone numbers, including numbers for family, friends, the hospital, and police and fire departments. General instructions  Ask your health care provider for a referral to a local prenatal education class. Begin classes no later than the beginning of month 6 of your pregnancy.  Ask for help if you have counseling or nutritional needs during pregnancy. Your health care provider can offer advice or refer you to specialists for help with various needs.  Do not use hot tubs, steam rooms, or  saunas.  Do not douche or use tampons or scented sanitary pads.  Do not cross your legs for long periods of time.  Avoid cat litter boxes and soil used by cats. These carry germs that can cause birth defects in the baby and possibly loss of the fetus by miscarriage or stillbirth.  Avoid all smoking, herbs, alcohol, and unprescribed drugs. Chemicals in these products can affect the formation and growth of the baby.  Do not use any products that contain nicotine or tobacco, such as cigarettes and e-cigarettes. If you need help quitting, ask your health care provider.  Visit your dentist if you have not gone yet during your pregnancy. Use a soft toothbrush to brush your teeth and be gentle when you floss. Contact a health care provider if:  You have dizziness.  You have mild pelvic cramps, pelvic pressure, or nagging pain in the abdominal area.  You have persistent nausea, vomiting, or diarrhea.  You have a bad smelling vaginal discharge.  You have pain when you urinate. Get help right away if:  You have a fever.  You are leaking fluid from your vagina.  You have spotting or bleeding from your vagina.  You have severe abdominal cramping or pain.  You have rapid weight gain or weight loss.  You have shortness of breath with chest pain.  You notice sudden or extreme swelling of your face, hands, ankles, feet, or legs.  You have not felt your baby move in over an hour.  You have severe headaches that do not go away when you take medicine.  You have vision changes. Summary  The second trimester is from week 14 through week 27 (months 4 through 6). It is also a time when the fetus is growing rapidly.  Your body goes through many changes during pregnancy. The changes vary from woman to woman.  Avoid all smoking, herbs, alcohol, and unprescribed drugs. These chemicals affect the formation and growth your baby.  Do not use any tobacco products, such as cigarettes, chewing  tobacco, and e-cigarettes. If you need help quitting, ask your health care provider.  Contact your health care provider if you have any questions. Keep all prenatal visits as told by your health care provider. This is important. This information is not intended to replace advice given to you by your health care provider. Make sure you discuss any questions you have with your health care provider. Document Revised: 07/28/2018 Document Reviewed: 05/11/2016 Elsevier Patient Education  2020 [employer].

## 2019-12-11 ENCOUNTER — Ambulatory Visit (INDEPENDENT_AMBULATORY_CARE_PROVIDER_SITE_OTHER): Payer: Medicaid Other | Admitting: Obstetrics and Gynecology

## 2019-12-11 ENCOUNTER — Other Ambulatory Visit: Payer: Self-pay

## 2019-12-11 VITALS — BP 100/52 | Wt 131.0 lb

## 2019-12-11 DIAGNOSIS — Z3A16 16 weeks gestation of pregnancy: Secondary | ICD-10-CM

## 2019-12-11 DIAGNOSIS — Z3482 Encounter for supervision of other normal pregnancy, second trimester: Secondary | ICD-10-CM

## 2019-12-11 DIAGNOSIS — Z3480 Encounter for supervision of other normal pregnancy, unspecified trimester: Secondary | ICD-10-CM

## 2019-12-11 DIAGNOSIS — Z363 Encounter for antenatal screening for malformations: Secondary | ICD-10-CM

## 2019-12-11 NOTE — Progress Notes (Signed)
ROB

## 2019-12-11 NOTE — Progress Notes (Signed)
    Routine Prenatal Care Visit  Subjective  Suzanne Nelson is a 24 y.o. G3P0020 at [redacted]w[redacted]d being seen today for ongoing prenatal care.  She is currently monitored for the following issues for this low-risk pregnancy and has Major depressive disorder, single episode, moderate (HCC); Attention deficit hyperactivity disorder, combined type; Oppositional defiant disorder; Hydronephrosis; Calculus of distal left ureter; Left ureteral stone; Supervision of other normal pregnancy, antepartum; and BV (bacterial vaginosis) on their problem list.  ----------------------------------------------------------------------------------- Patient reports no complaints.   Contractions: Not present. Vag. Bleeding: None.  Movement: Absent. Denies leaking of fluid.  ----------------------------------------------------------------------------------- The following portions of the patient's history were reviewed and updated as appropriate: allergies, current medications, past family history, past medical history, past social history, past surgical history and problem list. Problem list updated.   Objective  Blood pressure (!) 100/52, weight 131 lb (59.4 kg), last menstrual period 07/10/2019. Pregravid weight 130 lb (59 kg) Total Weight Gain 1 lb (0.454 kg) Urinalysis:      Fetal Status: Fetal Heart Rate (bpm): 155   Movement: Absent     General:  Alert, oriented and cooperative. Patient is in no acute distress.  Skin: Skin is warm and dry. No rash noted.   Cardiovascular: Normal heart rate noted  Respiratory: Normal respiratory effort, no problems with respiration noted  Abdomen: Soft, gravid, appropriate for gestational age. Pain/Pressure: Absent     Pelvic:  Cervical exam deferred        Extremities: Normal range of motion.     ental Status: Normal mood and affect. Normal behavior. Normal judgment and thought content.     Assessment   24 y.o. [id number] at [redacted]w[redacted]d by  05/23/2020, Date entered prior to episode  creation presenting for routine prenatal visit  Plan    Gestational age appropriate obstetric precautions including but not limited to vaginal bleeding, contractions, leaking of fluid and fetal movement were reviewed in detail with the patient.    Return in about 4 weeks (around 01/08/2020) for ROB and anatomy scan.  Vena Austria, MD, [facility], [facility] 12/11/2019, 4:50 PM

## 2019-12-26 ENCOUNTER — Ambulatory Visit (INDEPENDENT_AMBULATORY_CARE_PROVIDER_SITE_OTHER): Payer: Medicaid Other | Admitting: Obstetrics and Gynecology

## 2019-12-26 ENCOUNTER — Other Ambulatory Visit: Payer: Self-pay

## 2019-12-26 ENCOUNTER — Ambulatory Visit (INDEPENDENT_AMBULATORY_CARE_PROVIDER_SITE_OTHER): Payer: Medicaid Other

## 2019-12-26 VITALS — BP 120/80 | Wt 135.0 lb

## 2019-12-26 DIAGNOSIS — N201 Calculus of ureter: Secondary | ICD-10-CM

## 2019-12-26 DIAGNOSIS — O99282 Endocrine, nutritional and metabolic diseases complicating pregnancy, second trimester: Secondary | ICD-10-CM

## 2019-12-26 DIAGNOSIS — O99342 Other mental disorders complicating pregnancy, second trimester: Secondary | ICD-10-CM | POA: Diagnosis not present

## 2019-12-26 DIAGNOSIS — Z3480 Encounter for supervision of other normal pregnancy, unspecified trimester: Secondary | ICD-10-CM

## 2019-12-26 DIAGNOSIS — O2332 Infections of other parts of urinary tract in pregnancy, second trimester: Secondary | ICD-10-CM | POA: Diagnosis not present

## 2019-12-26 DIAGNOSIS — Z3A18 18 weeks gestation of pregnancy: Secondary | ICD-10-CM

## 2019-12-26 DIAGNOSIS — Z363 Encounter for antenatal screening for malformations: Secondary | ICD-10-CM

## 2019-12-26 DIAGNOSIS — Z3A15 15 weeks gestation of pregnancy: Secondary | ICD-10-CM

## 2019-12-26 LAB — POCT URINALYSIS DIPSTICK OB
Glucose, UA: NEGATIVE
POC,PROTEIN,UA: NEGATIVE

## 2019-12-26 NOTE — Progress Notes (Signed)
Routine Prenatal Care Visit  Subjective  Suzanne Nelson is a 24 y.o. G3P0020 at [redacted]w[redacted]d being seen today for ongoing prenatal care.  She is currently monitored for the following issues for this low-risk pregnancy and has Major depressive disorder, single episode, moderate (HCC); Attention deficit hyperactivity disorder, combined type; Oppositional defiant disorder; Hydronephrosis; Calculus of distal left ureter; Left ureteral stone; Supervision of other normal pregnancy, antepartum; and BV (bacterial vaginosis) on their problem list.  ----------------------------------------------------------------------------------- Patient reports no complaints.   Contractions: Not present. Vag. Bleeding: None.  Movement: Absent. Denies leaking of fluid.  ----------------------------------------------------------------------------------- The following portions of the patient's history were reviewed and updated as appropriate: allergies, current medications, past family history, past medical history, past social history, past surgical history and problem list. Problem list updated.   Objective  Blood pressure 120/80, weight 135 lb (61.2 kg), last menstrual period 07/10/2019. Pregravid weight 130 lb (59 kg) Total Weight Gain 5 lb (2.268 kg) Urinalysis:      Fetal Status: Fetal Heart Rate (bpm): 145   Movement: Absent     General:  Alert, oriented and cooperative. Patient is in no acute distress.  Skin: Skin is warm and dry. No rash noted.   Cardiovascular: Normal heart rate noted  Respiratory: Normal respiratory effort, no problems with respiration noted  Abdomen: Soft, gravid, appropriate for gestational age. Pain/Pressure: Present     Pelvic:  Cervical exam deferred        Extremities: Normal range of motion.     ental Status: Normal mood and affect. Normal behavior. Normal judgment and thought content.   US OB Comp + 14 Wk  Result Date: 12/26/2019 Patient Name: Suzanne Nelson DOB: Jul 19, 1995 MRN:  [id number] ULTRASOUND REPORT Location: [facility] Date of Service: 12/26/2019 Indications:Anatomy Ultrasound Findings: Mason Jim intrauterine pregnancy is visualized with FHR at 143 BPM. Biometrics give an (U/S) Gestational age of [redacted]w[redacted]d and an (U/S) EDD of 05/20/2020; this correlates with the clinically established Estimated Date of Delivery: 05/23/20 Fetal presentation is Cephalic. EFW: 284 g ( 10 oz ) Placenta: anterior. Grade: 1 AFI: subjectively normal. Anatomic survey is complete and normal; Gender - female.  Impression: 1. [redacted]w[redacted]d Viable Singleton Intrauterine pregnancy by U/S. 2. (U/S) EDD is consistent with Clinically established Estimated Date of Delivery: 05/23/20 . 3. Normal Anatomy Scan Recommendations: 1.Clinical correlation with the patient's History and Physical Exam. Deanna Artis, RT  There is a singleton gestation with subjectively normal amniotic fluid volume. The fetal biometry correlates with established dating. Detailed evaluation of the fetal anatomy was performed.The fetal anatomical survey appears within normal limits within the resolution of ultrasound as described above.  It must be noted that a normal ultrasound is unable to rule out fetal aneuploidy, subtle defects such as small ASD or VDS may also not be visible on imaging.  Vena Austria, MD, [facility], [facility] 12/26/2019, 2:42 PM     Assessment   24 y.o. G3P0020 at [redacted]w[redacted]d by  05/23/2020, Date entered prior to episode creation presenting for routine prenatal visit  Plan   THIRD Problems (from 10/17/19 to present)    Problem Noted Resolved   Supervision of other normal pregnancy, antepartum 10/17/2019 by Mirna Mires, CNM No   Overview Addendum 12/31/2019  9:29 PM by Vena Austria, MD     Clinic [facility] Prenatal Labs  Dating Completed. EDD  Supports the LMP dating Blood type: --/--/O POS Performed at [facility], [address] [place], [place]  [address]  (06/11 1751)     Genetic Screen Inheritest: Neg SMA, Neg Fragile-X, CF carrier     NIPS: Normal XX Antibody:   Anatomic [place] Complete 9/8 Rubella:    GTT Early:               Third trimester:  RPR: NON REACTIVE (06/11 0918)   Flu vaccine  HBsAg:   TDaP vaccine                                               Rhogam: HIV: Non Reactive (06/11 0918)   Baby Food                                               GBS: (For PCN allergy, check sensitivities)  Contraception  Pap: 10/17/2019 NILM  Circumcision    Pediatrician    Support Person             Previous Version      Gestational age appropriate obstetric precautions including but not limited to vaginal bleeding, contractions, leaking of fluid and fetal movement were reviewed in detail with the patient.    Return in about 4 weeks (around 01/23/2020) for ROB.  Vena Austria, MD, [facility], [facility] 12/26/2019, 2:58 PM

## 2020-01-07 ENCOUNTER — Telehealth: Payer: Self-pay

## 2020-01-07 NOTE — Telephone Encounter (Signed)
Suzanne Nelson, this pt states she tried to call to make an appt but she was sent to triage. She just needs to get an appt scheduled for some issues she is having. Can you please help pt get this scheduled? Thank you so much! (see pt message in mychart)

## 2020-01-07 NOTE — Telephone Encounter (Signed)
Patient is scheduled   

## 2020-01-09 ENCOUNTER — Encounter: Payer: Medicaid Other | Admitting: Obstetrics and Gynecology

## 2020-01-10 ENCOUNTER — Ambulatory Visit (INDEPENDENT_AMBULATORY_CARE_PROVIDER_SITE_OTHER): Payer: Medicaid Other | Admitting: Obstetrics and Gynecology

## 2020-01-10 ENCOUNTER — Encounter: Payer: Self-pay | Admitting: Obstetrics and Gynecology

## 2020-01-10 ENCOUNTER — Other Ambulatory Visit: Payer: Self-pay

## 2020-01-10 VITALS — BP 96/60 | Wt 141.0 lb

## 2020-01-10 DIAGNOSIS — Z3482 Encounter for supervision of other normal pregnancy, second trimester: Secondary | ICD-10-CM

## 2020-01-10 DIAGNOSIS — Z3A20 20 weeks gestation of pregnancy: Secondary | ICD-10-CM

## 2020-01-10 DIAGNOSIS — R399 Unspecified symptoms and signs involving the genitourinary system: Secondary | ICD-10-CM | POA: Diagnosis not present

## 2020-01-10 DIAGNOSIS — M549 Dorsalgia, unspecified: Secondary | ICD-10-CM

## 2020-01-10 DIAGNOSIS — O99891 Other specified diseases and conditions complicating pregnancy: Secondary | ICD-10-CM

## 2020-01-10 LAB — POCT URINALYSIS DIPSTICK OB
Bilirubin, UA: NEGATIVE
Blood, UA: NEGATIVE
Glucose, UA: NEGATIVE
Ketones, UA: NEGATIVE
Nitrite, UA: NEGATIVE
Spec Grav, UA: 1.02 (ref 1.010–1.025)
Urobilinogen, UA: 1 E.U./dL
pH, UA: 7.5 (ref 5.0–8.0)

## 2020-01-10 MED ORDER — CYCLOBENZAPRINE HCL 10 MG PO TABS: 10.0000 mg | ORAL_TABLET | Freq: Three times a day (TID) | ORAL | 1 refills | Status: DC | PRN

## 2020-01-10 MED ORDER — NITROFURANTOIN MONOHYD MACRO 100 MG PO CAPS: 100.0000 mg | ORAL_CAPSULE | Freq: Two times a day (BID) | ORAL | 0 refills | Status: DC

## 2020-01-10 NOTE — Progress Notes (Signed)
Routine Prenatal Care Visit  Subjective  Suzanne Nelson is a 24 y.o. G3P0020 at [redacted]w[redacted]d being seen today for ongoing prenatal care.  She is currently monitored for the following issues for this low-risk pregnancy and has Major depressive disorder, single episode, moderate (HCC); Attention deficit hyperactivity disorder, combined type; Oppositional defiant disorder; Hydronephrosis; Calculus of distal left ureter; Left ureteral stone; Supervision of other normal pregnancy, antepartum; and BV (bacterial vaginosis) on their problem list.  ----------------------------------------------------------------------------------- Patient reports lower abdominal pain from hip to hip in general. While voiding she has sharp, stabbing pain across her lower abdomen. She also notes worse pain when she has a full bladder. She denies hematuria and new back pain. She denies lateral back pain, though she has a history of back pain. She indicates more spinal back pain.  She denies fevers, chills. She denies vaginal irritative symptoms of itching, burning, and irritation.    Contractions: Not present. Vag. Bleeding: None.  Movement: Absent. Leaking Fluid denies.  ----------------------------------------------------------------------------------- The following portions of the patient's history were reviewed and updated as appropriate: allergies, current medications, past family history, past medical history, past social history, past surgical history and problem list. Problem list updated.  Objective  Blood pressure 96/60, weight 141 lb (64 kg), last menstrual period 07/10/2019. Pregravid weight 130 lb (59 kg) Total Weight Gain 11 lb (4.99 kg) Urinalysis: Urine Protein Trace  Urine Glucose Negative  Fetal Status: Fetal Heart Rate (bpm): 145   Movement: Absent     General:  Alert, oriented and cooperative. Patient is in no acute distress.  Skin: Skin is warm and dry. No rash noted.   Cardiovascular: Normal heart rate  noted  Respiratory: Normal respiratory effort, no problems with respiration noted  Abdomen: Soft, gravid, appropriate for gestational age. Pain/Pressure: Present , no CVAT  Pelvic:  Cervical exam deferred        Extremities: Normal range of motion.     Mental Status: Normal mood and affect. Normal behavior. Normal judgment and thought content.   Assessment   24 y.o. G3P0020 at [redacted]w[redacted]d by  05/23/2020, Date entered prior to episode creation presenting for work-in prenatal visit  Plan   THIRD Problems (from 10/17/19 to present)    Problem Noted Resolved   Supervision of other normal pregnancy, antepartum 10/17/2019 by Mirna Mires, CNM No   Overview Addendum 12/31/2019  9:30 PM by Vena Austria, MD     Clinic [facility] Prenatal Labs  Dating Completed. EDD  Supports the LMP dating Blood type: --/--/O POS Performed at [facility], [address] [place], [place] [address]  [phone])   Genetic Screen Inheritest: Neg SMA, Neg Fragile-X, CF carrier     NIPS: Normal XX Antibody:   Anatomic [place] Complete 9/8 Rubella:    GTT Early:               Third trimester:  RPR: NON REACTIVE (06/11 0918)   Flu vaccine  HBsAg:   TDaP vaccine                                               Rhogam: N/A HIV: Non Reactive (06/11 0918)   Baby Food  GBS: (For PCN allergy, check sensitivities)  Contraception  Pap:10/17/2019 NILM  Circumcision    Pediatrician    Support Person           Previous Version       Preterm labor symptoms and general obstetric precautions including but not limited to vaginal bleeding, contractions, leaking of fluid and fetal movement were reviewed in detail with the patient. Please refer to After Visit Summary for other counseling recommendations.   - Urine culture - Rx for Macrobid for presumed UTI - Flexeril for back spasms associated with pregnancy  Return for Keep previously scheduled appointment.  Thomasene Mohair,  MD, [facility], [facility] 01/10/2020 5:14 PM

## 2020-01-13 LAB — URINE CULTURE

## 2020-01-23 ENCOUNTER — Encounter: Payer: Medicaid Other | Admitting: Obstetrics & Gynecology

## 2020-01-24 ENCOUNTER — Encounter: Payer: Self-pay | Admitting: Advanced Practice Midwife

## 2020-01-24 ENCOUNTER — Ambulatory Visit (INDEPENDENT_AMBULATORY_CARE_PROVIDER_SITE_OTHER): Payer: Medicaid Other | Admitting: Advanced Practice Midwife

## 2020-01-24 ENCOUNTER — Other Ambulatory Visit: Payer: Self-pay

## 2020-01-24 VITALS — BP 120/80 | Wt 150.0 lb

## 2020-01-24 DIAGNOSIS — Z131 Encounter for screening for diabetes mellitus: Secondary | ICD-10-CM

## 2020-01-24 DIAGNOSIS — Z3A22 22 weeks gestation of pregnancy: Secondary | ICD-10-CM

## 2020-01-24 DIAGNOSIS — Z113 Encounter for screening for infections with a predominantly sexual mode of transmission: Secondary | ICD-10-CM

## 2020-01-24 DIAGNOSIS — Z3480 Encounter for supervision of other normal pregnancy, unspecified trimester: Secondary | ICD-10-CM

## 2020-01-24 DIAGNOSIS — Z130 Encounter for screening for diseases of the blood and blood-forming organs and certain disorders involving the immune mechanism: Secondary | ICD-10-CM

## 2020-01-24 LAB — POCT URINALYSIS DIPSTICK OB: Glucose, UA: NEGATIVE

## 2020-01-24 NOTE — Progress Notes (Signed)
Routine Prenatal Care Visit  Subjective  Suzanne Nelson is a 24 y.o. G3P0020 at [redacted]w[redacted]d being seen today for ongoing prenatal care.  She is currently monitored for the following issues for this low-risk pregnancy and has Major depressive disorder, single episode, moderate (HCC); Attention deficit hyperactivity disorder, combined type; Oppositional defiant disorder; Hydronephrosis; Calculus of distal left ureter; Left ureteral stone; Supervision of other normal pregnancy, antepartum; and BV (bacterial vaginosis) on their problem list.  ----------------------------------------------------------------------------------- Patient reports heartburn.  We discussed comfort measures. She reports a rough time currently with family issues. Her psychiatrist increased her dose of Lexapro. Contractions: Not present. Vag. Bleeding: None.  Movement: Present. Leaking Fluid denies.  ----------------------------------------------------------------------------------- The following portions of the patient's history were reviewed and updated as appropriate: allergies, current medications, past family history, past medical history, past social history, past surgical history and problem list. Problem list updated.  Objective  Blood pressure 120/80, weight 150 lb (68 kg), last menstrual period 07/10/2019. Pregravid weight 130 lb (59 kg) Total Weight Gain 20 lb (9.072 kg) Urinalysis: Urine Protein    Urine Glucose    Fetal Status: Fetal Heart Rate (bpm): 138 Fundal Height: 23 cm Movement: Present     General:  Alert, oriented and cooperative. Patient is in no acute distress.  Skin: Skin is warm and dry. No rash noted.   Cardiovascular: Normal heart rate noted  Respiratory: Normal respiratory effort, no problems with respiration noted  Abdomen: Soft, gravid, appropriate for gestational age. Pain/Pressure: Present     Pelvic:  Cervical exam deferred        Extremities: Normal range of motion.  Edema: None  Mental  Status: Normal mood and affect. Normal behavior. Normal judgment and thought content.   Assessment   24 y.o. G3P0020 at [redacted]w[redacted]d by  05/23/2020, Date entered prior to episode creation presenting for routine prenatal visit  Plan   THIRD Problems (from 10/17/19 to present)    Problem Noted Resolved   Supervision of other normal pregnancy, antepartum 10/17/2019 by Mirna Mires, CNM No   Overview Addendum 12/31/2019  9:30 PM by Vena Austria, MD     Clinic [facility] Prenatal Labs  Dating Completed. EDD  Supports the LMP dating Blood type: --/--/O POS Performed at [facility], [address] [place], [place] [address]  [phone])   Genetic Screen Inheritest: Neg SMA, Neg Fragile-X, CF carrier     NIPS: Normal XX Antibody:   Anatomic [place] Complete 9/8 Rubella:    GTT Early:               Third trimester:  RPR: NON REACTIVE (06/11 0918)   Flu vaccine  HBsAg:   TDaP vaccine                                               Rhogam: N/A HIV: Non Reactive (06/11 0918)   Baby Food                                               GBS: (For PCN allergy, check sensitivities)  Contraception  Pap:10/17/2019 NILM  Circumcision    Pediatrician    Support Person             Previous  Version    Heartburn: small/frequent meals, sit up after eating, avoid triggers, OTC Pepcid  Preterm labor symptoms and general obstetric precautions including but not limited to vaginal bleeding, contractions, leaking of fluid and fetal movement were reviewed in detail with the patient.    Return in about 4 weeks (around 02/21/2020) for 28 wk labs/NOB panel and rob.  Tresea Mall, CNM 01/24/2020 4:15 PM

## 2020-02-22 ENCOUNTER — Other Ambulatory Visit: Payer: Self-pay

## 2020-02-22 ENCOUNTER — Encounter: Payer: Self-pay | Admitting: Obstetrics and Gynecology

## 2020-02-22 ENCOUNTER — Other Ambulatory Visit: Payer: Medicaid Other

## 2020-02-22 ENCOUNTER — Ambulatory Visit (INDEPENDENT_AMBULATORY_CARE_PROVIDER_SITE_OTHER): Payer: Medicaid Other | Admitting: Obstetrics and Gynecology

## 2020-02-22 VITALS — BP 113/68 | Ht 62.0 in | Wt 161.8 lb

## 2020-02-22 DIAGNOSIS — Z3480 Encounter for supervision of other normal pregnancy, unspecified trimester: Secondary | ICD-10-CM

## 2020-02-22 DIAGNOSIS — Z131 Encounter for screening for diabetes mellitus: Secondary | ICD-10-CM

## 2020-02-22 DIAGNOSIS — Z3A27 27 weeks gestation of pregnancy: Secondary | ICD-10-CM

## 2020-02-22 DIAGNOSIS — Z113 Encounter for screening for infections with a predominantly sexual mode of transmission: Secondary | ICD-10-CM

## 2020-02-22 DIAGNOSIS — Z130 Encounter for screening for diseases of the blood and blood-forming organs and certain disorders involving the immune mechanism: Secondary | ICD-10-CM

## 2020-02-22 LAB — POCT URINALYSIS DIPSTICK OB: Glucose, UA: NEGATIVE

## 2020-02-22 LAB — OB RESULTS CONSOLE VARICELLA ZOSTER ANTIBODY, IGG: Varicella: IMMUNE

## 2020-02-22 NOTE — Progress Notes (Signed)
Routine Prenatal Care Visit  Subjective  Suzanne Nelson is a 24 y.o. G3P0020 at [redacted]w[redacted]d being seen today for ongoing prenatal care.  She is currently monitored for the following issues for this low-risk pregnancy and has Major depressive disorder, single episode, moderate (HCC); Attention deficit hyperactivity disorder, combined type; Oppositional defiant disorder; Hydronephrosis; Calculus of distal left ureter; Left ureteral stone; Supervision of other normal pregnancy, antepartum; and BV (bacterial vaginosis) on their problem list.  ----------------------------------------------------------------------------------- Patient reports no complaints.   Contractions: Not present. Vag. Bleeding: None.  Movement: Present. Denies leaking of fluid.  ----------------------------------------------------------------------------------- The following portions of the patient's history were reviewed and updated as appropriate: allergies, current medications, past family history, past medical history, past social history, past surgical history and problem list. Problem list updated.   Objective  Blood pressure 113/68, height 5\' 2"  (1.575 m), weight 161 lb 12.8 oz (73.4 kg), last menstrual period 07/10/2019. Pregravid weight 130 lb (59 kg) Total Weight Gain 31 lb 12.8 oz (14.4 kg) Urinalysis:      Fetal Status: Fetal Heart Rate (bpm): 140 Fundal Height: 27 cm Movement: Present     General:  Alert, oriented and cooperative. Patient is in no acute distress.  Skin: Skin is warm and dry. No rash noted.   Cardiovascular: Normal heart rate noted  Respiratory: Normal respiratory effort, no problems with respiration noted  Abdomen: Soft, gravid, appropriate for gestational age. Pain/Pressure: Present     Pelvic:  Cervical exam deferred        Extremities: Normal range of motion.  Edema: None  Mental Status: Normal mood and affect. Normal behavior. Normal judgment and thought content.     Assessment   24  y.o. G3P0020 at [redacted]w[redacted]d by  05/23/2020, Date entered prior to episode creation presenting for routine prenatal visit  Plan   THIRD Problems (from 10/17/19 to present)    Problem Noted Resolved   Supervision of other normal pregnancy, antepartum 10/17/2019 by 10/19/2019, CNM No   Overview Addendum 02/22/2020  3:23 PM by 13/08/2019, MD     Nursing Staff Provider  Office Location  [facility] Dating   6 wk Natale Milch  Language  English Anatomy US  normal  Flu Vaccine  Declined Genetic Screen  NIPS: normal xx   TDaP vaccine    Hgb A1C or  GTT Early : Third trimester :   Rhogam   not needed   LAB RESULTS   Feeding Plan Pumping, desires to mostly bottle feed/ formula Blood Type --/--/O POS Performed at [facility], [address] [place], [place]  [phone])   Contraception  Antibody    Circumcision  Rubella    Pediatrician   RPR NON REACTIVE (06/11 0918)   Support Person  HBsAg     Prenatal Classes  HIV Non Reactive (06/11 0918)    Varicella    BTL Consent  GBS  (For PCN allergy, check sensitivities)        VBAC Consent  Pap   2021 NIL     Hgb Electro   not needed  Covid Declines CF - carrier     SMA  normal               Previous Version       Discussed breastfeeding/pumping with the patient.  Encouraged patient to find out the name of her possible anxiety medications so that we can review if they would be compatible with breastfeeding. Discussed using  the [employer]act[employer] app as reference or Mother's Milk book as a reference tool.   Gestational age appropriate obstetric precautions including but not limited to vaginal bleeding, contractions, leaking of fluid and fetal movement were reviewed in detail with the patient.    Return in about 2 weeks (around 03/07/2020) for ROB in person.  Natale Milch MD [facility], [facility] 02/22/2020, 3:25 PM

## 2020-02-22 NOTE — Progress Notes (Signed)
Pt here for 28 wk labs/ NOB and ROB visit.

## 2020-02-22 NOTE — Patient Instructions (Signed)
Third Trimester of Pregnancy The third trimester is from week 28 through week 40 (months 7 through 9). The third trimester is a time when the unborn baby (fetus) is growing rapidly. At the end of the ninth month, the fetus is about 20 inches in length and weighs 6-10 pounds. Body changes during your third trimester Your body will continue to go through many changes during pregnancy. The changes vary from woman to woman. During the third trimester:  Your weight will continue to increase. You can expect to gain 25-35 pounds (11-16 kg) by the end of the pregnancy.  You may begin to get stretch marks on your hips, abdomen, and breasts.  You may urinate more often because the fetus is moving lower into your pelvis and pressing on your bladder.  You may develop or continue to have heartburn. This is caused by increased hormones that slow down muscles in the digestive tract.  You may develop or continue to have constipation because increased hormones slow digestion and cause the muscles that push waste through your intestines to relax.  You may develop hemorrhoids. These are swollen veins (varicose veins) in the rectum that can itch or be painful.  You may develop swollen, bulging veins (varicose veins) in your legs.  You may have increased body aches in the pelvis, back, or thighs. This is due to weight gain and increased hormones that are relaxing your joints.  You may have changes in your hair. These can include thickening of your hair, rapid growth, and changes in texture. Some women also have hair loss during or after pregnancy, or hair that feels dry or thin. Your hair will most likely return to normal after your baby is born.  Your breasts will continue to grow and they will continue to become tender. A yellow fluid (colostrum) may leak from your breasts. This is the first milk you are producing for your baby.  Your belly button may stick out.  You may notice more swelling in your hands,  face, or ankles.  You may have increased tingling or numbness in your hands, arms, and legs. The skin on your belly may also feel numb.  You may feel short of breath because of your expanding uterus.  You may have more problems sleeping. This can be caused by the size of your belly, increased need to urinate, and an increase in your body's metabolism.  You may notice the fetus "dropping," or moving lower in your abdomen (lightening).  You may have increased vaginal discharge.  You may notice your joints feel loose and you may have pain around your pelvic bone. What to expect at prenatal visits You will have prenatal exams every 2 weeks until week 36. Then you will have weekly prenatal exams. During a routine prenatal visit:  You will be weighed to make sure you and the baby are growing normally.  Your blood pressure will be taken.  Your abdomen will be measured to track your baby's growth.  The fetal heartbeat will be listened to.  Any test results from the previous visit will be discussed.  You may have a cervical check near your due date to see if your cervix has softened or thinned (effaced).  You will be tested for Group B streptococcus. This happens between 35 and 37 weeks. Your health care provider may ask you:  What your birth plan is.  How you are feeling.  If you are feeling the baby move.  If you have had any abnormal   symptoms, such as leaking fluid, bleeding, severe headaches, or abdominal cramping.  If you are using any tobacco products, including cigarettes, chewing tobacco, and electronic cigarettes.  If you have any questions. Other tests or screenings that may be performed during your third trimester include:  Blood tests that check for low iron levels (anemia).  Fetal testing to check the health, activity level, and growth of the fetus. Testing is done if you have certain medical conditions or if there are problems during the pregnancy.  Nonstress test  (NST). This test checks the health of your baby to make sure there are no signs of problems, such as the baby not getting enough oxygen. During this test, a belt is placed around your belly. The baby is made to move, and its heart rate is monitored during movement. What is false labor? False labor is a condition in which you feel small, irregular tightenings of the muscles in the womb (contractions) that usually go away with rest, changing position, or drinking water. These are called Braxton Hicks contractions. Contractions may last for hours, days, or even weeks before true labor sets in. If contractions come at regular intervals, become more frequent, increase in intensity, or become painful, you should see your health care provider. What are the signs of labor?  Abdominal cramps.  Regular contractions that start at 10 minutes apart and become stronger and more frequent with time.  Contractions that start on the top of the uterus and spread down to the lower abdomen and back.  Increased pelvic pressure and dull back pain.  A watery or bloody mucus discharge that comes from the vagina.  Leaking of amniotic fluid. This is also known as your "water breaking." It could be a slow trickle or a gush. Let your health care provider know if it has a color or strange odor. If you have any of these signs, call your health care provider right away, even if it is before your due date. Follow these instructions at home: Medicines  Follow your health care provider's instructions regarding medicine use. Specific medicines may be either safe or unsafe to take during pregnancy.  Take a prenatal vitamin that contains at least 600 micrograms (mcg) of folic acid.  If you develop constipation, try taking a stool softener if your health care provider approves. Eating and drinking   Eat a balanced diet that includes fresh fruits and vegetables, whole grains, good sources of protein such as meat, eggs, or tofu,  and low-fat dairy. Your health care provider will help you determine the amount of weight gain that is right for you.  Avoid raw meat and uncooked cheese. These carry germs that can cause birth defects in the baby.  If you have low calcium intake from food, talk to your health care provider about whether you should take a daily calcium supplement.  Eat four or five small meals rather than three large meals a day.  Limit foods that are high in fat and processed sugars, such as fried and sweet foods.  To prevent constipation: ? Drink enough fluid to keep your urine clear or pale yellow. ? Eat foods that are high in fiber, such as fresh fruits and vegetables, whole grains, and beans. Activity  Exercise only as directed by your health care provider. Most women can continue their usual exercise routine during pregnancy. Try to exercise for 30 minutes at least 5 days a week. Stop exercising if you experience uterine contractions.  Avoid heavy lifting.  Do   not exercise in extreme heat or humidity, or at high altitudes.  Wear low-heel, comfortable shoes.  Practice good posture.  You may continue to have sex unless your health care provider tells you otherwise. Relieving pain and discomfort  Take frequent breaks and rest with your legs elevated if you have leg cramps or low back pain.  Take warm sitz baths to soothe any pain or discomfort caused by hemorrhoids. Use hemorrhoid cream if your health care provider approves.  Wear a good support bra to prevent discomfort from breast tenderness.  If you develop varicose veins: ? Wear support pantyhose or compression stockings as told by your healthcare provider. ? Elevate your feet for 15 minutes, 3-4 times a day. Prenatal care  Write down your questions. Take them to your prenatal visits.  Keep all your prenatal visits as told by your health care provider. This is important. Safety  Wear your seat belt at all times when driving.  Make  a list of emergency phone numbers, including numbers for family, friends, the hospital, and police and fire departments. General instructions  Avoid cat litter boxes and soil used by cats. These carry germs that can cause birth defects in the baby. If you have a cat, ask someone to clean the litter box for you.  Do not travel far distances unless it is absolutely necessary and only with the approval of your health care provider.  Do not use hot tubs, steam rooms, or saunas.  Do not drink alcohol.  Do not use any products that contain nicotine or tobacco, such as cigarettes and e-cigarettes. If you need help quitting, ask your health care provider.  Do not use any medicinal herbs or unprescribed drugs. These chemicals affect the formation and growth of the baby.  Do not douche or use tampons or scented sanitary pads.  Do not cross your legs for long periods of time.  To prepare for the arrival of your baby: ? Take prenatal classes to understand, practice, and ask questions about labor and delivery. ? Make a trial run to the hospital. ? Visit the hospital and tour the maternity area. ? Arrange for maternity or paternity leave through employers. ? Arrange for family and friends to take care of pets while you are in the hospital. ? Purchase a rear-facing car seat and make sure you know how to install it in your car. ? Pack your hospital bag. ? Prepare the baby's nursery. Make sure to remove all pillows and stuffed animals from the baby's crib to prevent suffocation.  Visit your dentist if you have not gone during your pregnancy. Use a soft toothbrush to brush your teeth and be gentle when you floss. Contact a health care provider if:  You are unsure if you are in labor or if your water has broken.  You become dizzy.  You have mild pelvic cramps, pelvic pressure, or nagging pain in your abdominal area.  You have lower back pain.  You have persistent nausea, vomiting, or  diarrhea.  You have an unusual or bad smelling vaginal discharge.  You have pain when you urinate. Get help right away if:  Your water breaks before 37 weeks.  You have regular contractions less than 5 minutes apart before 37 weeks.  You have a fever.  You are leaking fluid from your vagina.  You have spotting or bleeding from your vagina.  You have severe abdominal pain or cramping.  You have rapid weight loss or weight gain.  You have   shortness of breath with chest pain.  You notice sudden or extreme swelling of your face, hands, ankles, feet, or legs.  Your baby makes fewer than 10 movements in 2 hours.  You have severe headaches that do not go away when you take medicine.  You have vision changes. Summary  The third trimester is from week 28 through week 40, months 7 through 9. The third trimester is a time when the unborn baby (fetus) is growing rapidly.  During the third trimester, your discomfort may increase as you and your baby continue to gain weight. You may have abdominal, leg, and back pain, sleeping problems, and an increased need to urinate.  During the third trimester your breasts will keep growing and they will continue to become tender. A yellow fluid (colostrum) may leak from your breasts. This is the first milk you are producing for your baby.  False labor is a condition in which you feel small, irregular tightenings of the muscles in the womb (contractions) that eventually go away. These are called Braxton Hicks contractions. Contractions may last for hours, days, or even weeks before true labor sets in.  Signs of labor can include: abdominal cramps; regular contractions that start at 10 minutes apart and become stronger and more frequent with time; watery or bloody mucus discharge that comes from the vagina; increased pelvic pressure and dull back pain; and leaking of amniotic fluid. This information is not intended to replace advice given to you by your  health care provider. Make sure you discuss any questions you have with your health care provider. Document Revised: 07/27/2018 Document Reviewed: 05/11/2016 Elsevier Patient Education  2020 [employer]  

## 2020-02-23 LAB — 28 WEEK RH+PANEL
Basophils Absolute: 0.1 10*3/uL (ref 0.0–0.2)
Basos: 0 %
EOS (ABSOLUTE): 0.3 10*3/uL (ref 0.0–0.4)
Eos: 2 %
Gestational Diabetes Screen: 106 mg/dL (ref 65–139)
HIV Screen 4th Generation wRfx: NONREACTIVE
Hematocrit: 34.2 % (ref 34.0–46.6)
Hemoglobin: 11.7 g/dL (ref 11.1–15.9)
Immature Grans (Abs): 0.2 10*3/uL — ABNORMAL HIGH (ref 0.0–0.1)
Immature Granulocytes: 1 %
Lymphocytes Absolute: 2.6 10*3/uL (ref 0.7–3.1)
Lymphs: 14 %
MCH: 31.3 pg (ref 26.6–33.0)
MCHC: 34.2 g/dL (ref 31.5–35.7)
MCV: 91 fL (ref 79–97)
Monocytes Absolute: 1.2 10*3/uL — ABNORMAL HIGH (ref 0.1–0.9)
Monocytes: 6 %
Neutrophils Absolute: 14.3 10*3/uL — ABNORMAL HIGH (ref 1.4–7.0)
Neutrophils: 77 %
Platelets: 272 10*3/uL (ref 150–450)
RBC: 3.74 x10E6/uL — ABNORMAL LOW (ref 3.77–5.28)
RDW: 11.9 % (ref 11.7–15.4)
RPR Ser Ql: NONREACTIVE
WBC: 18.6 10*3/uL — ABNORMAL HIGH (ref 3.4–10.8)

## 2020-02-23 LAB — RPR+RH+ABO+RUB AB+AB SCR+CB...
Antibody Screen: NEGATIVE
HIV Screen 4th Generation wRfx: NONREACTIVE
Hematocrit: 34.1 % (ref 34.0–46.6)
Hemoglobin: 11.9 g/dL (ref 11.1–15.9)
Hepatitis B Surface Ag: NEGATIVE
MCH: 31.6 pg (ref 26.6–33.0)
MCHC: 34.9 g/dL (ref 31.5–35.7)
MCV: 91 fL (ref 79–97)
Platelets: 270 10*3/uL (ref 150–450)
RBC: 3.77 x10E6/uL (ref 3.77–5.28)
RDW: 12 % (ref 11.7–15.4)
RPR Ser Ql: NONREACTIVE
Rh Factor: POSITIVE
Rubella Antibodies, IGG: 2.45 index (ref >0.99)
Varicella zoster IgG: 173 index (ref >165)
WBC: 18 10*3/uL — ABNORMAL HIGH (ref 3.4–10.8)

## 2020-03-05 ENCOUNTER — Other Ambulatory Visit: Payer: Self-pay | Admitting: Obstetrics and Gynecology

## 2020-03-05 ENCOUNTER — Other Ambulatory Visit: Payer: Self-pay

## 2020-03-05 ENCOUNTER — Ambulatory Visit (INDEPENDENT_AMBULATORY_CARE_PROVIDER_SITE_OTHER): Payer: Medicaid Other | Admitting: Obstetrics and Gynecology

## 2020-03-05 VITALS — BP 125/76 | Wt 169.0 lb

## 2020-03-05 DIAGNOSIS — Z3480 Encounter for supervision of other normal pregnancy, unspecified trimester: Secondary | ICD-10-CM

## 2020-03-05 DIAGNOSIS — R300 Dysuria: Secondary | ICD-10-CM

## 2020-03-05 NOTE — Progress Notes (Signed)
Routine Prenatal Care Visit  Subjective  Suzanne Nelson is a 24 y.o. G3P0020 at [redacted]w[redacted]d being seen today for ongoing prenatal care.  She is currently monitored for the following issues for this low-risk pregnancy and has Major depressive disorder, single episode, moderate (HCC); Attention deficit hyperactivity disorder, combined type; Oppositional defiant disorder; Hydronephrosis; Calculus of distal left ureter; Left ureteral stone; Supervision of other normal pregnancy, antepartum; and BV (bacterial vaginosis) on their problem list.  ----------------------------------------------------------------------------------- Patient reports no complaints.   Contractions: Not present. Vag. Bleeding: None.  Movement: Present. Denies leaking of fluid.  ----------------------------------------------------------------------------------- The following portions of the patient's history were reviewed and updated as appropriate: allergies, current medications, past family history, past medical history, past social history, past surgical history and problem list. Problem list updated.   Objective  Blood pressure 125/76, weight 169 lb (76.7 kg), last menstrual period 07/10/2019. Pregravid weight 130 lb (59 kg) Total Weight Gain 39 lb (17.7 kg) Urinalysis:      Fetal Status: Fetal Heart Rate (bpm): 145 Fundal Height: 29 cm Movement: Present  Presentation: Vertex  General:  Alert, oriented and cooperative. Patient is in no acute distress.  Skin: Skin is warm and dry. No rash noted.   Cardiovascular: Normal heart rate noted  Respiratory: Normal respiratory effort, no problems with respiration noted  Abdomen: Soft, gravid, appropriate for gestational age. Pain/Pressure: Present     Pelvic:  Cervical exam deferred        Extremities: Normal range of motion.     ental Status: Normal mood and affect. Normal behavior. Normal judgment and thought content.     Assessment   24 y.o. G3P0020 at [redacted]w[redacted]d by   05/23/2020, Date entered prior to episode creation presenting for routine prenatal visit  Plan   THIRD Problems (from 10/17/19 to present)    Problem Noted Resolved   Supervision of other normal pregnancy, antepartum 10/17/2019 by Mirna Mires, CNM No   Overview Addendum 02/22/2020  3:23 PM by Natale Milch, MD     Nursing Staff Provider  Office Location  [facility] Dating   6 wk [place]  Language  English Anatomy US  normal  Flu Vaccine  Declined Genetic Screen  NIPS: normal xx   TDaP vaccine    Hgb A1C or  GTT Early : Third trimester :   Rhogam   not needed   LAB RESULTS   Feeding Plan Pumping, desires to mostly bottle feed/ formula Blood Type --/--/O POS Performed at [facility], [address] [place], [place] [address]  [phone])   Contraception  Antibody    Circumcision  Rubella    Pediatrician   RPR NON REACTIVE (06/11 0918)   Support Person  HBsAg     Prenatal Classes  HIV Non Reactive (06/11 0918)    Varicella    BTL Consent  GBS  (For PCN allergy, check sensitivities)        VBAC Consent  Pap   2021 NIL     Hgb Electro   not needed  Covid Declines CF - carrier     SMA  normal               Previous Version       Gestational age appropriate obstetric precautions including but not limited to vaginal bleeding, contractions, leaking of fluid and fetal movement were reviewed in detail with the patient.    Return in about 2 weeks (around 03/19/2020) for ROB.  Vena Austria,  MD, [facility], [facility] 03/05/2020, 5:06 PM

## 2020-03-05 NOTE — Progress Notes (Signed)
ROB - Having back pain and stomach pain x 2 days

## 2020-03-07 LAB — URINE CULTURE

## 2020-03-17 ENCOUNTER — Encounter: Payer: Self-pay | Admitting: Advanced Practice Midwife

## 2020-03-17 ENCOUNTER — Other Ambulatory Visit: Payer: Self-pay

## 2020-03-17 ENCOUNTER — Observation Stay
Admission: EM | Admit: 2020-03-17 | Discharge: 2020-03-17 | Disposition: A | Discharge status: Home / Self Care | Payer: Medicaid Other | Attending: Obstetrics and Gynecology | Admitting: Obstetrics and Gynecology

## 2020-03-17 DIAGNOSIS — R300 Dysuria: Secondary | ICD-10-CM | POA: Insufficient documentation

## 2020-03-17 DIAGNOSIS — R1030 Lower abdominal pain, unspecified: Secondary | ICD-10-CM | POA: Diagnosis not present

## 2020-03-17 DIAGNOSIS — R102 Pelvic and perineal pain: Secondary | ICD-10-CM | POA: Insufficient documentation

## 2020-03-17 DIAGNOSIS — J45909 Unspecified asthma, uncomplicated: Secondary | ICD-10-CM | POA: Diagnosis not present

## 2020-03-17 DIAGNOSIS — Z79899 Other long term (current) drug therapy: Secondary | ICD-10-CM | POA: Insufficient documentation

## 2020-03-17 DIAGNOSIS — O26893 Other specified pregnancy related conditions, third trimester: Secondary | ICD-10-CM | POA: Diagnosis present

## 2020-03-17 DIAGNOSIS — Z3480 Encounter for supervision of other normal pregnancy, unspecified trimester: Secondary | ICD-10-CM

## 2020-03-17 DIAGNOSIS — Z3A30 30 weeks gestation of pregnancy: Secondary | ICD-10-CM | POA: Diagnosis not present

## 2020-03-17 DIAGNOSIS — F17210 Nicotine dependence, cigarettes, uncomplicated: Secondary | ICD-10-CM | POA: Diagnosis not present

## 2020-03-17 LAB — URINALYSIS, COMPLETE (UACMP) WITH MICROSCOPIC
Bacteria, UA: NONE SEEN
Bilirubin Urine: NEGATIVE
Glucose, UA: NEGATIVE mg/dL
Hgb urine dipstick: NEGATIVE
Ketones, ur: 5 mg/dL — AB
Nitrite: NEGATIVE
Protein, ur: NEGATIVE mg/dL
Specific Gravity, Urine: 1.024 (ref 1.005–1.030)
pH: 5 (ref 5.0–8.0)

## 2020-03-17 NOTE — OB Triage Note (Signed)
Patient arrived in triage with c/o abdominal cramping that started around 0300 this morning. Reports increased intense lower abdominal pain since about 2000. Pain intensity is intermittent, but some pain constant. Reports "burning"with urination, increase in frequency, and difficulty staying hydrated. Reports good fetal movement. Denies leaking of fluid or vaginal bleeding. CNM to bedside to evaluate patient. Plan to collect urine sample and initiate efm. Pt up to bathroom at this time.

## 2020-03-17 NOTE — Discharge Instructions (Signed)
Drink water until urine is clear to light yellow. Limit sugar sweetened beverages.

## 2020-03-17 NOTE — Discharge Summary (Signed)
Physician Final Progress Note  Patient ID: Suzanne Nelson MRN: [id number] DOB/AGE: 25-21-1997 25 y.o.  Admit date: 03/17/2020 Admitting provider: Tresea Mall, CNM Discharge date: 03/17/2020   Admission Diagnoses: pelvic pain  Discharge Diagnoses:  Active Problems:   Labor and delivery, indication for care   Dysuria during pregnancy in third trimester   Pelvic pain affecting pregnancy in third trimester, antepartum   [redacted] weeks gestation of pregnancy Reactive NST  History of Present Illness: The patient is a 24 y.o. female G3P0020 at [redacted]w[redacted]d who presents for lower abdominal/pelvic pain that began early this morning. The pain has been constant with intermittent worsening. She has felt some abdominal tightening today. She admits difficulty with adequate hydration, mostly drinking soda and sweet tea. She reports burning and frequency of urination. She denies leakage of fluid and vaginal bleeding. She reports good fetal movement.    She was admitted for observation, placed on monitors, lab collected. Monitoring is reassuring. Urinalysis indicates dehydration and possible UTI. Patient is discharged to home with instructions and precautions. Will follow up with patient after urine culture results.   Past Medical History:  Diagnosis Date  . ADHD (attention deficit hyperactivity disorder)   . Anxiety   . Asthma   . Asthma   . Depression   . Migraine   . No pertinent past medical history   . Panic attack     Past Surgical History:  Procedure Laterality Date  . CYSTO N/A 05/19/2015   Procedure: CYSTO;  Surgeon: Vanna Scotland, MD;  Location: [facility] ORS;  Service: Urology;  Laterality: N/A;  . NO PAST SURGERIES    . STENT PLACE LEFT URETER ([facility] HX)    . URETEROSCOPY WITH HOLMIUM LASER LITHOTRIPSY Left 05/19/2015   Procedure: URETEROSCOPY WITH HOLMIUM LASER LITHOTRIPSY;  Surgeon: Vanna Scotland, MD;  Location: [facility] ORS;  Service: Urology;  Laterality: Left;    No current  facility-administered medications on file prior to encounter.   Current Outpatient Medications on File Prior to Encounter  Medication Sig Dispense Refill  . cyclobenzaprine (FLEXERIL) 10 MG tablet Take 1 tablet (10 mg total) by mouth every 8 (eight) hours as needed for muscle spasms. 30 tablet 1  . escitalopram (LEXAPRO) 10 MG tablet Take 10 mg by mouth daily.    . famotidine (PEPCID) 10 MG tablet Take 10 mg by mouth 2 (two) times daily.    . Prenatal Vit-Fe Fumarate-FA (MULTIVITAMIN-PRENATAL) 27-0.8 MG TABS tablet Take 1 tablet by mouth daily at 12 noon.    . nitrofurantoin, macrocrystal-monohydrate, (MACROBID) 100 MG capsule Take 1 capsule (100 mg total) by mouth 2 (two) times daily. (Patient not taking: Reported on 02/22/2020) 14 capsule 0  . promethazine (PHENERGAN) 6.25 MG/5ML syrup Take 20 mLs (25 mg total) by mouth every 6 (six) hours. (Patient not taking: Reported on 02/22/2020) 473 mL 11  . [DISCONTINUED] albuterol (PROVENTIL HFA;VENTOLIN HFA) 108 (90 Base) MCG/ACT inhaler Inhale 2 puffs into the lungs every 6 (six) hours as needed for wheezing or shortness of breath. 1 Inhaler 0    Allergies  Allergen Reactions  . Onion Swelling  . Adhesive [Tape] Other (See Comments)    Steri-Strips & Silk tape  . Tape Rash    Social History   Socioeconomic History  . Marital status: Single    Spouse name: Not on file  . Number of children: Not on file  . Years of education: Not on file  . Highest education level: Not on file  Occupational History  . Occupation:  student  Tobacco Use  . Smoking status: Current Every Day Smoker    Packs/day: 1.00    Types: Cigarettes  . Smokeless tobacco: Never Used  . Tobacco comment: quiting now  Vaping Use  . Vaping Use: Former  . Substances: CBD  Substance and Sexual Activity  . Alcohol use: Not Currently    Comment: socially  . Drug use: Not Currently    Frequency: 1.0 times per week    Types: Marijuana    Comment: Last use one month age,  uses vape with CBD currently  . Sexual activity: Not Currently    Partners: Male    Birth control/protection: None    Comment: Pt uses Depo shot had times 1 moth ago  Other Topics Concern  . Not on file  Social History Narrative   ** Merged History Encounter **       ** Merged History Encounter **       Lives at home, independant   Social Determinants of Health   Financial Resource Strain:   . Difficulty of Paying Living Expenses: Not on file  Food Insecurity:   . Worried About Programme researcher, broadcasting/film/video in the Last Year: Not on file  . Ran Out of Food in the Last Year: Not on file  Transportation Needs:   . Lack of Transportation (Medical): Not on file  . Lack of Transportation (Non-Medical): Not on file  Physical Activity:   . Days of Exercise per Week: Not on file  . Minutes of Exercise per Session: Not on file  Stress:   . Feeling of Stress : Not on file  Social Connections:   . Frequency of Communication with Friends and Family: Not on file  . Frequency of Social Gatherings with Friends and Family: Not on file  . Attends Religious Services: Not on file  . Active Member of Clubs or Organizations: Not on file  . Attends Banker Meetings: Not on file  . Marital Status: Not on file  Intimate Partner Violence:   . Fear of Current or Ex-Partner: Not on file  . Emotionally Abused: Not on file  . Physically Abused: Not on file  . Sexually Abused: Not on file    Family History  Problem Relation Age of Onset  . Diabetes Mellitus II Mother   . Bipolar disorder Mother   . Breast cancer Paternal Grandmother        not sure of age     Review of Systems  Constitutional: Negative for chills and fever.  HENT: Negative for congestion, ear discharge, ear pain, hearing loss, sinus pain and sore throat.   Eyes: Negative for blurred vision and double vision.  Respiratory: Negative for cough, shortness of breath and wheezing.   Cardiovascular: Negative for chest pain,  palpitations and leg swelling.  Gastrointestinal: Negative for abdominal pain, blood in stool, constipation, diarrhea, heartburn, melena, nausea and vomiting.  Genitourinary: Positive for dysuria and frequency. Negative for flank pain, hematuria and urgency.       Positive for pelvic pain  Musculoskeletal: Negative for back pain, joint pain and myalgias.  Skin: Negative for itching and rash.  Neurological: Negative for dizziness, tingling, tremors, sensory change, speech change, focal weakness, seizures, loss of consciousness, weakness and headaches.  Endo/Heme/Allergies: Negative for environmental allergies. Does not bruise/bleed easily.  Psychiatric/Behavioral: Negative for depression, hallucinations, memory loss, substance abuse and suicidal ideas. The patient is not nervous/anxious and does not have insomnia.      Physical Exam:  BP 125/67 (BP Location: Left Arm)   Pulse 94   Temp 97.9 F (36.6 C) (Oral)   Resp 18   Ht 5\' 2"  (1.575 m)   Wt 76.2 kg   LMP 07/10/2019 (Within Weeks)   BMI 30.73 kg/m   Constitutional: Well nourished, well developed female in no acute distress.  HEENT: normal Skin: Warm and dry.  Cardiovascular: Regular rate and rhythm.   Extremity: no edema  Respiratory: Clear to auscultation bilateral. Normal respiratory effort Abdomen: FHT present Back: no CVAT Neuro: DTRs 2+, Cranial nerves grossly intact Psych: Alert and Oriented x3. No memory deficits. Normal mood and affect.  MS: normal gait, normal bilateral lower extremity ROM/strength/stability.  Pelvic exam: deferred  Toco: negative for contractions Fetal well being: 135 bpm, moderate variability, +accelerations, -decelerations  Consults: None  Significant Findings/ Diagnostic Studies: labs:  Results for Suzanne Nelson, Suzanne Nelson (MRN [name]) as of 03/17/2020 23:04  Ref. Range 03/17/2020 21:34  Appearance Latest Ref Range: CLEAR  CLOUDY (A)  Bilirubin Urine Latest Ref Range: NEGATIVE  NEGATIVE  Color,  Urine Latest Ref Range: YELLOW  YELLOW (A)  Glucose, UA Latest Ref Range: NEGATIVE mg/dL NEGATIVE  Hgb urine dipstick Latest Ref Range: NEGATIVE  NEGATIVE  Ketones, ur Latest Ref Range: NEGATIVE mg/dL 5 (A)  Leukocytes,Ua Latest Ref Range: NEGATIVE  SMALL (A)  Nitrite Latest Ref Range: NEGATIVE  NEGATIVE  pH Latest Ref Range: 5.0 - 8.0  5.0  Protein Latest Ref Range: NEGATIVE mg/dL NEGATIVE  Specific Gravity, Urine Latest Ref Range: 1.005 - 1.030  1.024  Bacteria, UA Latest Ref Range: NONE SEEN  NONE SEEN  Ca Oxalate Crys, UA Unknown PRESENT  Mucus Unknown PRESENT  RBC / HPF Latest Ref Range: 0 - 5 RBC/hpf 0-5  Squamous Epithelial / LPF Latest Ref Range: 0 - 5  6-10  WBC, UA Latest Ref Range: 0 - 5 WBC/hpf 21-50    Procedures: NST  Hospital Course: The patient was admitted to Labor and Delivery Triage for observation.   Discharge Condition: good  Disposition: Discharge disposition: 01-Home or Self Care  Diet: Regular diet  Discharge Activity: Activity as tolerated  Discharge Instructions    Discharge activity:  No Restrictions   Complete by: As directed    Discharge diet:  No restrictions   Complete by: As directed    No sexual activity restrictions   Complete by: As directed    Notify physician for a general feeling that "something is not right"   Complete by: As directed    Notify physician for increase or change in vaginal discharge   Complete by: As directed    Notify physician for intestinal cramps, with or without diarrhea, sometimes described as "gas pain"   Complete by: As directed    Notify physician for leaking of fluid   Complete by: As directed    Notify physician for low, dull backache, unrelieved by heat or Tylenol   Complete by: As directed    Notify physician for menstrual like cramps   Complete by: As directed    Notify physician for pelvic pressure   Complete by: As directed    Notify physician for uterine contractions.  These may be painless and  feel like the uterus is tightening or the baby is  "balling up"   Complete by: As directed    Notify physician for vaginal bleeding   Complete by: As directed    PRETERM LABOR:  Includes any of the follwing symptoms that occur between 20 -  [redacted] weeks gestation.  If these symptoms are not stopped, preterm labor can result in preterm delivery, placing your baby at risk   Complete by: As directed      Allergies as of 03/17/2020      Reactions   Onion Swelling   Adhesive [tape] Other (See Comments)   Steri-Strips & Silk tape   Tape Rash      Medication List    STOP taking these medications   nitrofurantoin (macrocrystal-monohydrate) 100 MG capsule Commonly known as: MACROBID   promethazine 6.25 MG/5ML syrup Commonly known as: PHENERGAN     TAKE these medications   cyclobenzaprine 10 MG tablet Commonly known as: FLEXERIL Take 1 tablet (10 mg total) by mouth every 8 (eight) hours as needed for muscle spasms.   escitalopram 10 MG tablet Commonly known as: LEXAPRO Take 10 mg by mouth daily.   famotidine 10 MG tablet Commonly known as: PEPCID Take 10 mg by mouth 2 (two) times daily.   multivitamin-prenatal 27-0.8 MG Tabs tablet Take 1 tablet by mouth daily at 12 noon.       Follow-up Information    [facility]. Go to.   Specialty: Obstetrics and Gynecology Why: scheduled prenatal appointment Contact information: [address] [place] [phone]              Total time spent taking care of this patient: 25 minutes  Signed: Tresea MallJane Ivis Henneman, CNM  03/17/2020, 10:55 PM

## 2020-03-19 LAB — URINE CULTURE

## 2020-03-20 ENCOUNTER — Encounter: Payer: Self-pay | Admitting: Obstetrics and Gynecology

## 2020-03-20 ENCOUNTER — Ambulatory Visit (INDEPENDENT_AMBULATORY_CARE_PROVIDER_SITE_OTHER): Payer: Medicaid Other | Admitting: Obstetrics and Gynecology

## 2020-03-20 ENCOUNTER — Other Ambulatory Visit: Payer: Self-pay

## 2020-03-20 VITALS — BP 108/70 | Wt 175.2 lb

## 2020-03-20 DIAGNOSIS — Z3480 Encounter for supervision of other normal pregnancy, unspecified trimester: Secondary | ICD-10-CM

## 2020-03-20 DIAGNOSIS — Z23 Encounter for immunization: Secondary | ICD-10-CM

## 2020-03-20 DIAGNOSIS — O2343 Unspecified infection of urinary tract in pregnancy, third trimester: Secondary | ICD-10-CM

## 2020-03-20 MED ORDER — NITROFURANTOIN MONOHYD MACRO 100 MG PO CAPS: 100.0000 mg | ORAL_CAPSULE | Freq: Two times a day (BID) | ORAL | 1 refills | Status: DC

## 2020-03-20 NOTE — Patient Instructions (Signed)
Third Trimester of Pregnancy The third trimester is from week 28 through week 40 (months 7 through 9). The third trimester is a time when the unborn baby (fetus) is growing rapidly. At the end of the ninth month, the fetus is about 20 inches in length and weighs 6-10 pounds. Body changes during your third trimester Your body will continue to go through many changes during pregnancy. The changes vary from woman to woman. During the third trimester:  Your weight will continue to increase. You can expect to gain 25-35 pounds (11-16 kg) by the end of the pregnancy.  You may begin to get stretch marks on your hips, abdomen, and breasts.  You may urinate more often because the fetus is moving lower into your pelvis and pressing on your bladder.  You may develop or continue to have heartburn. This is caused by increased hormones that slow down muscles in the digestive tract.  You may develop or continue to have constipation because increased hormones slow digestion and cause the muscles that push waste through your intestines to relax.  You may develop hemorrhoids. These are swollen veins (varicose veins) in the rectum that can itch or be painful.  You may develop swollen, bulging veins (varicose veins) in your legs.  You may have increased body aches in the pelvis, back, or thighs. This is due to weight gain and increased hormones that are relaxing your joints.  You may have changes in your hair. These can include thickening of your hair, rapid growth, and changes in texture. Some women also have hair loss during or after pregnancy, or hair that feels dry or thin. Your hair will most likely return to normal after your baby is born.  Your breasts will continue to grow and they will continue to become tender. A yellow fluid (colostrum) may leak from your breasts. This is the first milk you are producing for your baby.  Your belly button may stick out.  You may notice more swelling in your hands,  face, or ankles.  You may have increased tingling or numbness in your hands, arms, and legs. The skin on your belly may also feel numb.  You may feel short of breath because of your expanding uterus.  You may have more problems sleeping. This can be caused by the size of your belly, increased need to urinate, and an increase in your body's metabolism.  You may notice the fetus "dropping," or moving lower in your abdomen (lightening).  You may have increased vaginal discharge.  You may notice your joints feel loose and you may have pain around your pelvic bone. What to expect at prenatal visits You will have prenatal exams every 2 weeks until week 36. Then you will have weekly prenatal exams. During a routine prenatal visit:  You will be weighed to make sure you and the baby are growing normally.  Your blood pressure will be taken.  Your abdomen will be measured to track your baby's growth.  The fetal heartbeat will be listened to.  Any test results from the previous visit will be discussed.  You may have a cervical check near your due date to see if your cervix has softened or thinned (effaced).  You will be tested for Group B streptococcus. This happens between 35 and 37 weeks. Your health care provider may ask you:  What your birth plan is.  How you are feeling.  If you are feeling the baby move.  If you have had any abnormal   symptoms, such as leaking fluid, bleeding, severe headaches, or abdominal cramping.  If you are using any tobacco products, including cigarettes, chewing tobacco, and electronic cigarettes.  If you have any questions. Other tests or screenings that may be performed during your third trimester include:  Blood tests that check for low iron levels (anemia).  Fetal testing to check the health, activity level, and growth of the fetus. Testing is done if you have certain medical conditions or if there are problems during the pregnancy.  Nonstress test  (NST). This test checks the health of your baby to make sure there are no signs of problems, such as the baby not getting enough oxygen. During this test, a belt is placed around your belly. The baby is made to move, and its heart rate is monitored during movement. What is false labor? False labor is a condition in which you feel small, irregular tightenings of the muscles in the womb (contractions) that usually go away with rest, changing position, or drinking water. These are called Braxton Hicks contractions. Contractions may last for hours, days, or even weeks before true labor sets in. If contractions come at regular intervals, become more frequent, increase in intensity, or become painful, you should see your health care provider. What are the signs of labor?  Abdominal cramps.  Regular contractions that start at 10 minutes apart and become stronger and more frequent with time.  Contractions that start on the top of the uterus and spread down to the lower abdomen and back.  Increased pelvic pressure and dull back pain.  A watery or bloody mucus discharge that comes from the vagina.  Leaking of amniotic fluid. This is also known as your "water breaking." It could be a slow trickle or a gush. Let your health care provider know if it has a color or strange odor. If you have any of these signs, call your health care provider right away, even if it is before your due date. Follow these instructions at home: Medicines  Follow your health care provider's instructions regarding medicine use. Specific medicines may be either safe or unsafe to take during pregnancy.  Take a prenatal vitamin that contains at least 600 micrograms (mcg) of folic acid.  If you develop constipation, try taking a stool softener if your health care provider approves. Eating and drinking   Eat a balanced diet that includes fresh fruits and vegetables, whole grains, good sources of protein such as meat, eggs, or tofu,  and low-fat dairy. Your health care provider will help you determine the amount of weight gain that is right for you.  Avoid raw meat and uncooked cheese. These carry germs that can cause birth defects in the baby.  If you have low calcium intake from food, talk to your health care provider about whether you should take a daily calcium supplement.  Eat four or five small meals rather than three large meals a day.  Limit foods that are high in fat and processed sugars, such as fried and sweet foods.  To prevent constipation: ? Drink enough fluid to keep your urine clear or pale yellow. ? Eat foods that are high in fiber, such as fresh fruits and vegetables, whole grains, and beans. Activity  Exercise only as directed by your health care provider. Most women can continue their usual exercise routine during pregnancy. Try to exercise for 30 minutes at least 5 days a week. Stop exercising if you experience uterine contractions.  Avoid heavy lifting.  Do   not exercise in extreme heat or humidity, or at high altitudes.  Wear low-heel, comfortable shoes.  Practice good posture.  You may continue to have sex unless your health care provider tells you otherwise. Relieving pain and discomfort  Take frequent breaks and rest with your legs elevated if you have leg cramps or low back pain.  Take warm sitz baths to soothe any pain or discomfort caused by hemorrhoids. Use hemorrhoid cream if your health care provider approves.  Wear a good support bra to prevent discomfort from breast tenderness.  If you develop varicose veins: ? Wear support pantyhose or compression stockings as told by your healthcare provider. ? Elevate your feet for 15 minutes, 3-4 times a day. Prenatal care  Write down your questions. Take them to your prenatal visits.  Keep all your prenatal visits as told by your health care provider. This is important. Safety  Wear your seat belt at all times when driving.  Make  a list of emergency phone numbers, including numbers for family, friends, the hospital, and police and fire departments. General instructions  Avoid cat litter boxes and soil used by cats. These carry germs that can cause birth defects in the baby. If you have a cat, ask someone to clean the litter box for you.  Do not travel far distances unless it is absolutely necessary and only with the approval of your health care provider.  Do not use hot tubs, steam rooms, or saunas.  Do not drink alcohol.  Do not use any products that contain nicotine or tobacco, such as cigarettes and e-cigarettes. If you need help quitting, ask your health care provider.  Do not use any medicinal herbs or unprescribed drugs. These chemicals affect the formation and growth of the baby.  Do not douche or use tampons or scented sanitary pads.  Do not cross your legs for long periods of time.  To prepare for the arrival of your baby: ? Take prenatal classes to understand, practice, and ask questions about labor and delivery. ? Make a trial run to the hospital. ? Visit the hospital and tour the maternity area. ? Arrange for maternity or paternity leave through employers. ? Arrange for family and friends to take care of pets while you are in the hospital. ? Purchase a rear-facing car seat and make sure you know how to install it in your car. ? Pack your hospital bag. ? Prepare the baby's nursery. Make sure to remove all pillows and stuffed animals from the baby's crib to prevent suffocation.  Visit your dentist if you have not gone during your pregnancy. Use a soft toothbrush to brush your teeth and be gentle when you floss. Contact a health care provider if:  You are unsure if you are in labor or if your water has broken.  You become dizzy.  You have mild pelvic cramps, pelvic pressure, or nagging pain in your abdominal area.  You have lower back pain.  You have persistent nausea, vomiting, or  diarrhea.  You have an unusual or bad smelling vaginal discharge.  You have pain when you urinate. Get help right away if:  Your water breaks before 37 weeks.  You have regular contractions less than 5 minutes apart before 37 weeks.  You have a fever.  You are leaking fluid from your vagina.  You have spotting or bleeding from your vagina.  You have severe abdominal pain or cramping.  You have rapid weight loss or weight gain.  You have   shortness of breath with chest pain.  You notice sudden or extreme swelling of your face, hands, ankles, feet, or legs.  Your baby makes fewer than 10 movements in 2 hours.  You have severe headaches that do not go away when you take medicine.  You have vision changes. Summary  The third trimester is from week 28 through week 40, months 7 through 9. The third trimester is a time when the unborn baby (fetus) is growing rapidly.  During the third trimester, your discomfort may increase as you and your baby continue to gain weight. You may have abdominal, leg, and back pain, sleeping problems, and an increased need to urinate.  During the third trimester your breasts will keep growing and they will continue to become tender. A yellow fluid (colostrum) may leak from your breasts. This is the first milk you are producing for your baby.  False labor is a condition in which you feel small, irregular tightenings of the muscles in the womb (contractions) that eventually go away. These are called Braxton Hicks contractions. Contractions may last for hours, days, or even weeks before true labor sets in.  Signs of labor can include: abdominal cramps; regular contractions that start at 10 minutes apart and become stronger and more frequent with time; watery or bloody mucus discharge that comes from the vagina; increased pelvic pressure and dull back pain; and leaking of amniotic fluid. This information is not intended to replace advice given to you by your  health care provider. Make sure you discuss any questions you have with your health care provider. Document Revised: 07/27/2018 Document Reviewed: 05/11/2016 Elsevier Patient Education  2020 [employer]  

## 2020-03-20 NOTE — Progress Notes (Signed)
TROB [redacted]W[redacted]D

## 2020-03-20 NOTE — Progress Notes (Signed)
Routine Prenatal Care Visit  Subjective  Suzanne Nelson is a 24 y.o. G3P0020 at [redacted]w[redacted]d being seen today for ongoing prenatal care.  She is currently monitored for the following issues for this low-risk pregnancy and has Major depressive disorder, single episode, moderate (HCC); Attention deficit hyperactivity disorder, combined type; Oppositional defiant disorder; Hydronephrosis; Calculus of distal left ureter; Left ureteral stone; Supervision of other normal pregnancy, antepartum; BV (bacterial vaginosis); Labor and delivery, indication for care; Dysuria during pregnancy in third trimester; Pelvic pain affecting pregnancy in third trimester, antepartum; and [redacted] weeks gestation of pregnancy on their problem list.  ----------------------------------------------------------------------------------- Patient reports crampy lower pelvic pain and sometimes cramps in both legs behind her knee caps at night.   Contractions: Not present. Vag. Bleeding: None.  Movement: Present. Denies leaking of fluid.  ----------------------------------------------------------------------------------- The following portions of the patient's history were reviewed and updated as appropriate: allergies, current medications, past family history, past medical history, past social history, past surgical history and problem list. Problem list updated.   Objective  Blood pressure 108/70, weight 175 lb 3.2 oz (79.5 kg), last menstrual period 07/10/2019. Pregravid weight 130 lb (59 kg) Total Weight Gain 45 lb 3.2 oz (20.5 kg) Urinalysis:      Fetal Status: Fetal Heart Rate (bpm): 150 Fundal Height: 32 cm Movement: Present     General:  Alert, oriented and cooperative. Patient is in no acute distress.  Skin: Skin is warm and dry. No rash noted.   Cardiovascular: Normal heart rate noted  Respiratory: Normal respiratory effort, no problems with respiration noted  Abdomen: Soft, gravid, appropriate for gestational age.  Pain/Pressure: Present     Pelvic:  Cervical exam deferred        Extremities: Normal range of motion.     Mental Status: Normal mood and affect. Normal behavior. Normal judgment and thought content.     Assessment   24 y.o. G3P0020 at [redacted]w[redacted]d by  05/23/2020, Date entered prior to episode creation presenting for routine prenatal visit  Plan   THIRD Problems (from 10/17/19 to present)    Problem Noted Resolved   Supervision of other normal pregnancy, antepartum 10/17/2019 by Mirna Mires, CNM No   Overview Addendum 03/20/2020  2:24 PM by Natale Milch, MD     Nursing Staff Provider  Office Location  [facility] Dating   6 wk [place]  Language  English Anatomy US  normal  Flu Vaccine  Declined Genetic Screen  NIPS: normal xx   TDaP vaccine   03/20/2020 Hgb A1C or  GTT Third trimester : 106  Rhogam   not needed   LAB RESULTS   Feeding Plan Pumping, desires to mostly bottle feed/ formula Blood Type --/--/O POS Performed at [facility], [address] [place], [place] [address]  [phone])   Contraception  Antibody    Circumcision  Rubella    Pediatrician   RPR NON REACTIVE (06/11 0918)   Support Person  HBsAg     Prenatal Classes  HIV Non Reactive (06/11 0918)    Varicella    BTL Consent  GBS  (For PCN allergy, check sensitivities)        VBAC Consent  Pap   2021 NIL     Hgb Electro   not needed  Covid Declines CF - carrier     SMA  normal               Previous Version  Will start on macrobid given multiple species seen on recent urine culture,  Will resent Urine for culture Encouraged magnesium supplement of bananas for leg cramps along with hydration TDAP today   Gestational age appropriate obstetric precautions including but not limited to vaginal bleeding, contractions, leaking of fluid and fetal movement were reviewed in detail with the patient.    Return in about 2 weeks (around 04/03/2020) for ROB in person.  Natale Milch  MD [facility], [facility] 03/20/2020, 2:24 PM

## 2020-03-22 LAB — URINE CULTURE

## 2020-03-27 NOTE — Telephone Encounter (Signed)
Is there anything you can send in for her?

## 2020-03-28 ENCOUNTER — Other Ambulatory Visit: Payer: Self-pay

## 2020-03-28 ENCOUNTER — Encounter: Payer: Self-pay | Admitting: Obstetrics and Gynecology

## 2020-03-28 ENCOUNTER — Ambulatory Visit (INDEPENDENT_AMBULATORY_CARE_PROVIDER_SITE_OTHER): Payer: Medicaid Other | Admitting: Obstetrics and Gynecology

## 2020-03-28 VITALS — BP 98/56 | Wt 182.0 lb

## 2020-03-28 DIAGNOSIS — Z3A32 32 weeks gestation of pregnancy: Secondary | ICD-10-CM

## 2020-03-28 DIAGNOSIS — R519 Headache, unspecified: Secondary | ICD-10-CM

## 2020-03-28 DIAGNOSIS — O26893 Other specified pregnancy related conditions, third trimester: Secondary | ICD-10-CM | POA: Insufficient documentation

## 2020-03-28 LAB — POCT URINALYSIS DIPSTICK OB
Glucose, UA: NEGATIVE
POC,PROTEIN,UA: NEGATIVE

## 2020-03-28 MED ORDER — BUTALBITAL-APAP-CAFFEINE 50-325-40 MG PO CAPS: 1.0000 | ORAL_CAPSULE | Freq: Four times a day (QID) | ORAL | 0 refills | Status: DC | PRN

## 2020-03-28 NOTE — Telephone Encounter (Signed)
Pt scheduled for this am.

## 2020-03-28 NOTE — Telephone Encounter (Signed)
Could she come in for a BP check? Thanks

## 2020-03-28 NOTE — Progress Notes (Signed)
Routine Prenatal Care Visit  Subjective  Suzanne Nelson is a 24 y.o. G3P0020 at [redacted]w[redacted]d being seen today for ongoing prenatal care.  She is currently monitored for the following issues for this low-risk pregnancy and has Major depressive disorder, single episode, moderate (HCC); Attention deficit hyperactivity disorder, combined type; Oppositional defiant disorder; Hydronephrosis; Calculus of distal left ureter; Left ureteral stone; Supervision of other normal pregnancy, antepartum; BV (bacterial vaginosis); Labor and delivery, indication for care; Dysuria during pregnancy in third trimester; Pelvic pain affecting pregnancy in third trimester, antepartum; [redacted] weeks gestation of pregnancy; and Headache in pregnancy, antepartum, third trimester on their problem list.  ----------------------------------------------------------------------------------- Patient reports severe headache, rating pain 7/10, that has been present for the last 4 days. Patient has attempted using tylenol every 6 hours to treat headache, with no effect. Patient reports adequate hydration and oral intake. Patient denies additional S&S of PIH including vision changes or RUQ pain.    Contractions: Not present. Vag. Bleeding: None.  Movement: Present. Denies leaking of fluid.  ----------------------------------------------------------------------------------- The following portions of the patient's history were reviewed and updated as appropriate: allergies, current medications, past family history, past medical history, past social history, past surgical history and problem list. Problem list updated.   Objective  Blood pressure (!) 98/56, weight 182 lb (82.6 kg), last menstrual period 07/10/2019. Pregravid weight 130 lb (59 kg) Total Weight Gain 52 lb (23.6 kg) Urinalysis:      Fetal Status: Fetal Heart Rate (bpm): 145 Fundal Height: 33 cm Movement: Present     General:  Alert, oriented and cooperative. Patient is in no  acute distress.  Skin: Skin is warm and dry. No rash noted.   Cardiovascular: Normal heart rate noted  Respiratory: Normal respiratory effort, no problems with respiration noted  Abdomen: Soft, gravid, appropriate for gestational age. Pain/Pressure: Present     Pelvic:  Cervical exam deferred        Extremities: Normal range of motion.     ental Status: Normal mood and affect. Normal behavior. Normal judgment and thought content.     Assessment   24 y.o. G3P0020 at [redacted]w[redacted]d by  05/23/2020,  presenting for work-in prenatal visit - headache in pregnancy  Plan   THIRD Problems (from 10/17/19 to present)    Problem Noted Resolved   Headache in pregnancy, antepartum, third trimester 03/28/2020 by Zipporah Plants, CNM No   Supervision of other normal pregnancy, antepartum 10/17/2019 by Mirna Mires, CNM No   Overview Addendum 03/20/2020  2:24 PM by Natale Milch, MD     Nursing Staff Provider  Office Location  [facility] Dating   6 wk [place]  Language  English Anatomy US  normal  Flu Vaccine  Declined Genetic Screen  NIPS: normal xx   TDaP vaccine   03/20/2020 Hgb A1C or  GTT Third trimester : 106  Rhogam   not needed   LAB RESULTS   Feeding Plan Pumping, desires to mostly bottle feed/ formula Blood Type --/--/O POS Performed at [facility], [address] [place], [place] [address]  [phone])   Contraception  Antibody    Circumcision  Rubella    Pediatrician   RPR NON REACTIVE (06/11 0918)   Support Person  HBsAg     Prenatal Classes  HIV Non Reactive (06/11 3710)    Varicella    BTL Consent  GBS  (For PCN allergy, check sensitivities)        VBAC Consent  Pap  2021 NIL     Hgb Electro   not needed  Covid Declines CF - carrier     SMA  normal               Previous Version       -BP wnl - Fioricet prn for headache - f/u if sx persist  Gestational age appropriate obstetric precautions including but not limited to vaginal bleeding, contractions,  leaking of fluid and fetal movement were reviewed in detail with the patient.    Follow-up as needed if symptoms persist or for previously schedule ROB.  Zipporah Plants, CNM, MSN [facility], [facility] 03/28/2020, 10:49 AM

## 2020-03-29 ENCOUNTER — Inpatient Hospital Stay
Admission: EM | Admit: 2020-03-29 | Discharge: 2020-03-29 | Disposition: A | Discharge status: Home / Self Care | Payer: Medicaid Other | Attending: Obstetrics and Gynecology | Admitting: Obstetrics and Gynecology

## 2020-03-29 ENCOUNTER — Encounter: Payer: Self-pay | Admitting: Obstetrics and Gynecology

## 2020-03-29 ENCOUNTER — Other Ambulatory Visit: Payer: Self-pay

## 2020-03-29 DIAGNOSIS — O99353 Diseases of the nervous system complicating pregnancy, third trimester: Secondary | ICD-10-CM | POA: Insufficient documentation

## 2020-03-29 DIAGNOSIS — O99613 Diseases of the digestive system complicating pregnancy, third trimester: Secondary | ICD-10-CM | POA: Diagnosis not present

## 2020-03-29 DIAGNOSIS — Z20822 Contact with and (suspected) exposure to covid-19: Secondary | ICD-10-CM | POA: Diagnosis not present

## 2020-03-29 DIAGNOSIS — G43909 Migraine, unspecified, not intractable, without status migrainosus: Secondary | ICD-10-CM | POA: Diagnosis not present

## 2020-03-29 DIAGNOSIS — Z3A32 32 weeks gestation of pregnancy: Secondary | ICD-10-CM | POA: Diagnosis not present

## 2020-03-29 DIAGNOSIS — O219 Vomiting of pregnancy, unspecified: Secondary | ICD-10-CM | POA: Diagnosis not present

## 2020-03-29 DIAGNOSIS — Z3480 Encounter for supervision of other normal pregnancy, unspecified trimester: Secondary | ICD-10-CM

## 2020-03-29 DIAGNOSIS — O26893 Other specified pregnancy related conditions, third trimester: Secondary | ICD-10-CM

## 2020-03-29 DIAGNOSIS — R197 Diarrhea, unspecified: Secondary | ICD-10-CM

## 2020-03-29 DIAGNOSIS — G43019 Migraine without aura, intractable, without status migrainosus: Secondary | ICD-10-CM | POA: Diagnosis not present

## 2020-03-29 DIAGNOSIS — O99891 Other specified diseases and conditions complicating pregnancy: Secondary | ICD-10-CM

## 2020-03-29 LAB — URINALYSIS, COMPLETE (UACMP) WITH MICROSCOPIC
Bilirubin Urine: NEGATIVE
Glucose, UA: NEGATIVE mg/dL
Hgb urine dipstick: NEGATIVE
Ketones, ur: NEGATIVE mg/dL
Nitrite: NEGATIVE
Protein, ur: NEGATIVE mg/dL
Specific Gravity, Urine: 1.001 — ABNORMAL LOW (ref 1.005–1.030)
pH: 7 (ref 5.0–8.0)

## 2020-03-29 LAB — COMPREHENSIVE METABOLIC PANEL
ALT: 12 U/L (ref 0–44)
AST: 19 U/L (ref 15–41)
Albumin: 2.9 g/dL — ABNORMAL LOW (ref 3.5–5.0)
Alkaline Phosphatase: 117 U/L (ref 38–126)
Anion gap: 10 (ref 5–15)
BUN: 7 mg/dL (ref 6–20)
CO2: 22 mmol/L (ref 22–32)
Calcium: 9.1 mg/dL (ref 8.9–10.3)
Chloride: 103 mmol/L (ref 98–111)
Creatinine, Ser: 0.44 mg/dL (ref 0.44–1.00)
GFR, Estimated: >60 mL/min (ref >60)
Glucose, Bld: 94 mg/dL (ref 70–99)
Potassium: 3.7 mmol/L (ref 3.5–5.1)
Sodium: 135 mmol/L (ref 135–145)
Total Bilirubin: 0.5 mg/dL (ref 0.3–1.2)
Total Protein: 6.6 g/dL (ref 6.5–8.1)

## 2020-03-29 LAB — CBC
HCT: 32.9 % — ABNORMAL LOW (ref 36.0–46.0)
Hemoglobin: 11.2 g/dL — ABNORMAL LOW (ref 12.0–15.0)
MCH: 30.4 pg (ref 26.0–34.0)
MCHC: 34 g/dL (ref 30.0–36.0)
MCV: 89.4 fL (ref 80.0–100.0)
Platelets: 266 10*3/uL (ref 150–400)
RBC: 3.68 MIL/uL — ABNORMAL LOW (ref 3.87–5.11)
RDW: 12.2 % (ref 11.5–15.5)
WBC: 21.3 10*3/uL — ABNORMAL HIGH (ref 4.0–10.5)
nRBC: 0 % (ref 0.0–0.2)

## 2020-03-29 LAB — RESP PANEL BY RT-PCR (FLU A&B, COVID) ARPGX2
Influenza A by PCR: NEGATIVE
Influenza B by PCR: NEGATIVE
SARS Coronavirus 2 by RT PCR: NEGATIVE

## 2020-03-29 MED ORDER — LACTATED RINGERS IV SOLN: INTRAVENOUS | Status: DC

## 2020-03-29 MED ORDER — DIPHENHYDRAMINE HCL 25 MG PO CAPS
50.0000 mg | ORAL_CAPSULE | Freq: Four times a day (QID) | ORAL | Status: DC | PRN
  Administered 2020-03-29: 16:00:00 50 mg via ORAL
  Filled 2020-03-29: qty 2

## 2020-03-29 MED ORDER — ONDANSETRON HCL 4 MG PO TABS: 4.0000 mg | ORAL_TABLET | Freq: Four times a day (QID) | ORAL | 0 refills | Status: DC | PRN

## 2020-03-29 MED ORDER — ONDANSETRON HCL 4 MG/2ML IJ SOLN
INTRAMUSCULAR | Status: AC
  Filled 2020-03-29: qty 2

## 2020-03-29 MED ORDER — ONDANSETRON 4 MG PO TBDP
ORAL_TABLET | ORAL | Status: AC
  Filled 2020-03-29: qty 1

## 2020-03-29 MED ORDER — LACTATED RINGERS IV BOLUS
1000.0000 mL | Freq: Once | INTRAVENOUS | Status: AC
  Administered 2020-03-29: 15:00:00 1000 mL via INTRAVENOUS

## 2020-03-29 MED ORDER — ONDANSETRON HCL 4 MG/2ML IJ SOLN
4.0000 mg | Freq: Four times a day (QID) | INTRAMUSCULAR | Status: DC | PRN
  Administered 2020-03-29: 15:00:00 4 mg via INTRAVENOUS

## 2020-03-29 MED ORDER — METOCLOPRAMIDE HCL 5 MG/ML IJ SOLN
10.0000 mg | Freq: Four times a day (QID) | INTRAMUSCULAR | Status: DC | PRN
  Administered 2020-03-29: 15:00:00 10 mg via INTRAVENOUS
  Filled 2020-03-29 (×2): qty 2

## 2020-03-29 MED ORDER — LOPERAMIDE HCL 2 MG PO CAPS
2.0000 mg | ORAL_CAPSULE | ORAL | Status: DC | PRN
  Administered 2020-03-29: 16:00:00 2 mg via ORAL
  Filled 2020-03-29 (×3): qty 1

## 2020-03-29 NOTE — OB Triage Note (Signed)
RN called MD with results for lab values. MD spoke to patient via phone about results and to discuss plan for discharge. Patient reports feeling good for discharge and expresses eagerness to get home. Work note given to patient and discharge instructions reviewed by RN and MD. Patient discharged home with support person.

## 2020-03-29 NOTE — Discharge Summary (Signed)
Physician Discharge Summary  Patient ID: Suzanne Nelson MRN: [id number] DOB/AGE: 13-Jun-1995 24 y.o.  Admit date: 03/29/2020 Discharge date: 03/29/2020  Admission Diagnoses:  Migraine, intractable Nausea in pregnancy Diarrhea [redacted] week pregnant Supervision normal other pregnancy  Discharge Diagnoses:  Migraine, intractable Nausea in pregnancy Diarrhea [redacted] week pregnant Supervision normal other pregnancy  Discharged Condition: good  Hospital Course: Patient was admitted for migraine, nausea, and diarrhea. She reported normal fetal movements. She denies contractions. She denies leakage of fluid. She denies vaginal bleeding. She has had a migraine headache for 5 days. She has taken Fioricet and tylenol which have not resolved the headache.  She reports multiple episodes today of liquid stool and nausea. She was given a bolus of IV fluids. She was given IV zofran, benadryl, and reglan. She was given imodium. Labs were largely WNL other than elevated WBC count.  She felt better with treatment and reported that her headache resolved. She was able to be discharged home in stable condition  NST: 140 bpm baseline, moderate variability, 15x15 accelerations, no decelerations.  Consults: None  Significant Diagnostic Studies: labs: see EPIC  Treatments: IV hydration  Discharge Exam: Blood pressure 115/67, pulse (!) 110, temperature 97.8 F (36.6 C), temperature source Oral, resp. rate 16, height 5\' 2"  (1.575 m), weight 82.6 kg, last menstrual period 07/10/2019. General appearance: alert and cooperative Resp: clear to auscultation bilaterally Cardio: regular rate and rhythm, S1, S2 normal, no murmur, click, rub or gallop GI: soft, non-tender; bowel sounds normal; no masses,  no organomegaly Extremities: extremities normal, atraumatic, no cyanosis or edema Skin: Skin color, texture, turgor normal. No rashes or lesions  Disposition: Discharge disposition: 01-Home or Self  Care       Allergies as of 03/29/2020      Reactions   Onion Swelling   Adhesive [tape] Other (See Comments)   Steri-Strips & Silk tape   Tape Rash      Medication List    TAKE these medications   Butalbital-APAP-Caffeine 50-325-40 MG capsule Take 1-2 capsules by mouth every 6 (six) hours as needed for headache.   cyclobenzaprine 10 MG tablet Commonly known as: FLEXERIL Take 1 tablet (10 mg total) by mouth every 8 (eight) hours as needed for muscle spasms.   escitalopram 10 MG tablet Commonly known as: LEXAPRO Take 10 mg by mouth daily.   famotidine 10 MG tablet Commonly known as: PEPCID Take 10 mg by mouth 2 (two) times daily.   multivitamin-prenatal 27-0.8 MG Tabs tablet Take 1 tablet by mouth daily at 12 noon.   nitrofurantoin (macrocrystal-monohydrate) 100 MG capsule Commonly known as: MACROBID Take 1 capsule (100 mg total) by mouth 2 (two) times daily.   ondansetron 4 MG tablet Commonly known as: Zofran Take 1 tablet (4 mg total) by mouth every 6 (six) hours as needed for nausea or vomiting.        Signed: 14/02/2020 03/29/2020, 5:25 PM

## 2020-03-29 NOTE — OB Triage Note (Signed)
Patient G3P0 [redacted]w[redacted]d presents to L&D with complaints of migraine, vomiting and diarrhea. Patient reports headache 5 days ago and states that it has been a 7/10 pain for the past 5 days. Patient reports being seen in the office yesterday and was told to follow up if symptoms persisted. Patient states vomiting around 0540 this morning. She reports eating a waffle at 3 am and has not been able to keep anything down since then. She reports having diarrhea as well since this morning. Patient denies contractions, vaginal bleeding, and leaking of fluid. MD aware of pt arrival and RN assessment. Patient reports not being around anyone with COVID and states she has no other symptoms. Patient educated that she has 3 or more symptoms of COVID so we would test her per Jerene Pitch, MD verbal order. Precautions were initiated and PPE was worn by RN in room.

## 2020-03-31 NOTE — Telephone Encounter (Signed)
Can you ask Suzanne Nelson about her, it looks like she just seen her in the ER

## 2020-04-03 ENCOUNTER — Ambulatory Visit (INDEPENDENT_AMBULATORY_CARE_PROVIDER_SITE_OTHER): Payer: Medicaid Other | Admitting: Obstetrics and Gynecology

## 2020-04-03 ENCOUNTER — Other Ambulatory Visit: Payer: Self-pay

## 2020-04-03 ENCOUNTER — Encounter: Payer: Self-pay | Admitting: Obstetrics and Gynecology

## 2020-04-03 VITALS — BP 120/80 | Wt 182.0 lb

## 2020-04-03 DIAGNOSIS — O99213 Obesity complicating pregnancy, third trimester: Secondary | ICD-10-CM

## 2020-04-03 DIAGNOSIS — Z3480 Encounter for supervision of other normal pregnancy, unspecified trimester: Secondary | ICD-10-CM

## 2020-04-03 DIAGNOSIS — Z3A32 32 weeks gestation of pregnancy: Secondary | ICD-10-CM

## 2020-04-03 NOTE — Progress Notes (Signed)
Routine Prenatal Care Visit  Subjective  Suzanne Nelson is a 24 y.o. G3P0020 at [redacted]w[redacted]d being seen today for ongoing prenatal care.  She is currently monitored for the following issues for this low-risk pregnancy and has Major depressive disorder, single episode, moderate (HCC); Attention deficit hyperactivity disorder, combined type; Oppositional defiant disorder; Hydronephrosis; Calculus of distal left ureter; Left ureteral stone; Supervision of other normal pregnancy, antepartum; BV (bacterial vaginosis); Labor and delivery, indication for care; Dysuria during pregnancy in third trimester; Pelvic pain affecting pregnancy in third trimester, antepartum; [redacted] weeks gestation of pregnancy; and Headache in pregnancy, antepartum, third trimester on their problem list.  ----------------------------------------------------------------------------------- Patient reports increased stress level due to father's incarceration. Patient also reports noticing new, small lump on LLE, just above her knee. Patient reports family hx of blood clots in pregnancy (her mother) - is concerned lump may be clot. Contractions: Not present. Vag. Bleeding: None.  Movement: Present. Denies leaking of fluid.  ----------------------------------------------------------------------------------- The following portions of the patient's history were reviewed and updated as appropriate: allergies, current medications, past family history, past medical history, past social history, past surgical history and problem list. Problem list updated.   Objective  Blood pressure 120/80, weight 182 lb (82.6 kg), last menstrual period 07/10/2019. Pregravid weight 130 lb (59 kg) Total Weight Gain 52 lb (23.6 kg) Urinalysis:      Fetal Status: Fetal Heart Rate (bpm): 155 Fundal Height: 35 cm Movement: Present     General:  Alert, oriented and cooperative. Patient is in no acute distress.  Skin: Skin is warm and dry. No rash noted.    Cardiovascular: Normal heart rate noted  Respiratory: Normal respiratory effort, no problems with respiration noted  Abdomen: Soft, gravid, appropriate for gestational age. Pain/Pressure: Present     Pelvic:  Cervical exam deferred        Extremities: Normal range of motion.  Edema: Mild pitting, slight indentation - edema noted bilaterally in lower extremities. No tightness, negative Homan's sign, no redness, or pain in extremity. Legs equally warm to touch.  ental Status: Normal mood and affect. Normal behavior. Normal judgment and thought content.     Assessment   24 y.o. G3P0020 at [redacted]w[redacted]d by  05/23/2020, Date entered prior to episode creation presenting for routine prenatal visit  Plan   THIRD Problems (from 10/17/19 to present)    Problem Noted Resolved   Headache in pregnancy, antepartum, third trimester 03/28/2020 by Zipporah Plants, CNM No   Supervision of other normal pregnancy, antepartum 10/17/2019 by Mirna Mires, CNM No   Overview Addendum 03/20/2020  2:24 PM by Natale Milch, MD     Nursing Staff Provider  Office Location  [facility] Dating   6 wk [place]  Language  English Anatomy US  normal  Flu Vaccine  Declined Genetic Screen  NIPS: normal xx   TDaP vaccine   03/20/2020 Hgb A1C or  GTT Third trimester : 106  Rhogam   not needed   LAB RESULTS   Feeding Plan Pumping, desires to mostly bottle feed/ formula Blood Type --/--/O POS Performed at [facility], [address] [place], [place] [address]  [phone])   Contraception  Antibody    Circumcision  Rubella    Pediatrician   RPR NON REACTIVE (06/11 0918)   Support Person  HBsAg     Prenatal Classes  HIV Non Reactive (06/11 0177)    Varicella    BTL Consent  GBS  (For PCN allergy,  check sensitivities)        VBAC Consent  Pap   2021 NIL     Hgb Electro   not needed  Covid Declines CF - carrier     SMA  normal               Previous Version      -S>D, TWG 52lb - growth at next  visit  Preterm labor precautions including but not limited to vaginal bleeding, contractions, leaking of fluid and fetal movement were reviewed in detail with the patient.    Return in about 2 weeks (around 04/17/2020) for ROB and growth [place].  Zipporah Plants, CNM, MSN [facility], [facility] 04/03/2020, 2:26 PM

## 2020-04-05 ENCOUNTER — Encounter: Payer: Self-pay | Admitting: Obstetrics and Gynecology

## 2020-04-05 ENCOUNTER — Inpatient Hospital Stay
Admission: EM | Admit: 2020-04-05 | Discharge: 2020-04-07 | DRG: 833 | Disposition: A | Discharge status: Home / Self Care | Payer: Medicaid Other | Attending: Obstetrics & Gynecology | Admitting: Obstetrics & Gynecology

## 2020-04-05 ENCOUNTER — Other Ambulatory Visit: Payer: Self-pay

## 2020-04-05 DIAGNOSIS — Z87442 Personal history of urinary calculi: Secondary | ICD-10-CM

## 2020-04-05 DIAGNOSIS — F17210 Nicotine dependence, cigarettes, uncomplicated: Secondary | ICD-10-CM | POA: Diagnosis present

## 2020-04-05 DIAGNOSIS — Z3A33 33 weeks gestation of pregnancy: Secondary | ICD-10-CM | POA: Diagnosis not present

## 2020-04-05 DIAGNOSIS — O99333 Smoking (tobacco) complicating pregnancy, third trimester: Secondary | ICD-10-CM | POA: Diagnosis present

## 2020-04-05 DIAGNOSIS — O2303 Infections of kidney in pregnancy, third trimester: Secondary | ICD-10-CM | POA: Diagnosis present

## 2020-04-05 DIAGNOSIS — O26893 Other specified pregnancy related conditions, third trimester: Secondary | ICD-10-CM

## 2020-04-05 DIAGNOSIS — Z3480 Encounter for supervision of other normal pregnancy, unspecified trimester: Secondary | ICD-10-CM

## 2020-04-05 DIAGNOSIS — M549 Dorsalgia, unspecified: Secondary | ICD-10-CM

## 2020-04-05 DIAGNOSIS — Z20822 Contact with and (suspected) exposure to covid-19: Secondary | ICD-10-CM | POA: Diagnosis present

## 2020-04-05 DIAGNOSIS — R109 Unspecified abdominal pain: Secondary | ICD-10-CM | POA: Diagnosis present

## 2020-04-05 DIAGNOSIS — O99891 Other specified diseases and conditions complicating pregnancy: Secondary | ICD-10-CM

## 2020-04-05 HISTORY — DX: Infections of kidney in pregnancy, third trimester: O23.03

## 2020-04-05 LAB — COMPREHENSIVE METABOLIC PANEL
ALT: 12 U/L (ref 0–44)
AST: 16 U/L (ref 15–41)
Albumin: 2.9 g/dL — ABNORMAL LOW (ref 3.5–5.0)
Alkaline Phosphatase: 131 U/L — ABNORMAL HIGH (ref 38–126)
Anion gap: 10 (ref 5–15)
BUN: 5 mg/dL — ABNORMAL LOW (ref 6–20)
CO2: 21 mmol/L — ABNORMAL LOW (ref 22–32)
Calcium: 8.3 mg/dL — ABNORMAL LOW (ref 8.9–10.3)
Chloride: 105 mmol/L (ref 98–111)
Creatinine, Ser: 0.32 mg/dL — ABNORMAL LOW (ref 0.44–1.00)
GFR, Estimated: >60 mL/min (ref >60)
Glucose, Bld: 116 mg/dL — ABNORMAL HIGH (ref 70–99)
Potassium: 3.6 mmol/L (ref 3.5–5.1)
Sodium: 136 mmol/L (ref 135–145)
Total Bilirubin: 0.4 mg/dL (ref 0.3–1.2)
Total Protein: 6.5 g/dL (ref 6.5–8.1)

## 2020-04-05 LAB — URINALYSIS, COMPLETE (UACMP) WITH MICROSCOPIC
Bilirubin Urine: NEGATIVE
Glucose, UA: 50 mg/dL — AB
Hgb urine dipstick: NEGATIVE
Ketones, ur: NEGATIVE mg/dL
Nitrite: NEGATIVE
Protein, ur: NEGATIVE mg/dL
Specific Gravity, Urine: 1.01 (ref 1.005–1.030)
pH: 6 (ref 5.0–8.0)

## 2020-04-05 LAB — RESP PANEL BY RT-PCR (FLU A&B, COVID) ARPGX2
Influenza A by PCR: NEGATIVE
Influenza B by PCR: NEGATIVE
SARS Coronavirus 2 by RT PCR: NEGATIVE

## 2020-04-05 LAB — CBC
HCT: 30.7 % — ABNORMAL LOW (ref 36.0–46.0)
Hemoglobin: 10.5 g/dL — ABNORMAL LOW (ref 12.0–15.0)
MCH: 30.3 pg (ref 26.0–34.0)
MCHC: 34.2 g/dL (ref 30.0–36.0)
MCV: 88.7 fL (ref 80.0–100.0)
Platelets: 256 10*3/uL (ref 150–400)
RBC: 3.46 MIL/uL — ABNORMAL LOW (ref 3.87–5.11)
RDW: 12.3 % (ref 11.5–15.5)
WBC: 18.9 10*3/uL — ABNORMAL HIGH (ref 4.0–10.5)
nRBC: 0 % (ref 0.0–0.2)

## 2020-04-05 LAB — LIPASE, BLOOD: Lipase: 28 U/L (ref 11–51)

## 2020-04-05 MED ORDER — CALCIUM CARBONATE ANTACID 500 MG PO CHEW
2.0000 | CHEWABLE_TABLET | ORAL | Status: DC | PRN
  Administered 2020-04-05: 400 mg via ORAL
  Filled 2020-04-05: qty 2

## 2020-04-05 MED ORDER — PRENATAL MULTIVITAMIN CH
1.0000 | ORAL_TABLET | Freq: Every day | ORAL | Status: DC
  Administered 2020-04-06: 13:00:00 1 via ORAL
  Filled 2020-04-05 (×2): qty 1

## 2020-04-05 MED ORDER — ONDANSETRON 4 MG PO TBDP: 4.0000 mg | ORAL_TABLET | Freq: Four times a day (QID) | ORAL | Status: DC | PRN

## 2020-04-05 MED ORDER — SODIUM CHLORIDE 0.9 % IV SOLN: INTRAVENOUS | Status: DC

## 2020-04-05 MED ORDER — ACETAMINOPHEN 325 MG PO TABS
650.0000 mg | ORAL_TABLET | ORAL | Status: DC | PRN
  Administered 2020-04-05 – 2020-04-06 (×3): 650 mg via ORAL
  Filled 2020-04-05 (×4): qty 2

## 2020-04-05 MED ORDER — SODIUM CHLORIDE 0.9 % IV SOLN
1.0000 g | INTRAVENOUS | Status: DC
  Administered 2020-04-05 – 2020-04-06 (×2): 1 g via INTRAVENOUS
  Filled 2020-04-05 (×2): qty 10
  Filled 2020-04-05: qty 1

## 2020-04-05 NOTE — OB Triage Note (Signed)
Pt states abdominal pain started at 5:30am. Pain is in lower abdomen and intermittent. No leaking of fluid. No bleeding. Positive fetal movement. Pt states last BM was this am and was normal.

## 2020-04-05 NOTE — H&P (Signed)
Obstetric H&P   Chief Complaint: Abdominal and back pain  Prenatal Care Provider: [facility]  History of Present Illness: 24 y.o. G3P0020 [redacted]w[redacted]d by 05/23/2020,  presenting to L&D with abdominal pain starting this morning at 0530.  She went to work but left 2-hrs early because of worsening pain.  The pain is most pronounce left lower quadrant and back.  She does not have a history of nephrolithiasis.  Does have a history of UTI, and was recently treated outpatient for a UTI on 12/11/20201 with macrobid, no culture from that episode.  Pain is unrelated to po intake or bowl movement.  She has had some mild nausea but no emesis.  Denies fevers or chills.  The pain is constant does not come in waves.  No hematuria.    +FM, no LOF, no VB, no ctx  Pregravid weight 59 kg Total Weight Gain 23.6 kg  THIRD Problems (from 10/17/19 to present)    Problem Noted Resolved   Headache in pregnancy, antepartum, third trimester 03/28/2020 by Zipporah Plants, CNM No   Supervision of other normal pregnancy, antepartum 10/17/2019 by Mirna Mires, CNM No   Overview Addendum 03/20/2020  2:24 PM by Natale Milch, MD     Nursing Staff Provider  Office Location  [facility] Dating   6 wk [place]  Language  English Anatomy US  normal  Flu Vaccine  Declined Genetic Screen  NIPS: normal xx   TDaP vaccine   03/20/2020 Hgb A1C or  GTT Third trimester : 106  Rhogam   not needed   LAB RESULTS   Feeding Plan Pumping, desires to mostly bottle feed/ formula Blood Type --/--/O POS Performed at [facility], [address] [place], [place] [address]  [phone])   Contraception  Antibody    Circumcision  Rubella    Pediatrician   RPR NON REACTIVE (06/11 0918)   Support Person  HBsAg     Prenatal Classes  HIV Non Reactive (06/11 0918)    Varicella    BTL Consent  GBS  (For PCN allergy, check sensitivities)        VBAC Consent  Pap   2021 NIL     Hgb Electro   not needed  Covid Declines CF - carrier     SMA   normal               Previous Version       Review of Systems: 10 point review of systems negative unless otherwise noted in HPI  Past Medical History: Patient Active Problem List   Diagnosis Date Noted  . Labor and delivery indication for care or intervention 04/05/2020  . Headache in pregnancy, antepartum, third trimester 03/28/2020  . Labor and delivery, indication for care 03/17/2020  . Dysuria during pregnancy in third trimester   . Pelvic pain affecting pregnancy in third trimester, antepartum   . [redacted] weeks gestation of pregnancy   . BV (bacterial vaginosis) 10/19/2019    Noted after NOB. Rx for Metrogel   . Supervision of other normal pregnancy, antepartum 10/17/2019     Nursing Staff Provider  Office Location  [facility] Dating   6 wk [place]  Language  English Anatomy US  normal  Flu Vaccine  Declined Genetic Screen  NIPS: normal xx   TDaP vaccine   03/20/2020 Hgb A1C or  GTT Third trimester : 106  Rhogam   not needed   LAB RESULTS   Feeding Plan Pumping, desires to mostly  bottle feed/ formula Blood Type --/--/O POS Performed at [facility], [address] [place], [place] [address]  [phone](06/11 0741)   Contraception  Antibody    Circumcision  Rubella    Pediatrician   RPR NON REACTIVE (06/11 0918)   Support Person  HBsAg     Prenatal Classes  HIV Non Reactive (06/11 [id number])    Varicella    BTL Consent  GBS  (For PCN allergy, check sensitivities)        VBAC Consent  Pap   2021 NIL     Hgb Electro   not needed  Covid Declines CF - carrier     SMA  normal            . Hydronephrosis 05/18/2015  . Calculus of distal left ureter 05/18/2015  . Left ureteral stone 05/18/2015  . Major depressive disorder, single episode, moderate (HCC) 03/16/2011  . 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 Oppositional defiant disorder 03/16/2011    Past Surgical History: Past Surgical History:  Procedure Laterality Date  . CYSTO N/A  05/19/2015   Procedure: CYSTO;  Surgeon: Vanna ScotlandAshley Brandon, MD;  Location: [facility] ORS;  Service: Urology;  Laterality: N/A;  . STENT PLACE LEFT URETER ([facility] HX)    . URETEROSCOPY WITH HOLMIUM LASER LITHOTRIPSY Left 05/19/2015   Procedure: URETEROSCOPY WITH HOLMIUM LASER LITHOTRIPSY;  Surgeon: Vanna ScotlandAshley Brandon, MD;  Location: [facility] ORS;  Service: Urology;  Laterality: Left;    Past Obstetric History: # 1 - Date: None, Sex: None, Weight: None, GA: None, Delivery: None, Apgar1: None, Apgar5: None, Living: None, Birth Comments: None  # 2 - Date: None, Sex: None, Weight: None, GA: None, Delivery: None, Apgar1: None, Apgar5: None, Living: None, Birth Comments: None  # 3 - Date: None, Sex: None, Weight: None, GA: None, Delivery: None, Apgar1: None, Apgar5: None, Living: None, Birth Comments: None   Past Gynecologic History:  Family History: Family History  Problem Relation Age of Onset  . Diabetes Mellitus II Mother   . Bipolar disorder Mother   . Breast cancer Paternal Grandmother        not sure of age    Social History: Social History   Socioeconomic History  . Marital status: Single    Spouse name: Not on file  . Number of children: Not on file  . Years of education: Not on file  . Highest education level: Not on file  Occupational History  . Occupation: Consulting civil engineerstudent  Tobacco Use  . Smoking status: Current Every Day Smoker    Packs/day: 0.50    Types: Cigarettes  . Smokeless tobacco: Never Used  . Tobacco comment: quiting now  Vaping Use  . Vaping Use: Some days  . Substances: CBD  Substance and Sexual Activity  . Alcohol use: Not Currently    Comment: socially  . Drug use: Not Currently    Frequency: 1.0 times per week    Types: Marijuana    Comment: Last use one month age, uses vape with CBD currently  . Sexual activity: Not Currently    Partners: Male    Birth control/protection: Abstinence    Comment: Pt uses Depo shot had times 1 moth ago  Other Topics Concern  . Not on  file  Social History Narrative   ** Merged History Encounter **       ** Merged History Encounter **       Lives at home, independant   Social Determinants of Health  Financial Resource Strain: Not on file  Food Insecurity: Not on file  Transportation Needs: Not on file  Physical Activity: Not on file  Stress: Not on file  Social Connections: Not on file  Intimate Partner Violence: Not on file    Medications: Prior to Admission medications   Medication Sig Start Date End Date Taking? Authorizing Provider  Butalbital-APAP-Caffeine 50-325-40 MG capsule Take 1-2 capsules by mouth every 6 (six) hours as needed for headache. 03/28/20  Yes Veal, Katelyn, CNM  cyclobenzaprine (FLEXERIL) 10 MG tablet Take 1 tablet (10 mg total) by mouth every 8 (eight) hours as needed for muscle spasms. 01/10/20  Yes Conard Novak, MD  famotidine (PEPCID) 10 MG tablet Take 10 mg by mouth 2 (two) times daily.   Yes [provider]  nitrofurantoin, macrocrystal-monohydrate, (MACROBID) 100 MG capsule Take 1 capsule (100 mg total) by mouth 2 (two) times daily. 03/20/20  Yes Schuman, Christanna R, MD  ondansetron (ZOFRAN) 4 MG tablet Take 1 tablet (4 mg total) by mouth every 6 (six) hours as needed for nausea or vomiting. 03/29/20 03/29/21 Yes Schuman, Christanna R, MD  Prenatal Vit-Fe Fumarate-FA (MULTIVITAMIN-PRENATAL) 27-0.8 MG TABS tablet Take 1 tablet by mouth daily at 12 noon.   Yes [provider]  escitalopram (LEXAPRO) 10 MG tablet Take 10 mg by mouth daily. 12/31/19 04/05/20 Yes [provider]  albuterol (PROVENTIL HFA;VENTOLIN HFA) 108 (90 Base) MCG/ACT inhaler Inhale 2 puffs into the lungs every 6 (six) hours as needed for wheezing or shortness of breath. 05/22/18 10/28/19  Enid Derry, PA-C    Allergies: Allergies  Allergen Reactions  . Onion Swelling  . Adhesive [Tape] Other (See Comments)    Steri-Strips & Silk tape  . Tape Rash    Physical Exam: Vitals:  Blood pressure 119/67, pulse 100, temperature 98.4 F (36.9 C), temperature source Oral, height 5\' 2"  (1.575 m), weight 82.6 kg, last menstrual period 07/10/2019.  FHT: 125, moderate, +accels, no decels Toco: none  General: NAD HEENT: normocephalic, anicteric Pulmonary: No increased work of breathing Cardiovascular: RRR, distal pulses 2+ Abdomen: Gravid, non-tender on right, mild tenderness on left lower quadrant palpation.  No rebound or guarding Leopolds: Genitourinary: deferred MSK: mild CVA tenderness left Extremities: no edema, erythema, or tenderness Neurologic: Grossly intact Psychiatric: mood appropriate, affect full  Labs: Results for orders placed or performed during the hospital encounter of 04/05/20 (from the past 24 hour(s))  CBC     Status: Abnormal   Collection Time: 04/05/20  4:29 PM  Result Value Ref Range   WBC 18.9 (H) 4.0 - 10.5 K/uL   RBC 3.46 (L) 3.87 - 5.11 MIL/uL   Hemoglobin 10.5 (L) 12.0 - 15.0 g/dL   HCT 04/07/20 (L) 92.4 - 26.8 %   MCV 88.7 80.0 - 100.0 fL   MCH 30.3 26.0 - 34.0 pg   MCHC 34.2 30.0 - 36.0 g/dL   RDW 34.1 96.2 - 22.9 %   Platelets 256 150 - 400 K/uL   nRBC 0.0 0.0 - 0.2 %  Comprehensive metabolic panel     Status: Abnormal   Collection Time: 04/05/20  4:29 PM  Result Value Ref Range   Sodium 136 135 - 145 mmol/L   Potassium 3.6 3.5 - 5.1 mmol/L   Chloride 105 98 - 111 mmol/L   CO2 21 (L) 22 - 32 mmol/L   Glucose, Bld 116 (H) 70 - 99 mg/dL   BUN 5 (L) 6 - 20 mg/dL   Creatinine, Ser 04/07/20 (  L) 0.44 - 1.00 mg/dL   Calcium 8.3 (L) 8.9 - 10.3 mg/dL   Total Protein 6.5 6.5 - 8.1 g/dL   Albumin 2.9 (L) 3.5 - 5.0 g/dL   AST 16 15 - 41 U/L   ALT 12 0 - 44 U/L   Alkaline Phosphatase 131 (H) 38 - 126 U/L   Total Bilirubin 0.4 0.3 - 1.2 mg/dL   GFR, Estimated >64 >40 mL/min   Anion gap 10 5 - 15  Urinalysis, Complete w Microscopic Urine, Clean Catch     Status: Abnormal   Collection Time: 04/05/20  4:29 PM  Result Value Ref Range    Color, Urine YELLOW (A) YELLOW   APPearance CLOUDY (A) CLEAR   Specific Gravity, Urine 1.010 1.005 - 1.030   pH 6.0 5.0 - 8.0   Glucose, UA 50 (A) NEGATIVE mg/dL   Hgb urine dipstick NEGATIVE NEGATIVE   Bilirubin Urine NEGATIVE NEGATIVE   Ketones, ur NEGATIVE NEGATIVE mg/dL   Protein, ur NEGATIVE NEGATIVE mg/dL   Nitrite NEGATIVE NEGATIVE   Leukocytes,Ua LARGE (A) NEGATIVE   RBC / HPF 0-5 0 - 5 RBC/hpf   WBC, UA 11-20 0 - 5 WBC/hpf   Bacteria, UA RARE (A) NONE SEEN   Squamous Epithelial / LPF 6-10 0 - 5   Mucus PRESENT   Lipase, blood     Status: None   Collection Time: 04/05/20  4:29 PM  Result Value Ref Range   Lipase 28 11 - 51 U/L    Assessment: 24 y.o. G3P0020 [redacted]w[redacted]d by 05/23/2020, with possible pyelonephritis   Plan: 1) Abdominal back pain - laboratory work up negative other than elevation in WBC, large leuks and rare bacteria on UA.  She was recently treated with macrobid for UTI but no culture results from 03/29/2020.  WBC at that time 21.3K.  Today down to 18.9K. - given WBC has been elevated throughout third trimester including 28 week labs will also obtain a peripheral smear to evaluate - start ceftriaxone 1g IV q24-hrs for presumptive pylonephritis  2) Fetus - cat I tracing  3) PNL - Blood type O/Positive/-- (11/05 1553) / Anti-bodyscreen Negative (11/05 1553) / Rubella 2.45 (11/05 1553) / Varicella Immune / RPR Non Reactive (11/05 1553) / HBsAg Negative (11/05 1553) / HIV Non Reactive (11/05 1553) / 1-hr OGTT 106 / GBS    4) Immunization History -  Immunization History  Administered Date(s) Administered  . Tdap 03/20/2020    5) Disposition - pending improvement in symptoms  Vena Austria, MD, [facility], [facility] 04/05/2020, 6:14 PM

## 2020-04-06 ENCOUNTER — Other Ambulatory Visit: Payer: Self-pay | Admitting: Obstetrics & Gynecology

## 2020-04-06 ENCOUNTER — Encounter: Payer: Self-pay | Admitting: Obstetrics and Gynecology

## 2020-04-06 DIAGNOSIS — Z3A33 33 weeks gestation of pregnancy: Secondary | ICD-10-CM

## 2020-04-06 DIAGNOSIS — O2303 Infections of kidney in pregnancy, third trimester: Secondary | ICD-10-CM

## 2020-04-06 LAB — CBC
HCT: 29.9 % — ABNORMAL LOW (ref 36.0–46.0)
Hemoglobin: 10.4 g/dL — ABNORMAL LOW (ref 12.0–15.0)
MCH: 31.1 pg (ref 26.0–34.0)
MCHC: 34.8 g/dL (ref 30.0–36.0)
MCV: 89.5 fL (ref 80.0–100.0)
Platelets: 215 10*3/uL (ref 150–400)
RBC: 3.34 MIL/uL — ABNORMAL LOW (ref 3.87–5.11)
RDW: 12.4 % (ref 11.5–15.5)
WBC: 16.7 10*3/uL — ABNORMAL HIGH (ref 4.0–10.5)
nRBC: 0 % (ref 0.0–0.2)

## 2020-04-06 MED ORDER — OXYCODONE HCL 5 MG PO TABS
5.0000 mg | ORAL_TABLET | ORAL | Status: DC | PRN
  Administered 2020-04-06 – 2020-04-07 (×3): 5 mg via ORAL
  Filled 2020-04-06 (×3): qty 1

## 2020-04-06 MED ORDER — CEPHALEXIN 500 MG PO CAPS: 500.0000 mg | ORAL_CAPSULE | Freq: Four times a day (QID) | ORAL | 2 refills | Status: DC

## 2020-04-06 MED ORDER — CYCLOBENZAPRINE HCL 10 MG PO TABS
5.0000 mg | ORAL_TABLET | Freq: Two times a day (BID) | ORAL | Status: DC | PRN
  Administered 2020-04-06 – 2020-04-07 (×2): 5 mg via ORAL
  Filled 2020-04-06 (×5): qty 0.5

## 2020-04-06 NOTE — Progress Notes (Signed)
Pt complains of vague lower abdominal discomfort. Feels "sore" and is worse with activity. Denies cramping/contractions, no leakage of fluid or vaginal discharge. PRN tylenol given for discomfort.

## 2020-04-06 NOTE — Progress Notes (Signed)
Benign Gynecology Progress Note  Admission Date: 04/05/2020 Current Date: 04/06/2020  Suzanne Nelson is a 24 y.o. G3P0020 HD#1 @ [redacted]w[redacted]d  History complicated by: Patient Active Problem List   Diagnosis Date Noted  . Labor and delivery indication for care or intervention 04/05/2020  . Headache in pregnancy, antepartum, third trimester 03/28/2020  . Labor and delivery, indication for care 03/17/2020  . Dysuria during pregnancy in third trimester   . Pelvic pain affecting pregnancy in third trimester, antepartum   . [redacted] weeks gestation of pregnancy   . BV (bacterial vaginosis) 10/19/2019  . Supervision of other normal pregnancy, antepartum 10/17/2019  . Hydronephrosis 05/18/2015  . Calculus of distal left ureter 05/18/2015  . Left ureteral stone 05/18/2015  . Major depressive disorder, single episode, moderate (HCC) 03/16/2011  . 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 Oppositional defiant disorder 03/16/2011   ROS and patient/family/surgical history, located on admission H&P note dated 04/05/2020, have been reviewed, and there are no changes except as noted below  Yesterday/Overnight Events:  Admitted last night for pyelo in pregnancy  Subjective:  Pt reports no fever.  She has had pain during night, mostly lower quadrant abdominal pain, back pain has been present throughout pregnancy and is unchanged.  Good FM.  Objective:   Temp:  [97.7 F (36.5 C)-98.4 F (36.9 C)] 98.2 F (36.8 C) (12/19 0855) Pulse Rate:  [87-124] 87 (12/19 0855) Resp:  [18-20] 18 (12/19 0855) BP: (115-149)/(67-80) 115/74 (12/19 0855) SpO2:  [98 %-100 %] 98 % (12/19 0855) Weight:  [82.6 kg] 82.6 kg (12/18 1457) I/O last 3 completed shifts: In: 1708.2 [I.V.:1608.2; IV Piggyback:100] Out: 100 [Urine:100] No intake/output data recorded.  Physical exam: General appearance: alert, cooperative and appears stated age Abdomen: soft, ND, fundus appropriate level, mild T in lower  quadrants GU: No gross VB Lungs: clear to auscultation bilaterally Heart: regular rate and rhythm and no MRGs Extremities: no redness or tenderness in the calves or thighs, no edema Skin: no lesions Psych: appropriate  Recent Labs  Lab 04/05/20 1629  NA 136  K 3.6  CL 105  CO2 21*  BUN 5*  CREATININE 0.32*  GLUCOSE 116*   Recent Labs  Lab 04/05/20 1629 04/06/20 0530  WBC 18.9* 16.7*  HGB 10.5* 10.4*  HCT 30.7* 29.9*  PLT 256 215    Recent Labs  Lab 04/05/20 1629  CALCIUM 8.3*   No results for input(s): INR, APTT in the last 168 hours.    Assessment & Plan:   Problem List Items Addressed This Visit      Other   Supervision of other normal pregnancy, antepartum   Headache in pregnancy, antepartum, third trimester   Relevant Medications   acetaminophen (TYLENOL) tablet 650 mg   oxyCODONE (Oxy IR/ROXICODONE) immediate release tablet 5 mg   cyclobenzaprine (FLEXERIL) tablet 5 mg    Improving WBC, no fever Still has pain  Plan continued IV ABX (first dose Ceftrixone 2000 last pm) Plan conversion to po ABX when improves, then outpatient mgt    Keflex, has failed Macrobid therapy Analgesia and sleep aid   Annamarie Major, MD, [facility], [facility] 04/06/2020  10:12 AM

## 2020-04-06 NOTE — Plan of Care (Signed)
  Problem: Education: Goal: Knowledge of disease or condition will improve Outcome: Progressing Goal: Knowledge of the prescribed therapeutic regimen will improve Outcome: Progressing Goal: Individualized Educational Video(s) Outcome: Progressing   Problem: Clinical Measurements: Goal: Complications related to the disease process, condition or treatment will be avoided or minimized Outcome: Progressing   

## 2020-04-07 ENCOUNTER — Inpatient Hospital Stay: Payer: Medicaid Other

## 2020-04-07 DIAGNOSIS — O99891 Other specified diseases and conditions complicating pregnancy: Secondary | ICD-10-CM

## 2020-04-07 DIAGNOSIS — M549 Dorsalgia, unspecified: Secondary | ICD-10-CM

## 2020-04-07 LAB — PATHOLOGIST SMEAR REVIEW

## 2020-04-07 LAB — URINE CULTURE
Culture: 40000 — AB
Special Requests: NORMAL

## 2020-04-07 NOTE — Progress Notes (Signed)
Subjective:  She was sleepy when I entered the room. She had no new complaints. She felt that her pain was improved, but she was not certain because she was just waking up  Objective:   Blood pressure 109/63, pulse 96, temperature 98.1 F (36.7 C), temperature source Oral, resp. rate 18, height 5\' 2"  (1.575 m), weight 82.6 kg, last menstrual period 07/10/2019, SpO2 99 %.  General: NAD Pulmonary: no increased work of breathing Abdomen: non-distended, non-tender Extremities: no edema, no erythema, no tenderness, no signs of DVT  Results for orders placed or performed during the hospital encounter of 04/05/20 (from the past 72 hour(s))  CBC     Status: Abnormal   Collection Time: 04/05/20  4:29 PM  Result Value Ref Range   WBC 18.9 (H) 4.0 - 10.5 K/uL   RBC 3.46 (L) 3.87 - 5.11 MIL/uL   Hemoglobin 10.5 (L) 12.0 - 15.0 g/dL   HCT 04/07/20 (L) 99.3 - 71.6 %   MCV 88.7 80.0 - 100.0 fL   MCH 30.3 26.0 - 34.0 pg   MCHC 34.2 30.0 - 36.0 g/dL   RDW 96.7 89.3 - 81.0 %   Platelets 256 150 - 400 K/uL   nRBC 0.0 0.0 - 0.2 %    Comment: Performed at [facility], [address] [place], [place]  Comprehensive metabolic panel     Status: Abnormal   Collection Time: 04/05/20  4:29 PM  Result Value Ref Range   Sodium 136 135 - 145 mmol/L   Potassium 3.6 3.5 - 5.1 mmol/L   Chloride 105 98 - 111 mmol/L   CO2 21 (L) 22 - 32 mmol/L   Glucose, Bld 116 (H) 70 - 99 mg/dL    Comment: Glucose reference range applies only to samples taken after fasting for at least 8 hours.   BUN 5 (L) 6 - 20 mg/dL   Creatinine, Ser 04/07/20 (L) 0.44 - 1.00 mg/dL   Calcium 8.3 (L) 8.9 - 10.3 mg/dL   Total Protein 6.5 6.5 - 8.1 g/dL   Albumin 2.9 (L) 3.5 - 5.0 g/dL   AST 16 15 - 41 U/L   ALT 12 0 - 44 U/L   Alkaline Phosphatase 131 (H) 38 - 126 U/L   Total Bilirubin 0.4 0.3 - 1.2 mg/dL   GFR, Estimated 5.27 >78 mL/min    Comment: (NOTE) Calculated using the CKD-EPI Creatinine Equation (2021)     Anion gap 10 5 - 15    Comment: Performed at [facility], [address] [place], [place]  Urinalysis, Complete w Microscopic Urine, Clean Catch     Status: Abnormal   Collection Time: 04/05/20  4:29 PM  Result Value Ref Range   Color, Urine YELLOW (A) YELLOW   APPearance CLOUDY (A) CLEAR   Specific Gravity, Urine 1.010 1.005 - 1.030   pH 6.0 5.0 - 8.0   Glucose, UA 50 (A) NEGATIVE mg/dL   Hgb urine dipstick NEGATIVE NEGATIVE   Bilirubin Urine NEGATIVE NEGATIVE   Ketones, ur NEGATIVE NEGATIVE mg/dL   Protein, ur NEGATIVE NEGATIVE mg/dL   Nitrite NEGATIVE NEGATIVE   Leukocytes,Ua LARGE (A) NEGATIVE   RBC / HPF 0-5 0 - 5 RBC/hpf   WBC, UA 11-20 0 - 5 WBC/hpf   Bacteria, UA RARE (A) NONE SEEN   Squamous Epithelial / LPF 6-10 0 - 5   Mucus PRESENT     Comment: Performed at [facility], [address], [place], [place]  [address]  Lipase, blood     Status: None   Collection Time: 04/05/20  4:29 PM  Result Value Ref Range   Lipase 28 11 - 51 U/L    Comment: Performed at [facility], [address] [place], [place] [address]  Urine culture     Status: Abnormal   Collection Time: 04/05/20  4:29 PM   Specimen: Urine, Random  Result Value Ref Range   Specimen Description      URINE, RANDOM Performed at [facility], [address], [place], [place] [address]    Special Requests      Normal Performed at [facility], [address] [place], [place] [address]    Culture (A)     40,000 COLONIES/mL MULTIPLE SPECIES PRESENT, SUGGEST RECOLLECTION   Report Status 04/07/2020 FINAL   Resp Panel by RT-PCR (Flu A&B, Covid) Nasopharyngeal Swab     Status: None   Collection Time: 04/05/20  8:01 PM   Specimen: Nasopharyngeal Swab; Nasopharyngeal(NP) swabs in vial transport medium  Result Value Ref Range   SARS Coronavirus 2 by RT PCR NEGATIVE NEGATIVE    Comment: (NOTE) SARS-CoV-2 target nucleic acids are NOT DETECTED.  The  SARS-CoV-2 RNA is generally detectable in upper respiratory specimens during the acute phase of infection. The lowest concentration of SARS-CoV-2 viral copies this assay can detect is 138 copies/mL. A negative result does not preclude SARS-Cov-2 infection and should not be used as the sole basis for treatment or other patient management decisions. A negative result may occur with  improper specimen collection/handling, submission of specimen other than nasopharyngeal swab, presence of viral mutation(s) within the areas targeted by this assay, and inadequate number of viral copies(<138 copies/mL). A negative result must be combined with clinical observations, patient history, and epidemiological information. The expected result is Negative.  Fact Sheet for Patients:  BloggerCourse.com  Fact Sheet for Healthcare Providers:  SeriousBroker.it  This test is no t yet approved or cleared by the [place] FDA and  has been authorized for detection and/or diagnosis of SARS-CoV-2 by FDA under an Emergency Use Authorization (EUA). This EUA will remain  in effect (meaning this test can be used) for the duration of the COVID-19 declaration under Section 564(b)(1) of the Act, 21 U.S.C.section 360bbb-3(b)(1), unless the authorization is terminated  or revoked sooner.       Influenza A by PCR NEGATIVE NEGATIVE   Influenza B by PCR NEGATIVE NEGATIVE    Comment: (NOTE) The Xpert Xpress SARS-CoV-2/FLU/RSV plus assay is intended as an aid in the diagnosis of influenza from Nasopharyngeal swab specimens and should not be used as a sole basis for treatment. Nasal washings and aspirates are unacceptable for Xpert Xpress SARS-CoV-2/FLU/RSV testing.  Fact Sheet for Patients: BloggerCourse.com  Fact Sheet for Healthcare Providers: SeriousBroker.it  This test is not yet approved or cleared by the  [place] FDA and has been authorized for detection and/or diagnosis of SARS-CoV-2 by FDA under an Emergency Use Authorization (EUA). This EUA will remain in effect (meaning this test can be used) for the duration of the COVID-19 declaration under Section 564(b)(1) of the Act, 21 U.S.C. section 360bbb-3(b)(1), unless the authorization is terminated or revoked.  Performed at [facility], [address] [place], [place] [address]   CBC     Status: Abnormal   Collection Time: 04/06/20  5:30 AM  Result Value Ref Range   WBC 16.7 (H) 4.0 - 10.5 K/uL   RBC 3.34 (L)  3.87 - 5.11 MIL/uL   Hemoglobin 10.4 (L) 12.0 - 15.0 g/dL   HCT 70.2 (L) 63.7 - 85.8 %   MCV 89.5 80.0 - 100.0 fL   MCH 31.1 26.0 - 34.0 pg   MCHC 34.8 30.0 - 36.0 g/dL   RDW 85.0 27.7 - 41.2 %   Platelets 215 150 - 400 K/uL   nRBC 0.0 0.0 - 0.2 %    Comment: Performed at [facility], [address], [place], [place] [address]    Assessment:   24 y.o. [phone] [redacted]w[redacted]d  hospital day # 3  Plan:   1) Being treated for pyelonephritis- urine culture shows multiple species, suggest recollection. Continue antibiotics. History of renal stones and prolonged pelvic pain, will check renal US.   2) Regular diet  3) Fetal NST q shift  4) Continue care at this time.    Adelene Idler MD [facility], [facility] 04/07/2020 9:03 AM

## 2020-04-07 NOTE — Discharge Summary (Signed)
Physician Final Progress Note  Patient ID: CLIFFORD COUDRIET MRN: [id number] DOB/AGE: October 05, 1995 24 y.o.  Admit date: 04/05/2020 Admitting provider: Vena Austria, MD Discharge date: 04/07/2020   Admission Diagnoses: abdominal pain, back pain  Discharge Diagnoses:  Active Problems:   Labor and delivery indication for care or intervention   Pyelonephritis affecting pregnancy in third trimester   [redacted] weeks gestation of pregnancy   History of Present Illness: The patient is a 24 y.o. female G3P0020 at [redacted]w[redacted]d who presents for pain in left lower quadrant and back- generalized. She denies bleeding, leaking of fluid or contractions. She reports good fetal movement. Patient has responded well to treatment with IV antibiotics and IV fluids. She denies pain this morning. Labs during hospital stay were remarkable for elevated WBC and decreasing by discharge, UA with large leukocytes, 40k colony count multiple species on urine culture. Imaging is notable for normal renal ultrasound. Monitoring is reassuring and vital signs normal. Patient is discharged to home with precautions, instructions and already has Rx for home antibiotics- Keflex.   Past Medical History:  Diagnosis Date  . ADHD (attention deficit hyperactivity disorder)   . Anxiety   . Asthma   . Asthma   . Depression   . Migraine   . No pertinent past medical history   . Panic attack   . Pyelonephritis affecting pregnancy in third trimester     Past Surgical History:  Procedure Laterality Date  . CYSTO N/A 05/19/2015   Procedure: CYSTO;  Surgeon: Vanna Scotland, MD;  Location: [facility] ORS;  Service: Urology;  Laterality: N/A;  . STENT PLACE LEFT URETER ([facility] HX)    . URETEROSCOPY WITH HOLMIUM LASER LITHOTRIPSY Left 05/19/2015   Procedure: URETEROSCOPY WITH HOLMIUM LASER LITHOTRIPSY;  Surgeon: Vanna Scotland, MD;  Location: [facility] ORS;  Service: Urology;  Laterality: Left;    No current facility-administered medications on file prior  to encounter.   Current Outpatient Medications on File Prior to Encounter  Medication Sig Dispense Refill  . Butalbital-APAP-Caffeine 50-325-40 MG capsule Take 1-2 capsules by mouth every 6 (six) hours as needed for headache. 24 capsule 0  . cyclobenzaprine (FLEXERIL) 10 MG tablet Take 1 tablet (10 mg total) by mouth every 8 (eight) hours as needed for muscle spasms. 30 tablet 1  . famotidine (PEPCID) 10 MG tablet Take 10 mg by mouth 2 (two) times daily.    . nitrofurantoin, macrocrystal-monohydrate, (MACROBID) 100 MG capsule Take 1 capsule (100 mg total) by mouth 2 (two) times daily. 14 capsule 1  . ondansetron (ZOFRAN) 4 MG tablet Take 1 tablet (4 mg total) by mouth every 6 (six) hours as needed for nausea or vomiting. 30 tablet 0  . Prenatal Vit-Fe Fumarate-FA (MULTIVITAMIN-PRENATAL) 27-0.8 MG TABS tablet Take 1 tablet by mouth daily at 12 noon.    . [DISCONTINUED] escitalopram (LEXAPRO) 10 MG tablet Take 10 mg by mouth daily.    . [DISCONTINUED] albuterol (PROVENTIL HFA;VENTOLIN HFA) 108 (90 Base) MCG/ACT inhaler Inhale 2 puffs into the lungs every 6 (six) hours as needed for wheezing or shortness of breath. 1 Inhaler 0    Allergies  Allergen Reactions  . Onion Swelling  . Adhesive [Tape] Other (See Comments)    Steri-Strips & Silk tape  . Tape Rash    Reaction to Paper tape. Pt denies reaction to tegaderm or surgical tape.    Social History   Socioeconomic History  . Marital status: Single    Spouse name: Not on file  . Number of children:  Not on file  . Years of education: Not on file  . Highest education level: Not on file  Occupational History  . Occupation: Consulting civil engineerstudent  Tobacco Use  . Smoking status: Current Every Day Smoker    Packs/day: 0.50    Types: Cigarettes  . Smokeless tobacco: Never Used  . Tobacco comment: quiting now  Vaping Use  . Vaping Use: Some days  . Substances: CBD  Substance and Sexual Activity  . Alcohol use: Not Currently    Comment: socially  .  Drug use: Not Currently    Frequency: 1.0 times per week    Types: Marijuana    Comment: Last use one month age, uses vape with CBD currently  . Sexual activity: Not Currently    Partners: Male    Birth control/protection: Abstinence    Comment: Pt uses Depo shot had times 1 moth ago  Other Topics Concern  . Not on file  Social History Narrative   ** Merged History Encounter **       ** Merged History Encounter **       Lives at home, independant   Social Determinants of Health   Financial Resource Strain: Not on file  Food Insecurity: Not on file  Transportation Needs: Not on file  Physical Activity: Not on file  Stress: Not on file  Social Connections: Not on file  Intimate Partner Violence: Not on file    Family History  Problem Relation Age of Onset  . Diabetes Mellitus II Mother   . Bipolar disorder Mother   . Breast cancer Paternal Grandmother        not sure of age     Review of Systems  Constitutional: Negative for chills and fever.  HENT: Negative for congestion, ear discharge, ear pain, hearing loss, sinus pain and sore throat.   Eyes: Negative for blurred vision and double vision.  Respiratory: Negative for cough, shortness of breath and wheezing.   Cardiovascular: Negative for chest pain, palpitations and leg swelling.  Gastrointestinal: Negative for abdominal pain, blood in stool, constipation, diarrhea, heartburn, melena, nausea and vomiting.  Genitourinary: Negative for dysuria, flank pain, frequency, hematuria and urgency.  Musculoskeletal: Negative for back pain, joint pain and myalgias.  Skin: Negative for itching and rash.  Neurological: Negative for dizziness, tingling, tremors, sensory change, speech change, focal weakness, seizures, loss of consciousness, weakness and headaches.  Endo/Heme/Allergies: Negative for environmental allergies. Does not bruise/bleed easily.  Psychiatric/Behavioral: Negative for depression, hallucinations, memory loss,  substance abuse and suicidal ideas. The patient is not nervous/anxious and does not have insomnia.      Physical Exam: BP 109/63 (BP Location: Right Arm)   Pulse 96   Temp 98.1 F (36.7 C) (Oral)   Resp 18   Ht 5\' 2"  (1.575 m)   Wt 82.6 kg   LMP 07/10/2019 (Within Weeks)   SpO2 99% Comment: Room Air  BMI 33.29 kg/m   Constitutional: Well nourished, well developed female in no acute distress.  HEENT: normal Skin: Warm and dry.  Cardiovascular: Regular rate and rhythm.   Extremity: no edema  Respiratory: Clear to auscultation bilateral. Normal respiratory effort Abdomen: FHT present Back: no CVAT Neuro: DTRs 2+, Cranial nerves grossly intact Psych: Alert and Oriented x3. No memory deficits. Normal mood and affect.   Toco: negative Fetal well being: 130 bpm, moderate variability, +accelerations, -decelerations  Consults: None  Significant Findings/ Diagnostic Studies: labs/imaging:  Results for [place], Arti M (MRN [id number]) as of 04/07/2020 12:03  Ref. Range 04/05/2020 16:29 04/05/2020 20:01 04/06/2020 05:30  RDW Latest Ref Range: 11.5 - 15.5 % 12.3  12.4  Platelets Latest Ref Range: 150 - 400 K/uL 256  215  nRBC Latest Ref Range: 0.0 - 0.2 % 0.0  0.0  PATHOLOGIST SMEAR REVIEW Unknown Rpt    Glucose Latest Ref Range: 70 - 99 mg/dL 191 (H)    RESP PANEL BY RT-PCR (FLU A&B, COVID) ARPGX2 Unknown  Rpt   Influenza A By PCR Latest Ref Range: NEGATIVE   NEGATIVE   Influenza B By PCR Latest Ref Range: NEGATIVE   NEGATIVE   SARS Coronavirus 2 by RT PCR Latest Ref Range: NEGATIVE   NEGATIVE   Appearance Latest Ref Range: CLEAR  CLOUDY (A)    Bilirubin Urine Latest Ref Range: NEGATIVE  NEGATIVE    Color, Urine Latest Ref Range: YELLOW  YELLOW (A)    Glucose, UA Latest Ref Range: NEGATIVE mg/dL 50 (A)    Hgb urine dipstick Latest Ref Range: NEGATIVE  NEGATIVE    Ketones, ur Latest Ref Range: NEGATIVE mg/dL NEGATIVE    Leukocytes,Ua Latest Ref Range: NEGATIVE  LARGE (A)     Nitrite Latest Ref Range: NEGATIVE  NEGATIVE    pH Latest Ref Range: 5.0 - 8.0  6.0    Protein Latest Ref Range: NEGATIVE mg/dL NEGATIVE    Specific Gravity, Urine Latest Ref Range: 1.005 - 1.030  1.010    Bacteria, UA Latest Ref Range: NONE SEEN  RARE (A)    Mucus Unknown PRESENT    RBC / HPF Latest Ref Range: 0 - 5 RBC/hpf 0-5    Squamous Epithelial / LPF Latest Ref Range: 0 - 5  6-10    WBC, UA Latest Ref Range: 0 - 5 WBC/hpf 11-20    URINE CULTURE Unknown Rpt (A)      CLINICAL DATA:  Flank and back pain.  Third trimester pregnancy.  EXAM: RENAL / URINARY TRACT ULTRASOUND COMPLETE  COMPARISON:  None.  FINDINGS: Right Kidney:  Renal measurements: 12.7 x 6.0 x 5.4 cm = volume: 213 mL. Echogenicity is within normal limits. Mild right-sided hydronephrosis is present. This resolves post voiding. No stone or mass lesion is present.  Left Kidney:  Renal measurements: 11.8 x 5.5 x 4 9 cm = volume: 167 mL. Echogenicity within normal limits. No mass or hydronephrosis visualized.  Bladder:  Appears normal for degree of bladder distention.  Other:  None.  IMPRESSION: 1. Mild right-sided hydronephrosis resolves after voiding. This could be related to the flank pain distal obstruction by the gravid uterus. No stone or mass lesion is present. 2. Normal sonographic appearance of the left kidney.  Electronically Signed   By: Marin Roberts M.D.   On: 04/07/2020 10:45  Procedures: NST  Hospital Course: The patient was admitted to Labor and Delivery Triage for observation.   Discharge Condition: good  Disposition: Discharge disposition: 01-Home or Self Care  Diet: Regular diet  Discharge Activity: Activity as tolerated  Discharge Instructions    Discharge activity:  No Restrictions   Complete by: As directed    Discharge diet:  No restrictions   Complete by: As directed    No sexual activity restrictions   Complete by: As directed    Notify  physician for a general feeling that "something is not right"   Complete by: As directed    Notify physician for increase or change in vaginal discharge   Complete by: As directed    Notify physician  for intestinal cramps, with or without diarrhea, sometimes described as "gas pain"   Complete by: As directed    Notify physician for leaking of fluid   Complete by: As directed    Notify physician for low, dull backache, unrelieved by heat or Tylenol   Complete by: As directed    Notify physician for menstrual like cramps   Complete by: As directed    Notify physician for pelvic pressure   Complete by: As directed    Notify physician for uterine contractions.  These may be painless and feel like the uterus is tightening or the baby is  "balling up"   Complete by: As directed    Notify physician for vaginal bleeding   Complete by: As directed    PRETERM LABOR:  Includes any of the follwing symptoms that occur between 20 - [redacted] weeks gestation.  If these symptoms are not stopped, preterm labor can result in preterm delivery, placing your baby at risk   Complete by: As directed      Allergies as of 04/07/2020      Reactions   Onion Swelling   Adhesive [tape] Other (See Comments)   Steri-Strips & Silk tape   Tape Rash   Reaction to Paper tape. Pt denies reaction to tegaderm or surgical tape.      Medication List    STOP taking these medications   nitrofurantoin (macrocrystal-monohydrate) 100 MG capsule Commonly known as: MACROBID     TAKE these medications   Butalbital-APAP-Caffeine 50-325-40 MG capsule Take 1-2 capsules by mouth every 6 (six) hours as needed for headache.   cephALEXin 500 MG capsule Commonly known as: KEFLEX Take 1 capsule (500 mg total) by mouth 4 (four) times daily.   cyclobenzaprine 10 MG tablet Commonly known as: FLEXERIL Take 1 tablet (10 mg total) by mouth every 8 (eight) hours as needed for muscle spasms.   famotidine 10 MG tablet Commonly known as:  PEPCID Take 10 mg by mouth 2 (two) times daily.   multivitamin-prenatal 27-0.8 MG Tabs tablet Take 1 tablet by mouth daily at 12 noon.   ondansetron 4 MG tablet Commonly known as: Zofran Take 1 tablet (4 mg total) by mouth every 6 (six) hours as needed for nausea or vomiting.       Follow-up Information    [facility]. Go to.   Specialty: Obstetrics and Gynecology Why: scheduled prenatal appointment Contact information: [address] [place] [address] [phone]              Total time spent taking care of this patient: 30 minutes  Signed: Tresea Mall, CNM  04/07/2020, 11:48 AM

## 2020-04-07 NOTE — Progress Notes (Signed)
Pt discharged home in stable condition with mother. Pt alert and reports no pain or worsening symptoms. Discharge instructions/information given-pt. Verbalized understanding of all information, medications, and follow-up appts.

## 2020-04-08 LAB — URINE CULTURE: Culture: NO GROWTH

## 2020-04-16 ENCOUNTER — Encounter: Payer: Medicaid Other | Admitting: Obstetrics and Gynecology

## 2020-04-16 ENCOUNTER — Ambulatory Visit (INDEPENDENT_AMBULATORY_CARE_PROVIDER_SITE_OTHER): Payer: Medicaid Other | Admitting: Obstetrics and Gynecology

## 2020-04-16 ENCOUNTER — Ambulatory Visit (INDEPENDENT_AMBULATORY_CARE_PROVIDER_SITE_OTHER): Payer: Medicaid Other

## 2020-04-16 ENCOUNTER — Other Ambulatory Visit: Payer: Self-pay

## 2020-04-16 VITALS — BP 122/78 | Wt 189.0 lb

## 2020-04-16 DIAGNOSIS — O2303 Infections of kidney in pregnancy, third trimester: Secondary | ICD-10-CM | POA: Diagnosis not present

## 2020-04-16 DIAGNOSIS — Z3A34 34 weeks gestation of pregnancy: Secondary | ICD-10-CM

## 2020-04-16 DIAGNOSIS — Z3480 Encounter for supervision of other normal pregnancy, unspecified trimester: Secondary | ICD-10-CM

## 2020-04-16 DIAGNOSIS — R102 Pelvic and perineal pain: Secondary | ICD-10-CM

## 2020-04-16 DIAGNOSIS — O26893 Other specified pregnancy related conditions, third trimester: Secondary | ICD-10-CM

## 2020-04-16 DIAGNOSIS — O99213 Obesity complicating pregnancy, third trimester: Secondary | ICD-10-CM | POA: Diagnosis not present

## 2020-04-16 LAB — POCT URINALYSIS DIPSTICK OB: Glucose, UA: NEGATIVE

## 2020-04-16 NOTE — Progress Notes (Signed)
Routine Prenatal Care Visit  Subjective  Suzanne Nelson is a 24 y.o. G3P0020 at [redacted]w[redacted]d being seen today for ongoing prenatal care.  She is currently monitored for the following issues for this low-risk pregnancy and has Major depressive disorder, single episode, moderate (HCC); Attention deficit hyperactivity disorder, combined type; Oppositional defiant disorder; Hydronephrosis; Calculus of distal left ureter; Left ureteral stone; Supervision of other normal pregnancy, antepartum; BV (bacterial vaginosis); Labor and delivery, indication for care; Dysuria during pregnancy in third trimester; Pelvic pain affecting pregnancy in third trimester, antepartum; Headache in pregnancy, antepartum, third trimester; Labor and delivery indication for care or intervention; Pyelonephritis affecting pregnancy in third trimester; and Back pain affecting pregnancy in third trimester on their problem list.  ----------------------------------------------------------------------------------- Patient reports no complaints.   Contractions: Not present. Vag. Bleeding: None.  Movement: Present. Denies leaking of fluid.  ----------------------------------------------------------------------------------- The following portions of the patient's history were reviewed and updated as appropriate: allergies, current medications, past family history, past medical history, past social history, past surgical history and problem list. Problem list updated.   Objective  Blood pressure 122/78, weight 189 lb (85.7 kg), last menstrual period 07/10/2019.  Pregravid weight 130 lb (59 kg) Total Weight Gain 59 lb (26.8 kg) Urinalysis:      Fetal Status: Fetal Heart Rate (bpm): 140 Fundal Height: 35 cm Movement: Present  Presentation: Vertex  General:  Alert, oriented and cooperative. Patient is in no acute distress.  Skin: Skin is warm and dry. No rash noted.   Cardiovascular: Normal heart rate noted  Respiratory: Normal  respiratory effort, no problems with respiration noted  Abdomen: Soft, gravid, appropriate for gestational age. Pain/Pressure: Present     Pelvic:  Cervical exam deferred        Extremities: Normal range of motion.     ental Status: Normal mood and affect. Normal behavior. Normal judgment and thought content.     Assessment   24 y.o. G3P0020 at [redacted]w[redacted]d by  05/23/2020, Date entered prior to episode creation presenting for routine prenatal visit  Plan   THIRD Problems (from 10/17/19 to present)    Problem Noted Resolved   Headache in pregnancy, antepartum, third trimester 03/28/2020 by Zipporah Plants, CNM No   Supervision of other normal pregnancy, antepartum 10/17/2019 by Mirna Mires, CNM No   Overview Addendum 03/20/2020  2:24 PM by Natale Milch, MD     Nursing Staff Provider  Office Location  [facility] Dating   6 wk [place]  Language  English Anatomy US  normal  Flu Vaccine  Declined Genetic Screen  NIPS: normal xx   TDaP vaccine   03/20/2020 Hgb A1C or  GTT Third trimester : 106  Rhogam   not needed   LAB RESULTS   Feeding Plan Pumping, desires to mostly bottle feed/ formula Blood Type --/--/O POS Performed at [facility], 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    1) Flank pain - continues to report flank pain, was admitted for  possible pyelonephritis.  Urine culture negative.  Renal ultrasound normal other than some pregnancy associated hydronephrosis.  Peripheral blood smear given WBC elevation since 28 week normal no blasts.  Discussed conservative measures.  Return in about 1 week (around 04/23/2020) for ROB.  Vena Austria, MD, [facility], [facility] 04/16/2020, 4:53 PM

## 2020-04-19 NOTE — L&D Delivery Note (Incomplete Revision)
Date of delivery: 05/18/2020 Estimated Date of Delivery: 05/23/20 Patient's last menstrual period was 07/10/2019 (within weeks). EGA: [redacted]w[redacted]d  Delivery Note At 10:39 PM a viable female was delivered via Vaginal Spontaneous (Presentation: OA, Right Occiput Anterior).   APGAR: 9, 9; Weight: pending     Placenta status: Spontaneous, Intact.   Cord: 3 vessels with the following complications: None.  Cord pH: NA  Called to see patient.  Mom pushed to deliver a viable female infant.  The head with compound right hand followed by shoulders, which delivered without difficulty, and the rest of the body.  No nuchal cord noted.  Baby to mom's chest.  Cord clamped and cut after 3 min delay.  Cord blood obtained.  Placenta delivered spontaneously, intact, with a 3-vessel cord.  First degree perineal laceration hemostatic, not requiring repair.  All counts correct.  Hemostasis obtained with IV pitocin and fundal massage.   Anesthesia: Epidural Episiotomy: None Lacerations:  1st degree, hemostatic Suture Repair: NA Est. Blood Loss (mL): 200  Mom to postpartum.  Baby to Couplet care / Skin to Skin.  Tresea Mall, CNM 05/18/2020, 11:08 PM

## 2020-04-19 NOTE — L&D Delivery Note (Signed)
Date of delivery: 05/18/2020 Estimated Date of Delivery: 05/23/20 Patient's last menstrual period was 07/10/2019 (within weeks). EGA: [redacted]w[redacted]d  Delivery Note At 10:39 PM a viable female was delivered via Vaginal Spontaneous (Presentation: OA, Right Occiput Anterior).   APGAR: 9, 9; Weight: 3520 g, 7 pounds 12 ounces     Placenta status: Spontaneous, Intact.   Cord: 3 vessels with the following complications: None.  Cord pH: NA  Called to see patient.  Mom pushed to deliver a viable female infant.  The head with compound right hand followed by shoulders, which delivered without difficulty, and the rest of the body.  No nuchal cord noted.  Baby to mom's chest.  Cord clamped and cut after 3 min delay.  Cord blood obtained.  Placenta delivered spontaneously, intact, with a 3-vessel cord.  First degree perineal laceration hemostatic, not requiring repair.  All counts correct.  Hemostasis obtained with IV pitocin and fundal massage.   Anesthesia: Epidural Episiotomy: None Lacerations:  1st degree, hemostatic Suture Repair: NA Est. Blood Loss (mL): 200  Mom to postpartum.  Baby to Couplet care / Skin to Skin.  Tresea Mall, CNM 05/18/2020, 11:08 PM

## 2020-04-20 ENCOUNTER — Encounter: Payer: Self-pay | Admitting: Obstetrics and Gynecology

## 2020-04-20 ENCOUNTER — Observation Stay
Admission: EM | Admit: 2020-04-20 | Discharge: 2020-04-20 | Disposition: A | Discharge status: Home / Self Care | Payer: Medicaid Other | Attending: Obstetrics and Gynecology | Admitting: Obstetrics and Gynecology

## 2020-04-20 ENCOUNTER — Other Ambulatory Visit: Payer: Self-pay

## 2020-04-20 DIAGNOSIS — J45909 Unspecified asthma, uncomplicated: Secondary | ICD-10-CM | POA: Diagnosis not present

## 2020-04-20 DIAGNOSIS — O99333 Smoking (tobacco) complicating pregnancy, third trimester: Secondary | ICD-10-CM | POA: Diagnosis not present

## 2020-04-20 DIAGNOSIS — R109 Unspecified abdominal pain: Secondary | ICD-10-CM

## 2020-04-20 DIAGNOSIS — O4703 False labor before 37 completed weeks of gestation, third trimester: Secondary | ICD-10-CM | POA: Insufficient documentation

## 2020-04-20 DIAGNOSIS — O26893 Other specified pregnancy related conditions, third trimester: Principal | ICD-10-CM | POA: Insufficient documentation

## 2020-04-20 DIAGNOSIS — F17210 Nicotine dependence, cigarettes, uncomplicated: Secondary | ICD-10-CM | POA: Insufficient documentation

## 2020-04-20 DIAGNOSIS — Z3480 Encounter for supervision of other normal pregnancy, unspecified trimester: Secondary | ICD-10-CM

## 2020-04-20 DIAGNOSIS — O99513 Diseases of the respiratory system complicating pregnancy, third trimester: Secondary | ICD-10-CM | POA: Diagnosis not present

## 2020-04-20 DIAGNOSIS — Z3A35 35 weeks gestation of pregnancy: Secondary | ICD-10-CM | POA: Insufficient documentation

## 2020-04-20 DIAGNOSIS — R519 Headache, unspecified: Secondary | ICD-10-CM

## 2020-04-20 LAB — CHLAMYDIA/NGC RT PCR (ARMC ONLY)
Chlamydia Tr: NOT DETECTED
N gonorrhoeae: NOT DETECTED

## 2020-04-20 LAB — URINALYSIS, COMPLETE (UACMP) WITH MICROSCOPIC
Bilirubin Urine: NEGATIVE
Glucose, UA: NEGATIVE mg/dL
Hgb urine dipstick: NEGATIVE
Ketones, ur: NEGATIVE mg/dL
Nitrite: NEGATIVE
Protein, ur: NEGATIVE mg/dL
Specific Gravity, Urine: 1.018 (ref 1.005–1.030)
pH: 6 (ref 5.0–8.0)

## 2020-04-20 NOTE — Final Progress Note (Signed)
Physician Final Progress Note  Patient ID: Suzanne Nelson MRN: [id number] DOB/AGE: 1995/05/09 25 y.o.  Admit date: 04/20/2020 Admitting provider: Will Bonnet, MD Discharge date: 04/20/2020   Admission Diagnoses:  1) intrauterine pregnancy at [redacted]w[redacted]d  2) abdominal pain affecting pregnancy, third trimester  Discharge Diagnoses:  Active Problems:   Supervision of other normal pregnancy, antepartum   Abdominal pain during pregnancy in third trimester   [redacted] weeks gestation of pregnancy  False labor  History of Present Illness: The patient is a 25 y.o. female G3P0020 at [redacted]w[redacted]d who presents for abdominal cramping and pain that has been intermittent in the past week or two, worse yesterday and today. The pain starts in her upper abdomen and extends down into her pelvic floor.  The pain is rated 8/10.  There are no aggravating factors. There are no alleviating factors. She has no associated symptoms. She specifically denies fevers, chills, nausea, vomiting, diarrhea, urinary symptoms (dysuria, frequency, hematuria).  She denies vaginal irritative symptoms of itching, burning, and irritation. She does have some associated back pain.  Past Medical History:  Diagnosis Date  . ADHD (attention deficit hyperactivity disorder)   . Anxiety   . Asthma   . Asthma   . Depression   . Migraine   . No pertinent past medical history   . Panic attack   . Pyelonephritis affecting pregnancy in third trimester     Past Surgical History:  Procedure Laterality Date  . CYSTO N/A 05/19/2015   Procedure: CYSTO;  Surgeon: Hollice Espy, MD;  Location: [facility] ORS;  Service: Urology;  Laterality: N/A;  . STENT PLACE LEFT URETER ([place] HX)    . URETEROSCOPY WITH HOLMIUM LASER LITHOTRIPSY Left 05/19/2015   Procedure: URETEROSCOPY WITH HOLMIUM LASER LITHOTRIPSY;  Surgeon: Hollice Espy, MD;  Location: [facility] ORS;  Service: Urology;  Laterality: Left;    No current facility-administered medications on file prior to  encounter.   Current Outpatient Medications on File Prior to Encounter  Medication Sig Dispense Refill  . Butalbital-APAP-Caffeine 50-325-40 MG capsule Take 1-2 capsules by mouth every 6 (six) hours as needed for headache. 24 capsule 0  . cyclobenzaprine (FLEXERIL) 10 MG tablet Take 1 tablet (10 mg total) by mouth every 8 (eight) hours as needed for muscle spasms. 30 tablet 1  . famotidine (PEPCID) 10 MG tablet Take 10 mg by mouth 2 (two) times daily.    . ondansetron (ZOFRAN) 4 MG tablet Take 1 tablet (4 mg total) by mouth every 6 (six) hours as needed for nausea or vomiting. 30 tablet 0  . Prenatal Vit-Fe Fumarate-FA (MULTIVITAMIN-PRENATAL) 27-0.8 MG TABS tablet Take 1 tablet by mouth daily at 12 noon.    . cephALEXin (KEFLEX) 500 MG capsule Take 1 capsule (500 mg total) by mouth 4 (four) times daily. (Patient not taking: Reported on 04/20/2020) 28 capsule 2  . [DISCONTINUED] albuterol (PROVENTIL HFA;VENTOLIN HFA) 108 (90 Base) MCG/ACT inhaler Inhale 2 puffs into the lungs every 6 (six) hours as needed for wheezing or shortness of breath. 1 Inhaler 0  . [DISCONTINUED] escitalopram (LEXAPRO) 10 MG tablet Take 10 mg by mouth daily.      Allergies  Allergen Reactions  . Onion Swelling  . Adhesive [Tape] Other (See Comments)    Steri-Strips & Silk tape  . Tape Rash    Reaction to Paper tape. Pt denies reaction to tegaderm or surgical tape.    Social History   Socioeconomic History  . Marital status: Single    Spouse name:  Not on file  . Number of children: Not on file  . Years of education: Not on file  . Highest education level: Not on file  Occupational History    Comment: Geologist, engineering at [employer]  Tobacco Use  . Smoking status: Current Every Day Smoker    Packs/day: 0.50    Types: Cigarettes  . Smokeless tobacco: Never Used  . Tobacco comment: quiting now  Vaping Use  . Vaping Use: Former  . Substances: CBD  Substance and Sexual Activity  . Alcohol use: Not Currently     Comment: socially  . Drug use: Not Currently    Frequency: 1.0 times per week    Types: Marijuana    Comment: Last use one month age, uses vape with CBD currently  . Sexual activity: Not Currently    Partners: Male    Birth control/protection: Abstinence    Comment: Pt uses Depo shot had times 1 moth ago  Other Topics Concern  . Not on file  Social History Narrative   ** Merged History Encounter **       ** Merged History Encounter **       Lives at home, independant   Social Determinants of Health   Financial Resource Strain: Not on file  Food Insecurity: Not on file  Transportation Needs: Not on file  Physical Activity: Not on file  Stress: Not on file  Social Connections: Not on file  Intimate Partner Violence: Not on file    Family History  Problem Relation Age of Onset  . Diabetes Mellitus II Mother   . Bipolar disorder Mother   . Breast cancer Paternal Grandmother        not sure of age     Review of Systems  Constitutional: Negative.  Negative for chills and fever.  HENT: Negative.   Eyes: Negative.   Respiratory: Negative.   Cardiovascular: Negative.   Gastrointestinal: Positive for abdominal pain (see HPI). Negative for constipation, diarrhea, nausea and vomiting.  Genitourinary: Negative.  Negative for dysuria, frequency, hematuria and urgency.  Musculoskeletal: Negative.   Skin: Negative.   Neurological: Negative.   Psychiatric/Behavioral: Negative.      Physical Exam: BP 129/78 (BP Location: Left Arm)   Pulse 95   Temp 98.4 F (36.9 C) (Oral)   Resp 18   Ht 5\' 2"  (1.575 m)   Wt 85.7 kg Comment: 189lbs  LMP 07/10/2019 (Within Weeks)   SpO2 99%   BMI 34.57 kg/m   Physical Exam Constitutional:      General: She is not in acute distress.    Appearance: Normal appearance. She is well-developed.  Genitourinary:     Genitourinary Comments: Cervix: closed/thick/high  HENT:     Head: Normocephalic and atraumatic.  Eyes:     General: No  scleral icterus.    Conjunctiva/sclera: Conjunctivae normal.  Cardiovascular:     Rate and Rhythm: Normal rate and regular rhythm.     Heart sounds: No murmur heard. No friction rub. No gallop.   Pulmonary:     Effort: Pulmonary effort is normal. No respiratory distress.     Breath sounds: Normal breath sounds. No wheezing or rales.  Abdominal:     General: Bowel sounds are normal. There is no distension.     Palpations: Abdomen is soft. There is no mass.     Tenderness: There is no abdominal tenderness. There is no guarding or rebound.  Musculoskeletal:        General: Normal range of  motion.     Cervical back: Normal range of motion and neck supple.  Neurological:     General: No focal deficit present.     Mental Status: She is alert and oriented to person, place, and time.     Cranial Nerves: No cranial nerve deficit.  Skin:    General: Skin is warm and dry.     Findings: No erythema.  Psychiatric:        Mood and Affect: Mood normal.        Behavior: Behavior normal.        Judgment: Judgment normal.   Female chaperone present for pelvic exam:   Consults: None  Significant Findings/ Diagnostic Studies:  Lab Results  Component Value Date   APPEARANCEUR HAZY (A) 04/20/2020   GLUCOSEU NEGATIVE 04/20/2020   BILIRUBINUR NEGATIVE 04/20/2020   KETONESUR NEGATIVE 04/20/2020   LABSPEC 1.018 04/20/2020   HGBUR NEGATIVE 04/20/2020   PHURINE 6.0 04/20/2020   NITRITE NEGATIVE 04/20/2020   LEUKOCYTESUR SMALL (A) 04/20/2020   WBCU 6-10 04/20/2020   BACTERIA RARE (A) 04/20/2020   EPIU 0-5 04/20/2020    Lab Results  Component Value Date   CHLAMYDIA NOT DETECTED 04/20/2020   NGONORRHOEAE NOT DETECTED 04/20/2020    Procedures:  NST: Baseline FHR: 130 beats/min Variability: moderate Accelerations: present Decelerations: absent Tocometry: irritability  Interpretation:  INDICATIONS: rule out uterine contractions RESULTS:  A NST procedure was performed with FHR monitoring  and a normal baseline established, appropriate time of 20-40 minutes of evaluation, and accels >2 seen w 15x15 characteristics.  Results show a REACTIVE NST.    Hospital Course: The patient was admitted to Labor and Delivery Triage for observation. She had normal vital signs.  Her exam was reassuring. Her cervix was closed. There is no evidence of UTI, no STDs.  The fetal tracing was reactive.  She had no etiology that would explain her pain apart from pregnancy-related discomforts.  She was reassured by the findings and has close follow up.   Discharge Condition: stable  Disposition: Discharge disposition: 01-Home or Self Care       Diet: Regular diet  Discharge Activity: Activity as tolerated   Allergies as of 04/20/2020      Reactions   Onion Swelling   Adhesive [tape] Other (See Comments)   Steri-Strips & Silk tape   Tape Rash   Reaction to Paper tape. Pt denies reaction to tegaderm or surgical tape.      Medication List    STOP taking these medications   cephALEXin 500 MG capsule Commonly known as: KEFLEX     TAKE these medications   Butalbital-APAP-Caffeine 50-325-40 MG capsule Take 1-2 capsules by mouth every 6 (six) hours as needed for headache.   cyclobenzaprine 10 MG tablet Commonly known as: FLEXERIL Take 1 tablet (10 mg total) by mouth every 8 (eight) hours as needed for muscle spasms.   famotidine 10 MG tablet Commonly known as: PEPCID Take 10 mg by mouth 2 (two) times daily.   multivitamin-prenatal 27-0.8 MG Tabs tablet Take 1 tablet by mouth daily at 12 noon.   ondansetron 4 MG tablet Commonly known as: Zofran Take 1 tablet (4 mg total) by mouth every 6 (six) hours as needed for nausea or vomiting.        Total time spent taking care of this patient: 25 minutes  Signed: Thomasene Mohair, MD  04/20/2020, 8:52 PM

## 2020-04-20 NOTE — Discharge Summary (Signed)
Discharge education provided to patient and patient verbalized understanding. Patient discharged home with her mother. Red flag symptoms reviewed.

## 2020-04-20 NOTE — OB Triage Note (Signed)
Patient presents to OBS 3 with complaint of lower abdominal cramping, back (cramping)pain that extends from shoulder blades to lower back, sharp shooting pain that extends from upper abdomen to her vagina. She states that this pain began gradually around 12 midnight. She denies any recent heavy lifting or sexual intercourse, no LOF, no bleeding.  She reports having to urinate more frequently and feeling like she still needs to go after she just urinated.  She reports that she took a muscle relaxer this morning that didn't help with the pain at all. She reports that baby has been moving well.

## 2020-04-20 NOTE — Discharge Summary (Signed)
See Final Progress Note 

## 2020-04-23 ENCOUNTER — Other Ambulatory Visit (HOSPITAL_COMMUNITY)
Admission: RE | Admit: 2020-04-23 | Discharge: 2020-04-23 | Disposition: A | Discharge status: Home / Self Care | Payer: Medicaid Other | Source: Ambulatory Visit | Attending: Advanced Practice Midwife | Admitting: Advanced Practice Midwife

## 2020-04-23 ENCOUNTER — Other Ambulatory Visit: Payer: Self-pay

## 2020-04-23 ENCOUNTER — Ambulatory Visit (INDEPENDENT_AMBULATORY_CARE_PROVIDER_SITE_OTHER): Payer: Medicaid Other | Admitting: Advanced Practice Midwife

## 2020-04-23 ENCOUNTER — Encounter: Payer: Self-pay | Admitting: Advanced Practice Midwife

## 2020-04-23 VITALS — BP 114/70 | Wt 193.6 lb

## 2020-04-23 DIAGNOSIS — Z3A35 35 weeks gestation of pregnancy: Secondary | ICD-10-CM | POA: Insufficient documentation

## 2020-04-23 DIAGNOSIS — Z3685 Encounter for antenatal screening for Streptococcus B: Secondary | ICD-10-CM

## 2020-04-23 DIAGNOSIS — Z113 Encounter for screening for infections with a predominantly sexual mode of transmission: Secondary | ICD-10-CM

## 2020-04-23 DIAGNOSIS — Z3483 Encounter for supervision of other normal pregnancy, third trimester: Secondary | ICD-10-CM

## 2020-04-23 LAB — POCT URINALYSIS DIPSTICK OB
Glucose, UA: NEGATIVE
POC,PROTEIN,UA: NEGATIVE

## 2020-04-23 NOTE — Progress Notes (Signed)
Routine Prenatal Care Visit  Subjective  Suzanne Nelson is a 25 y.o. G3P0020 at [redacted]w[redacted]d being seen today for ongoing prenatal care.  She is currently monitored for the following issues for this low-risk pregnancy and has Major depressive disorder, single episode, moderate (HCC); Attention deficit hyperactivity disorder, combined type; Oppositional defiant disorder; Hydronephrosis; Calculus of distal left ureter; Left ureteral stone; Supervision of other normal pregnancy, antepartum; BV (bacterial vaginosis); Labor and delivery, indication for care; Dysuria during pregnancy in third trimester; Pelvic pain affecting pregnancy in third trimester, antepartum; Headache in pregnancy, antepartum, third trimester; Labor and delivery indication for care or intervention; Pyelonephritis affecting pregnancy in third trimester; Back pain affecting pregnancy in third trimester; Abdominal pain during pregnancy in third trimester; and [redacted] weeks gestation of pregnancy on their problem list.  ----------------------------------------------------------------------------------- Patient reports she is doing well and denies concerns for abdominal pain today. She desires 39 week induction- GBS/aptima collected today.   Contractions: Not present. Vag. Bleeding: None.  Movement: Present. Leaking Fluid denies.  ----------------------------------------------------------------------------------- The following portions of the patient's history were reviewed and updated as appropriate: allergies, current medications, past family history, past medical history, past social history, past surgical history and problem list. Problem list updated.  Objective  Blood pressure 114/70, weight 193 lb 9.6 oz (87.8 kg), last menstrual period 07/10/2019. Pregravid weight 130 lb (59 kg) Total Weight Gain 63 lb 9.6 oz (28.8 kg) Urinalysis: Urine Protein    Urine Glucose    Fetal Status: Fetal Heart Rate (bpm): 133 Fundal Height: 36 cm Movement:  Present     General:  Alert, oriented and cooperative. Patient is in no acute distress.  Skin: Skin is warm and dry. No rash noted.   Cardiovascular: Normal heart rate noted  Respiratory: Normal respiratory effort, no problems with respiration noted  Abdomen: Soft, gravid, appropriate for gestational age. Pain/Pressure: Absent     Pelvic:  GBS/aptima collected        Extremities: Normal range of motion.     Mental Status: Normal mood and affect. Normal behavior. Normal judgment and thought content.   Assessment   25 y.o. G3P0020 at [redacted]w[redacted]d by  05/23/2020, Date entered prior to episode creation presenting for routine prenatal visit  Plan   THIRD Problems (from 10/17/19 to present)    Problem Noted Resolved   Headache in pregnancy, antepartum, third trimester 03/28/2020 by Zipporah Plants, CNM No   Supervision of other normal pregnancy, antepartum 10/17/2019 by Mirna Mires, CNM No   Overview Addendum 04/16/2020  4:37 PM by Vena Austria, MD     Nursing Staff Provider  Office Location  [facility] Dating   6 wk [place]  Language  English Anatomy US  normal  Flu Vaccine  Declined Genetic Screen  NIPS: normal xx   TDaP vaccine   03/20/2020 Hgb A1C or  GTT Third trimester : 106  Rhogam   not needed   LAB RESULTS   Feeding Plan Pumping, desires to mostly bottle feed/ formula Blood Type --/--/O POS Performed at [facility], 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   Return in about 1 week (around 04/30/2020) for rob.   Tresea Mall, CNM 04/23/2020 4:23 PM

## 2020-04-23 NOTE — Addendum Note (Signed)
Addended by: Clement Husbands A on: 04/23/2020 04:45 PM   Modules accepted: Orders

## 2020-04-25 LAB — STREP GP B NAA: Strep Gp B NAA: NEGATIVE

## 2020-04-25 LAB — CERVICOVAGINAL ANCILLARY ONLY
Chlamydia: NEGATIVE
Comment: NEGATIVE
Comment: NEGATIVE
Comment: NORMAL
Neisseria Gonorrhea: NEGATIVE
Trichomonas: NEGATIVE

## 2020-04-27 ENCOUNTER — Other Ambulatory Visit: Payer: Self-pay

## 2020-04-27 ENCOUNTER — Other Ambulatory Visit: Payer: Medicaid Other

## 2020-04-27 DIAGNOSIS — Z20822 Contact with and (suspected) exposure to covid-19: Secondary | ICD-10-CM

## 2020-04-29 ENCOUNTER — Emergency Department
Admission: EM | Admit: 2020-04-29 | Discharge: 2020-04-29 | Disposition: A | Discharge status: Home / Self Care | Payer: Medicaid Other | Attending: Emergency Medicine | Admitting: Emergency Medicine

## 2020-04-29 ENCOUNTER — Other Ambulatory Visit: Payer: Self-pay

## 2020-04-29 DIAGNOSIS — Z5321 Procedure and treatment not carried out due to patient leaving prior to being seen by health care provider: Secondary | ICD-10-CM | POA: Insufficient documentation

## 2020-04-29 DIAGNOSIS — O26893 Other specified pregnancy related conditions, third trimester: Secondary | ICD-10-CM | POA: Insufficient documentation

## 2020-04-29 DIAGNOSIS — R040 Epistaxis: Secondary | ICD-10-CM | POA: Diagnosis not present

## 2020-04-29 DIAGNOSIS — Z3A36 36 weeks gestation of pregnancy: Secondary | ICD-10-CM | POA: Insufficient documentation

## 2020-04-29 LAB — NOVEL CORONAVIRUS, NAA: SARS-CoV-2, NAA: NOT DETECTED

## 2020-04-29 LAB — SARS-COV-2, NAA 2 DAY TAT

## 2020-04-29 NOTE — ED Notes (Signed)
+   FHT= 148 bpm

## 2020-04-29 NOTE — ED Triage Notes (Signed)
Pt in with co nosebleed x 2 today. Pt with nose clamp in place at this time. She is [redacted] weeks pregnant with no pregnancy complaints.

## 2020-04-30 ENCOUNTER — Encounter: Payer: Self-pay | Admitting: Obstetrics and Gynecology

## 2020-04-30 ENCOUNTER — Ambulatory Visit (INDEPENDENT_AMBULATORY_CARE_PROVIDER_SITE_OTHER): Payer: Medicaid Other | Admitting: Obstetrics and Gynecology

## 2020-04-30 VITALS — BP 110/68 | Wt 197.0 lb

## 2020-04-30 DIAGNOSIS — Z3A36 36 weeks gestation of pregnancy: Secondary | ICD-10-CM

## 2020-04-30 DIAGNOSIS — L299 Pruritus, unspecified: Secondary | ICD-10-CM

## 2020-04-30 DIAGNOSIS — O2603 Excessive weight gain in pregnancy, third trimester: Secondary | ICD-10-CM | POA: Insufficient documentation

## 2020-04-30 DIAGNOSIS — Z3480 Encounter for supervision of other normal pregnancy, unspecified trimester: Secondary | ICD-10-CM

## 2020-04-30 LAB — POCT URINALYSIS DIPSTICK OB
Glucose, UA: NEGATIVE
POC,PROTEIN,UA: NEGATIVE

## 2020-04-30 NOTE — Progress Notes (Signed)
Routine Prenatal Care Visit  Subjective  Suzanne Nelson is a 25 y.o. G3P0020 at [redacted]w[redacted]d being seen today for ongoing prenatal care.  She is currently monitored for the following issues for this low-risk pregnancy and has Major depressive disorder, single episode, moderate (HCC); Attention deficit hyperactivity disorder, combined type; Oppositional defiant disorder; Hydronephrosis; Calculus of distal left ureter; Left ureteral stone; Supervision of other normal pregnancy, antepartum; BV (bacterial vaginosis); Labor and delivery, indication for care; Dysuria during pregnancy in third trimester; Pelvic pain affecting pregnancy in third trimester, antepartum; Headache in pregnancy, antepartum, third trimester; Labor and delivery indication for care or intervention; Pyelonephritis affecting pregnancy in third trimester; Back pain affecting pregnancy in third trimester; Abdominal pain during pregnancy in third trimester; and Excessive weight gain during pregnancy in third trimester on their problem list.  ----------------------------------------------------------------------------------- Patient reports persistent itching over the last 4-5 days that has interrupted her sleeping patterns. Denies further concerns at this time. Contractions: Irritability. Vag. Bleeding: None.  Movement: Present. Denies leaking of fluid.  ----------------------------------------------------------------------------------- The following portions of the patient's history were reviewed and updated as appropriate: allergies, current medications, past family history, past medical history, past social history, past surgical history and problem list. Problem list updated.   Objective  Blood pressure 110/68, weight 197 lb (89.4 kg), last menstrual period 07/10/2019. Pregravid weight 130 lb (59 kg) Total Weight Gain 67 lb (30.4 kg) Urinalysis:      Fetal Status: Fetal Heart Rate (bpm): 140 Fundal Height: 37 cm Movement: Present   Presentation: Vertex  General:  Alert, oriented and cooperative. Patient is in no acute distress.  Skin: Skin is warm and dry. No rash noted.   Cardiovascular: Normal heart rate noted  Respiratory: Normal respiratory effort, no problems with respiration noted  Abdomen: Soft, gravid, appropriate for gestational age. Pain/Pressure: Absent     Pelvic:  Cervical exam deferred        Extremities: Normal range of motion.     ental Status: Normal mood and affect. Normal behavior. Normal judgment and thought content.     Assessment   25 y.o. G3P0020 at [redacted]w[redacted]d by  05/23/2020, Date entered prior to episode creation presenting for routine prenatal visit  Plan   THIRD Problems (from 10/17/19 to present)    Problem Noted Resolved   Headache in pregnancy, antepartum, third trimester 03/28/2020 by Zipporah Plants, CNM No   Supervision of other normal pregnancy, antepartum 10/17/2019 by Mirna Mires, CNM No   Overview Addendum 04/16/2020  4:37 PM by Vena Austria, MD     Nursing Staff Provider  Office Location  [facility] Dating   6 wk [place]  Language  English Anatomy US  normal  Flu Vaccine  Declined Genetic Screen  NIPS: normal xx   TDaP vaccine   03/20/2020 Hgb A1C or  GTT Third trimester : 106  Rhogam   not needed   LAB RESULTS   Feeding Plan Pumping, desires to mostly bottle feed/ formula Blood Type --/--/O POS Performed at [facility], [address] [place], [place] [address]  [phone])   Contraception  Antibody  negative  Circumcision  Rubella  Immune  Pediatrician   RPR NON REACTIVE (06/11 0918)   Support Person  HBsAg     Prenatal Classes  HIV Non Reactive (06/11 0918)    Varicella immune  BTL Consent  GBS  (For PCN allergy, check sensitivities)        VBAC Consent  Pap   2021 NIL  Hgb Electro   not needed  Covid Declines CF - carrier     SMA  normal               Previous Version      -Bile acids and CMP today for persistent itching  Term labor  precautions including but not limited to vaginal bleeding, contractions, leaking of fluid and fetal movement were reviewed in detail with the patient.    Return in about 1 week (around 05/07/2020) for ROB.  Zipporah Plants, CNM, MSN [facility], [facility] 04/30/2020, 4:44 PM

## 2020-05-02 LAB — COMPREHENSIVE METABOLIC PANEL
ALT: 10 IU/L (ref 0–32)
AST: 12 IU/L (ref 0–40)
Albumin/Globulin Ratio: 1.5 (ref 1.2–2.2)
Albumin: 3.4 g/dL — ABNORMAL LOW (ref 3.9–5.0)
Alkaline Phosphatase: 191 IU/L — ABNORMAL HIGH (ref 44–121)
BUN/Creatinine Ratio: 11 (ref 9–23)
BUN: 5 mg/dL — ABNORMAL LOW (ref 6–20)
Bilirubin Total: 0.4 mg/dL (ref 0.0–1.2)
CO2: 22 mmol/L (ref 20–29)
Calcium: 8.6 mg/dL — ABNORMAL LOW (ref 8.7–10.2)
Chloride: 103 mmol/L (ref 96–106)
Creatinine, Ser: 0.45 mg/dL — ABNORMAL LOW (ref 0.57–1.00)
GFR calc Af Amer: 161 mL/min/{1.73_m2} (ref >59)
GFR calc non Af Amer: 140 mL/min/{1.73_m2} (ref >59)
Globulin, Total: 2.2 g/dL (ref 1.5–4.5)
Glucose: 72 mg/dL (ref 65–99)
Potassium: 4.2 mmol/L (ref 3.5–5.2)
Sodium: 138 mmol/L (ref 134–144)
Total Protein: 5.6 g/dL — ABNORMAL LOW (ref 6.0–8.5)

## 2020-05-02 LAB — BILE ACIDS, TOTAL: Bile Acids Total: 2.9 umol/L (ref 0.0–10.0)

## 2020-05-05 ENCOUNTER — Encounter: Payer: Self-pay | Admitting: Obstetrics and Gynecology

## 2020-05-05 ENCOUNTER — Other Ambulatory Visit: Payer: Self-pay

## 2020-05-05 ENCOUNTER — Observation Stay
Admission: EM | Admit: 2020-05-05 | Discharge: 2020-05-05 | Disposition: A | Discharge status: Home / Self Care | Payer: Medicaid Other | Attending: Obstetrics and Gynecology | Admitting: Obstetrics and Gynecology

## 2020-05-05 DIAGNOSIS — M545 Low back pain, unspecified: Secondary | ICD-10-CM | POA: Insufficient documentation

## 2020-05-05 DIAGNOSIS — Z3480 Encounter for supervision of other normal pregnancy, unspecified trimester: Secondary | ICD-10-CM

## 2020-05-05 DIAGNOSIS — Z3A37 37 weeks gestation of pregnancy: Secondary | ICD-10-CM

## 2020-05-05 DIAGNOSIS — O26893 Other specified pregnancy related conditions, third trimester: Secondary | ICD-10-CM | POA: Diagnosis present

## 2020-05-05 DIAGNOSIS — R1030 Lower abdominal pain, unspecified: Secondary | ICD-10-CM | POA: Diagnosis not present

## 2020-05-05 DIAGNOSIS — O99891 Other specified diseases and conditions complicating pregnancy: Secondary | ICD-10-CM | POA: Diagnosis not present

## 2020-05-05 DIAGNOSIS — F17210 Nicotine dependence, cigarettes, uncomplicated: Secondary | ICD-10-CM | POA: Diagnosis not present

## 2020-05-05 DIAGNOSIS — R519 Headache, unspecified: Secondary | ICD-10-CM

## 2020-05-05 DIAGNOSIS — J45909 Unspecified asthma, uncomplicated: Secondary | ICD-10-CM | POA: Diagnosis not present

## 2020-05-05 DIAGNOSIS — O99333 Smoking (tobacco) complicating pregnancy, third trimester: Secondary | ICD-10-CM | POA: Diagnosis not present

## 2020-05-05 DIAGNOSIS — R109 Unspecified abdominal pain: Secondary | ICD-10-CM | POA: Diagnosis not present

## 2020-05-05 DIAGNOSIS — M549 Dorsalgia, unspecified: Secondary | ICD-10-CM

## 2020-05-05 MED ORDER — CYCLOBENZAPRINE HCL 10 MG PO TABS: 10.0000 mg | ORAL_TABLET | Freq: Two times a day (BID) | ORAL | 1 refills | Status: DC | PRN

## 2020-05-05 NOTE — Progress Notes (Signed)
Discharge home with mother. Labor precautions discussed. Questions answered. Left floor ambulatory. Elaina Hoops

## 2020-05-05 NOTE — OB Triage Note (Signed)
Abdominal pain x 3 days with lower back pain. Has tried Tylenol with no relief. Denies vaginal bleeding. Reports + fetal movement. Denies LOF. Elaina Hoops

## 2020-05-05 NOTE — Discharge Instructions (Signed)
Epsom salt tub soaks Tylenol PM as needed for sleep/pain

## 2020-05-05 NOTE — Discharge Summary (Signed)
Physician Final Progress Note  Patient ID: Suzanne Nelson MRN: [id number] DOB/AGE: 1996/02/26 25 y.o.  Admit date: 05/05/2020 Admitting provider: Tresea Mall, CNM Discharge date: 05/05/2020   Admission Diagnoses: back pain, abdominal pain  Discharge Diagnoses:  Active Problems:   Labor and delivery, indication for care   Back pain affecting pregnancy in third trimester   Abdominal pain during pregnancy in third trimester   [redacted] weeks gestation of pregnancy    History of Present Illness: The patient is a 25 y.o. female G3P0020 at [redacted]w[redacted]d who presents for lower abdominal pain and low back pain that has been constant over the past 3 days. She denies symptoms of UTI or vaginitis. She has tried tylenol without relief. She is unable to soak in the bathtub due to mobility issues. She is not sleeping well. She understands that her 39 week elective induction can be scheduled at her prenatal appointment tomorrow.   The patient was admitted for observation and placed on monitors. Cervical exam performed. She does not appear to be in labor. Monitoring is reassuring for reactive NST and uterine iritability. The patient is discharged to home with precautions and instructions for comfort measures.  Past Medical History:  Diagnosis Date  . ADHD (attention deficit hyperactivity disorder)   . Anxiety   . Asthma   . Asthma   . Depression   . Migraine   . No pertinent past medical history   . Panic attack   . Pyelonephritis affecting pregnancy in third trimester     Past Surgical History:  Procedure Laterality Date  . CYSTO N/A 05/19/2015   Procedure: CYSTO;  Surgeon: Vanna Scotland, MD;  Location: [facility] ORS;  Service: Urology;  Laterality: N/A;  . STENT PLACE LEFT URETER ([facility] HX)    . URETEROSCOPY WITH HOLMIUM LASER LITHOTRIPSY Left 05/19/2015   Procedure: URETEROSCOPY WITH HOLMIUM LASER LITHOTRIPSY;  Surgeon: Vanna Scotland, MD;  Location: [facility] ORS;  Service: Urology;  Laterality: Left;    No  current facility-administered medications on file prior to encounter.   Current Outpatient Medications on File Prior to Encounter  Medication Sig Dispense Refill  . Butalbital-APAP-Caffeine 50-325-40 MG capsule Take 1-2 capsules by mouth every 6 (six) hours as needed for headache. 24 capsule 0  . ondansetron (ZOFRAN) 4 MG tablet Take 1 tablet (4 mg total) by mouth every 6 (six) hours as needed for nausea or vomiting. 30 tablet 0  . Prenatal Vit-Fe Fumarate-FA (MULTIVITAMIN-PRENATAL) 27-0.8 MG TABS tablet Take 1 tablet by mouth daily at 12 noon.    . cyclobenzaprine (FLEXERIL) 10 MG tablet Take 1 tablet (10 mg total) by mouth every 8 (eight) hours as needed for muscle spasms. (Patient not taking: No sig reported) 30 tablet 1  . famotidine (PEPCID) 10 MG tablet Take 10 mg by mouth 2 (two) times daily. (Patient not taking: No sig reported)    . [DISCONTINUED] albuterol (PROVENTIL HFA;VENTOLIN HFA) 108 (90 Base) MCG/ACT inhaler Inhale 2 puffs into the lungs every 6 (six) hours as needed for wheezing or shortness of breath. 1 Inhaler 0  . [DISCONTINUED] escitalopram (LEXAPRO) 10 MG tablet Take 10 mg by mouth daily.      Allergies  Allergen Reactions  . Onion Swelling  . Adhesive [Tape] Other (See Comments)    Steri-Strips & Silk tape  . Tape Rash    Reaction to Paper tape. Pt denies reaction to tegaderm or surgical tape.    Social History   Socioeconomic History  . Marital status: Single  Spouse name: Not on file  . Number of children: Not on file  . Years of education: Not on file  . Highest education level: Not on file  Occupational History    Comment: Geologist, engineeringsales clerk at [employer]  Tobacco Use  . Smoking status: Current Every Day Smoker    Packs/day: 0.50    Types: Cigarettes  . Smokeless tobacco: Never Used  . Tobacco comment: quiting now  Vaping Use  . Vaping Use: Former  . Substances: CBD  Substance and Sexual Activity  . Alcohol use: Not Currently    Comment: socially  .  Drug use: Not Currently    Frequency: 1.0 times per week    Types: Marijuana    Comment: Last use one month age, uses vape with CBD currently  . Sexual activity: Not Currently    Partners: Male    Birth control/protection: Abstinence  Other Topics Concern  . Not on file  Social History Narrative   ** Merged History Encounter **       ** Merged History Encounter **       Lives at home, independant   Social Determinants of Health   Financial Resource Strain: Not on file  Food Insecurity: Not on file  Transportation Needs: Not on file  Physical Activity: Not on file  Stress: Not on file  Social Connections: Not on file  Intimate Partner Violence: Not on file    Family History  Problem Relation Age of Onset  . Diabetes Mellitus II Mother   . Bipolar disorder Mother   . Breast cancer Paternal Grandmother        not sure of age     Review of Systems  Constitutional: Negative for chills and fever.  HENT: Negative for congestion, ear discharge, ear pain, hearing loss, sinus pain and sore throat.   Eyes: Negative for blurred vision and double vision.  Respiratory: Negative for cough, shortness of breath and wheezing.   Cardiovascular: Negative for chest pain, palpitations and leg swelling.  Gastrointestinal: Positive for abdominal pain. Negative for blood in stool, constipation, diarrhea, heartburn, melena, nausea and vomiting.  Genitourinary: Negative for dysuria, flank pain, frequency, hematuria and urgency.  Musculoskeletal: Positive for back pain. Negative for joint pain and myalgias.  Skin: Negative for itching and rash.  Neurological: Negative for dizziness, tingling, tremors, sensory change, speech change, focal weakness, seizures, loss of consciousness, weakness and headaches.  Endo/Heme/Allergies: Negative for environmental allergies. Does not bruise/bleed easily.  Psychiatric/Behavioral: Negative for depression, hallucinations, memory loss, substance abuse and suicidal  ideas. The patient is not nervous/anxious and does not have insomnia.      Physical Exam: BP 129/70 (BP Location: Right Arm)   Pulse (!) 104   Temp 98.4 F (36.9 C) (Oral)   Resp 18   Ht 5\' 2"  (1.575 m)   Wt 85.7 kg   LMP 07/10/2019 (Within Weeks)   BMI 34.57 kg/m   Constitutional: Well nourished, well developed female in no acute distress.  HEENT: normal Skin: Warm and dry.  Cardiovascular: Regular rate and rhythm.   Extremity: no edema  Respiratory: Clear to auscultation bilateral. Normal respiratory effort Abdomen: FHT present, mild to palpation Back: no CVAT Neuro: DTRs 2+, Cranial nerves grossly intact Psych: Alert and Oriented x3. No memory deficits. Normal mood and affect.  MS: normal gait, normal bilateral lower extremity ROM/strength/stability.  Pelvic exam: (female chaperone present) is not limited by body habitus EGBUS: within normal limits Vagina: within normal limits and with normal mucosa  Cervix: 1.5/40-50/-2  Toco: irritability Fetal well being: 140 bpm, moderate variability, +accelerations, -decelerations   Consults: None  Significant Findings/ Diagnostic Studies: none  Procedures: NST  Hospital Course: The patient was admitted to Labor and Delivery Triage for observation.   Discharge Condition: good  Disposition: Discharge disposition: 01-Home or Self Care  Diet: Regular diet  Discharge Activity: Activity as tolerated  Discharge Instructions    Discharge activity:  No Restrictions   Complete by: As directed    Discharge diet:  No restrictions   Complete by: As directed    LABOR:  When conractions begin, you should start to time them from the beginning of one contraction to the beginning  of the next.  When contractions are 5 - 10 minutes apart or less and have been regular for at least an hour, you should call your health care provider.   Complete by: As directed    No sexual activity restrictions   Complete by: As directed    Notify  physician for bleeding from the vagina   Complete by: As directed    Notify physician for blurring of vision or spots before the eyes   Complete by: As directed    Notify physician for chills or fever   Complete by: As directed    Notify physician for fainting spells, "black outs" or loss of consciousness   Complete by: As directed    Notify physician for increase in vaginal discharge   Complete by: As directed    Notify physician for leaking of fluid   Complete by: As directed    Notify physician for pain or burning when urinating   Complete by: As directed    Notify physician for pelvic pressure (sudden increase)   Complete by: As directed    Notify physician for severe or continued nausea or vomiting   Complete by: As directed    Notify physician for sudden gushing of fluid from the vagina (with or without continued leaking)   Complete by: As directed    Notify physician for sudden, constant, or occasional abdominal pain   Complete by: As directed    Notify physician if baby moving less than usual   Complete by: As directed      Allergies as of 05/05/2020      Reactions   Onion Swelling   Adhesive [tape] Other (See Comments)   Steri-Strips & Silk tape   Tape Rash   Reaction to Paper tape. Pt denies reaction to tegaderm or surgical tape.      Medication List    STOP taking these medications   famotidine 10 MG tablet Commonly known as: PEPCID     TAKE these medications   Butalbital-APAP-Caffeine 50-325-40 MG capsule Take 1-2 capsules by mouth every 6 (six) hours as needed for headache.   cyclobenzaprine 10 MG tablet Commonly known as: FLEXERIL Take 1 tablet (10 mg total) by mouth 2 (two) times daily as needed for muscle spasms. What changed: when to take this   multivitamin-prenatal 27-0.8 MG Tabs tablet Take 1 tablet by mouth daily at 12 noon.   ondansetron 4 MG tablet Commonly known as: Zofran Take 1 tablet (4 mg total) by mouth every 6 (six) hours as needed  for nausea or vomiting.       Follow-up Information    [facility]. Go to.   Specialty: Obstetrics and Gynecology Why: scheduled prenatal appointment Contact information: [address] [place] [address] [phone]  Total time spent taking care of this patient: 25 minutes  Signed: Tresea Mall, CNM  05/05/2020, 1:56 PM

## 2020-05-06 ENCOUNTER — Ambulatory Visit (INDEPENDENT_AMBULATORY_CARE_PROVIDER_SITE_OTHER): Payer: Medicaid Other | Admitting: Obstetrics and Gynecology

## 2020-05-06 ENCOUNTER — Encounter: Payer: Self-pay | Admitting: Obstetrics and Gynecology

## 2020-05-06 VITALS — BP 110/66 | Wt 203.0 lb

## 2020-05-06 DIAGNOSIS — O2603 Excessive weight gain in pregnancy, third trimester: Secondary | ICD-10-CM

## 2020-05-06 DIAGNOSIS — Z3A37 37 weeks gestation of pregnancy: Secondary | ICD-10-CM

## 2020-05-06 DIAGNOSIS — Z3483 Encounter for supervision of other normal pregnancy, third trimester: Secondary | ICD-10-CM

## 2020-05-06 NOTE — Progress Notes (Signed)
  [facility] INDUCTION ASSESSMENT SCHEDULING TAYTUM SCHECK 1996/02/24 Medical record #: [id number] Phone #:  Home Phone [phone]  Mobile [phone]    Prenatal Provider:[facility]ivering [facility] Proposed admission date/time: 05/18/20 at 0800 Method of induction:Cytotec  Weight: Filed Weights01/18/22 1457Weight:203 lb (92.1 kg) BMI Body mass index is 37.13 kg/m. HIV Negative HSV Negative EDC Estimated Date of Delivery: 2/4/22based on:US at [redacted] wks  Gestational age on admission: 39w2 Gravidity/parity:G3P0020  Cervix Score   0 1 2 3   Position Posterior Midposition Anterior   Consistency Firm Medium Soft   Effacement (%) 0-30 40-50 60-70 >80  Dilation (cm) Closed 1-2 3-4 >5  Baby's station -3 -2 -1 +1, +2   Bishop Score:4   Medical induction of labor  select indication(s) below Elective induction ?39 weeks multiparous patient ?39 weeks primiparous patient with Bishop score ?7 ?40 weeks primiparous patient   Medical Indications Adapted from ACOG Committee Opinion #[id number], "Medically Indicated Late Preterm and Early Term Deliveries," 2013.  PLACENTAL / UTERINE ISSUES FETAL ISSUES MATERNAL ISSUES  ? Placenta previa (36.0-37.6) ? Isoimmunization (37.0-38.6) ? Preeclampsia without severe features or gestational HTN (37.0)  ? Suspected accreta (34.0-35.6) ? Growth Restriction 06-10-1984) ? Preeclampsia with severe features (34.0)  ? Prior classical CD, uterine window, rupture (36.0-37.6) ? Isolated (38.0-39.6) ? Chronic HTN (38.0-39.6)  ? Prior myomectomy (37.0-38.6) ? Concurrent findings (34.0-37.6) ? Cholestasis (37.0)  ? Umbilical vein varix (37.0) ? Growth Restriction (Twins) ? Diabetes  ? Placental abruption (chronic) ? Di-Di Isolated (36.0-37.6) ? Pregestational, controlled (39.0)  OBSTETRIC ISSUES ? Di-Di concurrent findings (32.0-34.6) ? Pregestational, uncontrolled (37.0-39.0)  ? Postdates ? (41 weeks) ? Mo-Di isolated (32.0-34.6)  ? Pregestational, vascular compromise (37.0- 39.0)  ? PPROM (34.0) ? Multiple Gestation ? Gestational, diet controlled (40.0)  ? Hx of IUFD (39.0 weeks) ? Di-Di (38.0-38.6) ? Gestational, med controlled (39.0)  ? Polyhydramnios, mild/moderate; SDV 8-16 or AFI 25-35 (39.0) ? Mo-Di (36.0-37.6) ? Gestational, uncontrolled (38.0-39.0)  ? Oligohydramnios (36.0-37.6); MVP <2 cm  For indications not listed above, delivery recommendations from maternal-fetal medicine consultant occurred on: Date: n/a   Provider Signature: Jillyan Plitt Scheduled by: 07-06-1986 RN Date:05/06/2020 3:52 PM   Call [phone] to finalize the induction date/time  [phone] (07/17)

## 2020-05-06 NOTE — Patient Instructions (Signed)
PRE ADMISSION TESTING For Covid, prior to procedure Friday 9:00-10:00 [facility] entrance (drive up)  Results in 01-09 hours You will not receive notification if test results are negative. If positive for Covid19, your provider will notify you by phone, with additional instructions.   Labor Induction Labor induction is when steps are taken to cause a pregnant woman to begin the labor process. Most women go into labor on their own between 37 weeks and 42 weeks of pregnancy. When this does not happen, or when there is a medical need for labor to begin, steps may be taken to induce, or bring on, labor. Labor induction causes a pregnant woman's uterus to contract. It also causes the cervix to soften (ripen), open (dilate), and thin out. Usually, labor is not induced before 39 weeks of pregnancy unless there is a medical reason to do so. When is labor induction considered? Labor induction may be right for you if:  Your pregnancy lasts longer than 41 to 42 weeks.  Your placenta is separating from your uterus (placental abruption).  You have a rupture of membranes and your labor does not begin.  You have health problems, like diabetes or high blood pressure (preeclampsia) during your pregnancy.  Your baby has stopped growing or does not have enough amniotic fluid. Before labor induction begins, your health care provider will consider the following factors:  Your medical condition and the baby's condition.  How many weeks you have been pregnant.  How mature the baby's lungs are.  The condition of your cervix.  The position of the baby.  The size of your birth canal. Tell a health care provider about:  Any allergies you have.  All medicines you are taking, including vitamins, herbs, eye drops, creams, and over-the-counter medicines.  Any problems you or your family members have had with anesthetic medicines.  Any surgeries you have had.  Any blood disorders you  have.  Any medical conditions you have. What are the risks? Generally, this is a safe procedure. However, problems may occur, including:  Failed induction.  Changes in fetal heart rate, such as being too high, too low, or irregular (erratic).  Infection in the mother or the baby.  Increased risk of having a cesarean delivery.  Breaking off (abruption) of the placenta from the uterus. This is rare.  Rupture of the uterus. This is very rare.  Your baby could fail to get enough blood flow or oxygen. This can be life-threatening. When induction is needed for medical reasons, the benefits generally outweigh the risks. What happens during the procedure? During the procedure, your health care provider will use one of these methods to induce labor:  Stripping the membranes. In this method, the amniotic sac tissue is gently separated from the cervix. This causes the following to happen: ? Your cervix stretches, which in turn causes the release of prostaglandins. ? Prostaglandins induce labor and cause the uterus to contract. ? This procedure is often done in an office visit. You will be sent home to wait for contractions to begin.  Prostaglandin medicine. This medicine starts contractions and causes the cervix to dilate and ripen. This can be taken by mouth (orally) or by being inserted into the vagina (suppository).  Inserting a small, thin tube (catheter) with a balloon into the vagina and then expanding the balloon with water to dilate the cervix.  Breaking the water. In this method, a small instrument is used to make a small hole in the amniotic sac. This  causes the amniotic sac to break. Contractions should begin within a few hours.  Medicine to trigger or strengthen contractions. This medicine is given through an IV that is inserted into a vein in your arm. This procedure may vary among health care providers and hospitals.   Where to find more information  March of  Dimes: www.marchofdimes.org  The American College of Obstetricians and Gynecologists: www.acog.org Summary  Labor induction causes a pregnant woman's uterus to contract. It also causes the cervix to soften (ripen), open (dilate), and thin out.  Labor is usually not induced before 39 weeks of pregnancy unless there is a medical reason to do so.  When induction is needed for medical reasons, the benefits generally outweigh the risks.  Talk with your health care provider about which methods of labor induction are right for you. This information is not intended to replace advice given to you by your health care provider. Make sure you discuss any questions you have with your health care provider. Document Revised: 01/17/2020 Document Reviewed: 01/17/2020 Elsevier Patient Education  2021 Elsevier Inc.   

## 2020-05-06 NOTE — Progress Notes (Signed)
Routine Prenatal Care Visit  Subjective  Suzanne Nelson is a 25 y.o. G3P0020 at [redacted]w[redacted]d being seen today for ongoing prenatal care.  She is currently monitored for the following issues for this low-risk pregnancy and has Major depressive disorder, single episode, moderate (HCC); Attention deficit hyperactivity disorder, combined type; Oppositional defiant disorder; Hydronephrosis; Calculus of distal left ureter; Left ureteral stone; Supervision of other normal pregnancy, antepartum; Pelvic pain affecting pregnancy in third trimester, antepartum; Headache in pregnancy, antepartum, third trimester; Pyelonephritis affecting pregnancy in third trimester; Back pain affecting pregnancy in third trimester; Abdominal pain during pregnancy in third trimester; Excessive weight gain during pregnancy in third trimester; and [redacted] weeks gestation of pregnancy on their problem list.  ----------------------------------------------------------------------------------- Patient reports R and L sided hip pain. Patient was seen in [facility] triage yesterday with conern for abd pain and back pain. Patient reports limited sleep due to overall feeling "miserable.".   Contractions: Irritability. Vag. Bleeding: None.  Movement: Present. Denies leaking of fluid.  ----------------------------------------------------------------------------------- The following portions of the patient's history were reviewed and updated as appropriate: allergies, current medications, past family history, past medical history, past social history, past surgical history and problem list. Problem list updated.   Objective  Blood pressure 110/66, weight 203 lb (92.1 kg), last menstrual period 07/10/2019. Pregravid weight 130 lb (59 kg) Total Weight Gain 73 lb (33.1 kg) Urinalysis:      Fetal Status: Fetal Heart Rate (bpm): 135 Fundal Height: 37 cm Movement: Present  Presentation: Vertex  General:  Alert, oriented and cooperative. Patient is in no  acute distress.  Skin: Skin is warm and dry. No rash noted.   Cardiovascular: Normal heart rate noted  Respiratory: Normal respiratory effort, no problems with respiration noted  Abdomen: Soft, gravid, appropriate for gestational age. Pain/Pressure: Present     Pelvic:  Cervical exam performed per patient request. Dilation: 1 Effacement (%): 50 Station: -2  Extremities: Normal range of motion.     ental Status: Normal mood and affect. Normal behavior. Normal judgment and thought content.     Assessment   25 y.o. G3P0020 at [redacted]w[redacted]d by  05/23/2020, Date entered prior to episode creation presenting for routine prenatal visit  Plan   THIRD Problems (from 10/17/19 to present)    Problem Noted Resolved   Headache in pregnancy, antepartum, third trimester 03/28/2020 by Zipporah Plants, CNM No   Supervision of other normal pregnancy, antepartum 10/17/2019 by Mirna Mires, CNM No   Overview Addendum 04/16/2020  4:37 PM by Vena Austria, MD     Nursing Staff Provider  Office Location  [facility] Dating   6 wk [place]  Language  English Anatomy US  normal  Flu Vaccine  Declined Genetic Screen  NIPS: normal xx   TDaP vaccine   03/20/2020 Hgb A1C or  GTT Third trimester : 106  Rhogam   not needed   LAB RESULTS   Feeding Plan Pumping, desires to mostly bottle feed/ formula Blood Type --/--/O POS Performed at [facility], 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    Return in about 1 week (around 05/13/2020) for ROB.  Zipporah Plants, CNM, MSN [facility], [facility] 05/06/2020, 3:28 PM

## 2020-05-14 ENCOUNTER — Ambulatory Visit (INDEPENDENT_AMBULATORY_CARE_PROVIDER_SITE_OTHER): Payer: Medicaid Other | Admitting: Obstetrics and Gynecology

## 2020-05-14 ENCOUNTER — Other Ambulatory Visit: Payer: Self-pay

## 2020-05-14 VITALS — BP 122/80 | Wt 213.0 lb

## 2020-05-14 DIAGNOSIS — Z3A38 38 weeks gestation of pregnancy: Secondary | ICD-10-CM

## 2020-05-14 DIAGNOSIS — Z3483 Encounter for supervision of other normal pregnancy, third trimester: Secondary | ICD-10-CM

## 2020-05-14 DIAGNOSIS — O2603 Excessive weight gain in pregnancy, third trimester: Secondary | ICD-10-CM

## 2020-05-14 NOTE — Progress Notes (Signed)
Routine Prenatal Care Visit  Subjective  [name] is a 25 y.o. G3P0020 at [redacted]w[redacted]d being seen today for ongoing prenatal care.  She is currently monitored for the following issues for this low-risk pregnancy and has Major depressive disorder, single episode, moderate (HCC); Attention deficit hyperactivity disorder, combined type; Oppositional defiant disorder; Hydronephrosis; Calculus of distal left ureter; Left ureteral stone; Supervision of other normal pregnancy, antepartum; Pelvic pain affecting pregnancy in third trimester, antepartum; Headache in pregnancy, antepartum, third trimester; Pyelonephritis affecting pregnancy in third trimester; Back pain affecting pregnancy in third trimester; Abdominal pain during pregnancy in third trimester; and Excessive weight gain during pregnancy in third trimester on their problem list.  ----------------------------------------------------------------------------------- Patient reports continued issues with LE swelling, intermittent tingling sensation in toes. Patient reports noted toe intermitternly turn "white.".   Contractions: Irregular. Vag. Bleeding: None.  Movement: Present. Denies leaking of fluid.  ----------------------------------------------------------------------------------- The following portions of the patient's history were reviewed and updated as appropriate: allergies, current medications, past family history, past medical history, past social history, past surgical history and problem list. Problem list updated.   Objective  Blood pressure 122/80, weight 213 lb (96.6 kg), last menstrual period 07/10/2019. Pregravid weight 130 lb (59 kg) Total Weight Gain 83 lb (37.6 kg) Urinalysis:      Fetal Status: Fetal Heart Rate (bpm): 135 Fundal Height: 39 cm Movement: Present  Presentation: Vertex  General:  Alert, oriented and cooperative. Patient is in no acute distress.  Skin: Skin is warm and dry. No rash noted.   Cardiovascular:  Normal heart rate noted  Respiratory: Normal respiratory effort, no problems with respiration noted  Abdomen: Soft, gravid, appropriate for gestational age. Pain/Pressure: Present     Pelvic:  Cervical exam performed Dilation: 1 Effacement (%): 50 Station: -2  Extremities: Normal range of motion.  Edema: Deep pitting, indentation remains for a short time  Feet, including toes are all warm to touch and pink on assessment.  Mental Status: Normal mood and affect. Normal behavior. Normal judgment and thought content.     Assessment   25 y.o. G3P0020 at [redacted]w[redacted]d by  05/23/2020, Date entered prior to episode creation presenting for routine prenatal visit  Plan   THIRD Problems (from 10/17/19 to present)    Problem Noted Resolved   Excessive weight gain during pregnancy in third trimester 04/30/2020 by Zipporah Plants, CNM No   Headache in pregnancy, antepartum, third trimester 03/28/2020 by Zipporah Plants, CNM No   Supervision of other normal pregnancy, antepartum 10/17/2019 by Mirna Mires, CNM No   Overview Addendum 04/16/2020  4:37 PM by Vena Austria, MD     Nursing Staff Provider  Office Location  [facility] Dating   6 wk [place]  Language  English Anatomy US  normal  Flu Vaccine  Declined Genetic Screen  NIPS: normal xx   TDaP vaccine   03/20/2020 Hgb A1C or  GTT Third trimester : 106  Rhogam   not needed   LAB RESULTS   Feeding Plan Pumping, desires to mostly bottle feed/ formula Blood Type --/--/O POS Performed at [facility], [address] [place], [place] [address]  [phone])   Contraception  Antibody  negative  Circumcision  Rubella  Immune  Pediatrician   RPR NON REACTIVE (06/11 0918)   Support Person  HBsAg     Prenatal Classes  HIV Non Reactive (06/11 0918)    Varicella immune  BTL Consent  GBS  (For PCN allergy, check sensitivities)  VBAC Consent  Pap   2021 NIL     Hgb Electro   not needed  Covid Declines CF - carrier     SMA  normal                Previous Version      -Reviewed eIOL process with patient. -Discussed swelling in pregnancy. Recommended frequent position changes for tingling sensation in toes and ensure adequate blood flow to/from extremities. -Patient requesting SVE today.  Term labor precautions including but not limited to vaginal bleeding, contractions, leaking of fluid and fetal movement were reviewed in detail with the patient.    Covid-19 PTA on Friday, 05/16/20. Present for eIOL on Sunday, 05/18/20.  Zipporah Plants, CNM, MSN [facility], [facility] 05/14/2020, 3:55 PM

## 2020-05-14 NOTE — Progress Notes (Incomplete)
  History and Physical  DARRIAN GOODWILL is a 25 y.o. [id number] [redacted]w[redacted]d  for Induction of Labor scheduled due to Elective .   Pregnancy course has been complicated by excessive weight gain in pregnancy (TWG at final ROB [05/14/20] 83 lb). Pyelonephritis affecting pregnancy in the third trimester.   Patient denies pain, bleeding, ruptured membranes, or other signs of progressing labor at this time.  PMHx: She  has a past medical history of ADHD (attention deficit hyperactivity disorder), Anxiety, Asthma, Asthma, BV (bacterial vaginosis) (10/19/2019), Depression, Migraine, No pertinent past medical history, Panic attack, and Pyelonephritis affecting pregnancy in third trimester. Also,  has a past surgical history that includes Ureteroscopy with holmium laser lithotripsy (Left, 05/19/2015); Cysto (N/A, 05/19/2015); and STENT PLACE LEFT URETER ([facility] HX)., family history includes Bipolar disorder in her mother; Breast cancer in her paternal grandmother; Diabetes Mellitus II in her mother.,  reports that she has been smoking cigarettes. She has been smoking about 0.50 packs per day. She has never used smokeless tobacco. She reports previous alcohol use. She reports previous drug use. Frequency: 1.00 time per week. Drug: Marijuana. She has a current medication list which includes the following prescription(s): butalbital-apap-caffeine, cyclobenzaprine, ondansetron, multivitamin-prenatal, [DISCONTINUED] albuterol, and [DISCONTINUED] escitalopram. Also, is allergic to onion, adhesive [tape], and tape. OB History  Gravida Para Term Preterm AB Living  3       2 0  SAB IAB Ectopic Multiple Live Births  2       0    # Outcome Date GA Lbr Len/2nd Weight Sex Delivery Anes PTL Lv  3 Current           2 SAB           1 SAB           Patient denies any other pertinent gynecologic issues.   ROS  Objective: BP 122/80   Wt 213 lb (96.6 kg)   LMP 07/10/2019 (Within Weeks)   BMI 38.96 kg/m  OBGyn Exam  Assessment:  Term Pregnancy for elective Induction of Labor.  Plan: Patient will undergo induction of labor with cervical ripening agents with transition to pitocin when clinically indicated.   Patient has been fully informed of the pros and cons, risks and benefits of continued observation with fetal monitoring versus that of induction of labor.   She understands that there are uncommon risks to induction, which include but are not limited to : frequent or prolonged uterine contractions, fetal distress, uterine rupture, and lack of successful induction.  These risks include all methods including Pitocin and Misoprostol.  Patient understands that using Misoprostol for labor induction is an "off label" indication although it has been studied extensively for this purpose and is an accepted method of induction.  She also has been informed of the increased risks for Cesarean with induction and should induction not be successful.  Patient consents to the induction plan of management.  Plans to breast feed Plans no method for contraception - patient states she is abstinence TDaP UTD - 03/20/20  Zipporah Plants, CNM, MSN [facility], [facility] 05/14/2020  3:56 PM

## 2020-05-16 ENCOUNTER — Other Ambulatory Visit: Payer: Self-pay

## 2020-05-16 ENCOUNTER — Other Ambulatory Visit
Admission: RE | Admit: 2020-05-16 | Discharge: 2020-05-16 | Disposition: A | Discharge status: Home / Self Care | Payer: Medicaid Other | Source: Ambulatory Visit | Attending: Obstetrics and Gynecology | Admitting: Obstetrics and Gynecology

## 2020-05-16 DIAGNOSIS — Z01812 Encounter for preprocedural laboratory examination: Secondary | ICD-10-CM | POA: Insufficient documentation

## 2020-05-16 DIAGNOSIS — Z20822 Contact with and (suspected) exposure to covid-19: Secondary | ICD-10-CM | POA: Diagnosis not present

## 2020-05-16 LAB — SARS CORONAVIRUS 2 (TAT 6-24 HRS): SARS Coronavirus 2: NEGATIVE

## 2020-05-16 NOTE — Progress Notes (Signed)
Patient ID: Suzanne Nelson, female   DOB: July 06, 1995, 25 y.o.   MRN: [id number]   History and Physical  Suzanne Nelson is a 25 y.o. P3X9024 [redacted]w[redacted]d  for Induction of Labor scheduled due to Elective .   Pregnancy course has been complicated by excessive weight gain in pregnancy (TWG at final ROB [05/14/20] 83 lb). Pyelonephritis affecting pregnancy in the third trimester.   Patient reports persistent, significant pelvic discomfort and pain at site of pedal swelling. Patient denies additional sources of pain at this time. Patient denies, bleeding, ruptured membranes, or other signs of progressing labor at this time.  PMHx: She  has a past medical history of ADHD (attention deficit hyperactivity disorder), Anxiety, Asthma, Asthma, BV (bacterial vaginosis) (10/19/2019), Depression, Migraine, No pertinent past medical history, Panic attack, and Pyelonephritis affecting pregnancy in third trimester. Also,  has a past surgical history that includes Ureteroscopy with holmium laser lithotripsy (Left, 05/19/2015); Cysto (N/A, 05/19/2015); and STENT PLACE LEFT URETER ([facility] HX)., family history includes Bipolar disorder in her mother; Breast cancer in her paternal grandmother; Diabetes Mellitus II in her mother.,  reports that she has been smoking cigarettes. She has been smoking about 0.50 packs per day. She has never used smokeless tobacco. She reports previous alcohol use. She reports previous drug use. Frequency: 1.00 time per week. Drug: Marijuana. She has a current medication list which includes the following prescription(s): butalbital-apap-caffeine, cyclobenzaprine, ondansetron, multivitamin-prenatal, [DISCONTINUED] albuterol, and [DISCONTINUED] escitalopram. Also, is allergic to onion, adhesive [tape], and tape. OB History  Gravida Para Term Preterm AB Living  3       2 0  SAB IAB Ectopic Multiple Live Births  2       0    # Outcome Date GA Lbr Len/2nd Weight Sex Delivery Anes PTL Lv  3 Current            2 SAB           1 SAB           Patient denies any other pertinent gynecologic issues.   Review of Systems  Constitutional: Positive for malaise/fatigue. Negative for chills and fever.  HENT: Positive for nosebleeds. Negative for congestion.   Eyes: Negative.   Respiratory: Negative for cough and shortness of breath.   Cardiovascular: Positive for leg swelling. Negative for chest pain and orthopnea.  Gastrointestinal: Positive for heartburn. Negative for abdominal pain, nausea and vomiting.  Genitourinary: Negative.   Musculoskeletal: Positive for joint pain. Negative for back pain, myalgias and neck pain.  Skin: Positive for itching. Negative for rash.  Neurological: Positive for tingling. Negative for sensory change.  Endo/Heme/Allergies: Negative.   Psychiatric/Behavioral: Negative.      Objective: BP 122/80   Wt 213 lb (96.6 kg)   LMP 07/10/2019 (Within Weeks)   BMI 38.96 kg/m  OBGyn Exam  Assessment: Term Pregnancy for elective Induction of Labor.  Plan: Patient will undergo induction of labor with cervical ripening agents with transition to pitocin when clinically indicated.   Patient has been fully informed of the pros and cons, risks and benefits of continued observation with fetal monitoring versus that of induction of labor.   She understands that there are uncommon risks to induction, which include but are not limited to : frequent or prolonged uterine contractions, fetal distress, uterine rupture, and lack of successful induction.  These risks include all methods including Pitocin and Misoprostol.  Patient understands that using Misoprostol for labor induction is an "off label" indication although  it has been studied extensively for this purpose and is an accepted method of induction.  She also has been informed of the increased risks for Cesarean with induction and should induction not be successful.  Patient consents to the induction plan of management.  Plans to  breast feed Plans no method for contraception - patient states she is abstinent TDaP UTD - 03/20/20  Suzanne Nelson, CNM, MSN [facility], [facility] 05/14/2020  3:56 PM

## 2020-05-18 ENCOUNTER — Encounter: Payer: Self-pay | Admitting: Obstetrics and Gynecology

## 2020-05-18 ENCOUNTER — Inpatient Hospital Stay
Admission: RE | Admit: 2020-05-18 | Discharge: 2020-05-20 | DRG: 807 | Disposition: A | Discharge status: Home / Self Care | Payer: Medicaid Other | Attending: Obstetrics and Gynecology | Admitting: Obstetrics and Gynecology

## 2020-05-18 ENCOUNTER — Other Ambulatory Visit: Payer: Self-pay

## 2020-05-18 ENCOUNTER — Inpatient Hospital Stay: Payer: Medicaid Other | Admitting: Anesthesiology

## 2020-05-18 DIAGNOSIS — O26893 Other specified pregnancy related conditions, third trimester: Secondary | ICD-10-CM | POA: Diagnosis present

## 2020-05-18 DIAGNOSIS — Z20822 Contact with and (suspected) exposure to covid-19: Secondary | ICD-10-CM | POA: Diagnosis not present

## 2020-05-18 DIAGNOSIS — Z3480 Encounter for supervision of other normal pregnancy, unspecified trimester: Secondary | ICD-10-CM

## 2020-05-18 DIAGNOSIS — Z3A39 39 weeks gestation of pregnancy: Secondary | ICD-10-CM

## 2020-05-18 DIAGNOSIS — O2603 Excessive weight gain in pregnancy, third trimester: Secondary | ICD-10-CM

## 2020-05-18 DIAGNOSIS — R519 Headache, unspecified: Secondary | ICD-10-CM

## 2020-05-18 DIAGNOSIS — Z3490 Encounter for supervision of normal pregnancy, unspecified, unspecified trimester: Secondary | ICD-10-CM

## 2020-05-18 LAB — CBC
HCT: 31.9 % — ABNORMAL LOW (ref 36.0–46.0)
Hemoglobin: 10.3 g/dL — ABNORMAL LOW (ref 12.0–15.0)
MCH: 27.5 pg (ref 26.0–34.0)
MCHC: 32.3 g/dL (ref 30.0–36.0)
MCV: 85.3 fL (ref 80.0–100.0)
Platelets: 281 10*3/uL (ref 150–400)
RBC: 3.74 MIL/uL — ABNORMAL LOW (ref 3.87–5.11)
RDW: 13.4 % (ref 11.5–15.5)
WBC: 18.3 10*3/uL — ABNORMAL HIGH (ref 4.0–10.5)
nRBC: 0.1 % (ref 0.0–0.2)

## 2020-05-18 LAB — TYPE AND SCREEN
ABO/RH(D): O POS
Antibody Screen: NEGATIVE

## 2020-05-18 MED ORDER — TERBUTALINE SULFATE 1 MG/ML IJ SOLN: 0.2500 mg | Freq: Once | INTRAMUSCULAR | Status: DC | PRN

## 2020-05-18 MED ORDER — ACETAMINOPHEN 325 MG PO TABS
650.0000 mg | ORAL_TABLET | ORAL | Status: DC | PRN
  Administered 2020-05-18: 650 mg via ORAL
  Filled 2020-05-18: qty 2

## 2020-05-18 MED ORDER — FENTANYL 2.5 MCG/ML W/ROPIVACAINE 0.15% IN NS 100 ML EPIDURAL (ARMC)
EPIDURAL | Status: AC
  Filled 2020-05-18: qty 100

## 2020-05-18 MED ORDER — BUPIVACAINE HCL (PF) 0.25 % IJ SOLN
INTRAMUSCULAR | Status: DC | PRN
  Administered 2020-05-18: 6 mL via EPIDURAL

## 2020-05-18 MED ORDER — EPHEDRINE 5 MG/ML INJ: 10.0000 mg | INTRAVENOUS | Status: DC | PRN

## 2020-05-18 MED ORDER — OXYTOCIN-SODIUM CHLORIDE 30-0.9 UT/500ML-% IV SOLN
2.5000 [IU]/h | INTRAVENOUS | Status: DC
  Filled 2020-05-18: qty 1000

## 2020-05-18 MED ORDER — PHENYLEPHRINE 40 MCG/ML (10ML) SYRINGE FOR IV PUSH (FOR BLOOD PRESSURE SUPPORT): 80.0000 ug | PREFILLED_SYRINGE | INTRAVENOUS | Status: DC | PRN

## 2020-05-18 MED ORDER — LIDOCAINE-EPINEPHRINE (PF) 1.5 %-1:200000 IJ SOLN
INTRAMUSCULAR | Status: DC | PRN
  Administered 2020-05-18: 3 mL via PERINEURAL

## 2020-05-18 MED ORDER — MISOPROSTOL 200 MCG PO TABS
ORAL_TABLET | ORAL | Status: AC
  Filled 2020-05-18: qty 4

## 2020-05-18 MED ORDER — MISOPROSTOL 25 MCG QUARTER TABLET
25.0000 ug | ORAL_TABLET | ORAL | Status: DC | PRN
  Administered 2020-05-18: 25 ug via VAGINAL
  Filled 2020-05-18: qty 1

## 2020-05-18 MED ORDER — OXYTOCIN 10 UNIT/ML IJ SOLN
INTRAMUSCULAR | Status: AC
  Filled 2020-05-18: qty 2

## 2020-05-18 MED ORDER — BUTORPHANOL TARTRATE 1 MG/ML IJ SOLN
1.0000 mg | Freq: Once | INTRAMUSCULAR | Status: AC
Start: 2020-05-18 — End: 2020-05-18

## 2020-05-18 MED ORDER — OXYCODONE-ACETAMINOPHEN 5-325 MG PO TABS: 1.0000 | ORAL_TABLET | ORAL | Status: DC | PRN

## 2020-05-18 MED ORDER — LACTATED RINGERS IV SOLN: 500.0000 mL | INTRAVENOUS | Status: DC | PRN

## 2020-05-18 MED ORDER — DIPHENHYDRAMINE HCL 50 MG/ML IJ SOLN: 12.5000 mg | INTRAMUSCULAR | Status: DC | PRN

## 2020-05-18 MED ORDER — AMMONIA AROMATIC IN INHA
RESPIRATORY_TRACT | Status: AC
  Filled 2020-05-18: qty 10

## 2020-05-18 MED ORDER — ACETAMINOPHEN 325 MG PO TABS: 650.0000 mg | ORAL_TABLET | ORAL | Status: DC | PRN

## 2020-05-18 MED ORDER — BENZOCAINE-MENTHOL 20-0.5 % EX AERO
1.0000 "application " | INHALATION_SPRAY | CUTANEOUS | Status: DC | PRN
  Administered 2020-05-18: 1 via TOPICAL
  Filled 2020-05-18: qty 56

## 2020-05-18 MED ORDER — LIDOCAINE HCL (PF) 1 % IJ SOLN
INTRAMUSCULAR | Status: DC | PRN
  Administered 2020-05-18: 3 mL

## 2020-05-18 MED ORDER — ONDANSETRON HCL 4 MG/2ML IJ SOLN: 4.0000 mg | Freq: Four times a day (QID) | INTRAMUSCULAR | Status: DC | PRN

## 2020-05-18 MED ORDER — BUTORPHANOL TARTRATE 1 MG/ML IJ SOLN
INTRAMUSCULAR | Status: AC
  Administered 2020-05-18: 1 mg via INTRAVENOUS
  Filled 2020-05-18: qty 1

## 2020-05-18 MED ORDER — IBUPROFEN 600 MG PO TABS
600.0000 mg | ORAL_TABLET | Freq: Four times a day (QID) | ORAL | Status: DC
  Administered 2020-05-18 – 2020-05-20 (×6): 600 mg via ORAL
  Filled 2020-05-18 (×6): qty 1

## 2020-05-18 MED ORDER — LACTATED RINGERS IV SOLN: 500.0000 mL | Freq: Once | INTRAVENOUS | Status: DC

## 2020-05-18 MED ORDER — LACTATED RINGERS IV SOLN: INTRAVENOUS | Status: DC

## 2020-05-18 MED ORDER — OXYCODONE-ACETAMINOPHEN 5-325 MG PO TABS: 2.0000 | ORAL_TABLET | ORAL | Status: DC | PRN

## 2020-05-18 MED ORDER — OXYTOCIN BOLUS FROM INFUSION
333.0000 mL | Freq: Once | INTRAVENOUS | Status: AC
  Administered 2020-05-18: 333 mL via INTRAVENOUS

## 2020-05-18 MED ORDER — LIDOCAINE HCL (PF) 1 % IJ SOLN
30.0000 mL | INTRAMUSCULAR | Status: DC | PRN
  Filled 2020-05-18: qty 30

## 2020-05-18 MED ORDER — SOD CITRATE-CITRIC ACID 500-334 MG/5ML PO SOLN: 30.0000 mL | ORAL | Status: DC | PRN

## 2020-05-18 MED ORDER — FENTANYL 2.5 MCG/ML W/ROPIVACAINE 0.15% IN NS 100 ML EPIDURAL (ARMC)
12.0000 mL/h | EPIDURAL | Status: DC
  Administered 2020-05-18 (×2): 12 mL/h via EPIDURAL
  Filled 2020-05-18: qty 100

## 2020-05-18 NOTE — Progress Notes (Signed)
  Labor Progress Note   25 y.o. [id number] @ [redacted]w[redacted]d , admitted for  Pregnancy, Labor Management.   Subjective:  Comfortable with epidural  Objective:  BP 136/83   Pulse 97   Temp (!) 97.3 F (36.3 C) (Oral)   Resp 18   Ht 5\' 2"  (1.575 m)   Wt 96.6 kg   LMP 07/10/2019 (Within Weeks)   SpO2 99%   BMI 38.95 kg/m  Abd: gravid, ND, FHT present, mild tenderness on exam Extr: trace edema SVE: CERVIX: 8.5 cm dilated, 90 effaced, +1 station  EFM: FHR: 120 bpm, variability: moderate,  accelerations:  Present,  decelerations:  Absent Toco: Frequency: Every 2-3 minutes Labs: I have reviewed the patient's lab results.   Assessment & Plan:  07/12/2019 @ [redacted]w[redacted]d, admitted for  Pregnancy and Labor/Delivery Management  1. Pain management: epidural. 2. FWB: FHT category I.  3. ID: GBS negative 4. Labor management: expectant management  All discussed with patient, see orders   [redacted]w[redacted]d, CNM [facility] 05/18/2020  6:39 PM

## 2020-05-18 NOTE — Anesthesia Preprocedure Evaluation (Signed)
Anesthesia Evaluation  Patient identified by MRN, date of birth, ID band Patient awake    Reviewed: Allergy & Precautions, H&P , NPO status , Patient's Chart, lab work & pertinent test results, reviewed documented beta blocker date and time   History of Anesthesia Complications Negative for: history of anesthetic complications  Airway Mallampati: II  TM Distance: >3 FB Neck ROM: full    Dental no notable dental hx. (+) Teeth Intact   Pulmonary neg shortness of breath, asthma , neg sleep apnea, neg COPD, neg recent URI, Current Smoker,    Pulmonary exam normal breath sounds clear to auscultation       Cardiovascular Exercise Tolerance: Good negative cardio ROS Normal cardiovascular exam Rhythm:regular Rate:Normal     Neuro/Psych  Headaches, PSYCHIATRIC DISORDERS (Depression and anxiety) Anxiety Depression    GI/Hepatic Neg liver ROS, GERD  Poorly Controlled,  Endo/Other  negative endocrine ROS  Renal/GU Renal disease (kidney stones)  negative genitourinary   Musculoskeletal   Abdominal   Peds  Hematology negative hematology ROS (+)   Anesthesia Other Findings Past Medical History:   Asthma                                                       Anxiety                                                      Depression                                                   Reproductive/Obstetrics (+) Pregnancy                             Anesthesia Physical  Anesthesia Plan  ASA: II  Anesthesia Plan: Epidural   Post-op Pain Management:    Induction:   PONV Risk Score and Plan:   Airway Management Planned: Natural Airway  Additional Equipment:   Intra-op Plan:   Post-operative Plan:   Informed Consent: I have reviewed the patients History and Physical, chart, labs and discussed the procedure including the risks, benefits and alternatives for the proposed anesthesia with the patient  or authorized representative who has indicated his/her understanding and acceptance.     Dental Advisory Given  Plan Discussed with: Anesthesiologist, CRNA and Surgeon  Anesthesia Plan Comments:         Anesthesia Quick Evaluation

## 2020-05-18 NOTE — Discharge Summary (Signed)
OB Discharge Summary     Patient Name: Suzanne Nelson DOB: [birth year] MRN: [id number]  Date of admission: 05/18/2020 Delivering provider: Tresea Mall, CNM  Date of Delivery: 05/18/2020  Date of discharge: 05/20/2020  Admitting diagnosis: Pregnancy [Z34.90] Intrauterine pregnancy: [redacted]w[redacted]d     Secondary diagnosis: None     Discharge diagnosis: Term Pregnancy Delivered                                                                                                Post partum procedures:none  Augmentation: AROM, Pitocin and Cytotec  Complications: None  Hospital course:  Induction of Labor With Vaginal Delivery   25 y.o. yo G3P0020 at [redacted]w[redacted]d was admitted to the hospital 05/18/2020 for induction of labor.  Indication for induction: Elective.  Patient had an uncomplicated labor course as follows: Membrane Rupture Time/Date: 3:00 PM ,05/18/2020   Delivery Method:Vaginal, Spontaneous  Episiotomy: None  Lacerations:  None  Details of delivery can be found in separate delivery note.   Patient had a routine postpartum course. She is tolerating regular diet. Her pain is controlled with PO medication. She is ambulating and voiding without difficulty.  Patient is discharged home 05/20/20.  Newborn Data: Birth date:05/18/2020  Birth time:10:39 PM  Gender:Female  [name] Living status:Living  Apgars:9, 9 Weight: 3520 g, 7 pounds 12 ounces  Physical exam  Vitals:   05/19/20 1111 05/19/20 1621 05/19/20 2317 05/20/20 0755  BP: 124/79 135/75 140/86 124/78  Pulse: (!) 104 69 (!) 104 100  Resp: 20 20 18 18   Temp: 98.2 F (36.8 C) 98 F (36.7 C) 98 F (36.7 C) 98 F (36.7 C)  TempSrc: Oral Oral Oral   SpO2: 100% 99% 97% 100%  Weight:      Height:       General: alert, cooperative and no distress Lochia: appropriate Uterine Fundus: firm Incision: N/A DVT Evaluation: No evidence of DVT seen on physical exam. No significant calf/ankle edema.  Labs: Lab Results  Component Value Date    WBC 23.3 (H) 05/19/2020   HGB 9.8 (L) 05/19/2020   HCT 29.3 (L) 05/19/2020   MCV 84.2 05/19/2020   PLT 245 05/19/2020    Discharge instruction: per After Visit Summary.  Medications:  Allergies as of 05/20/2020      Reactions   Onion Swelling   Adhesive [tape] Other (See Comments)   Steri-Strips & Silk tape   Tape Rash   Reaction to Paper tape. Pt denies reaction to tegaderm or surgical tape.      Medication List    STOP taking these medications   Butalbital-APAP-Caffeine 50-325-40 MG capsule   cyclobenzaprine 10 MG tablet Commonly known as: FLEXERIL   ondansetron 4 MG tablet Commonly known as: Zofran     TAKE these medications   acetaminophen 325 MG tablet Commonly known as: Tylenol Take 2 tablets (650 mg total) by mouth every 4 (four) hours as needed (for pain scale < 4).   ibuprofen 600 MG tablet Commonly known as: ADVIL Take 1 tablet (600 mg total) by mouth every 6 (six) hours.   multivitamin-prenatal 27-0.8 MG Tabs  tablet Take 1 tablet by mouth daily at 12 noon.       Diet: routine diet  Activity: Advance as tolerated. Pelvic rest for 6 weeks.   Outpatient follow up:  Follow-up Information    Vena Austria, MD. Go on 06/03/2020.   Specialty: Obstetrics and Gynecology Why: Follow up appointment for Mood Check on 2/15 @ 8:50 am with Dr. Bonney Aid. Contact information: [address] [place] [address] [phone]                 Postpartum contraception: considering Mirena IUD Rhogam Given postpartum: NA Rubella vaccine given postpartum: no Varicella vaccine given postpartum: no TDaP given antepartum or postpartum: given antepartum    Newborn Delivery   Birth date/time: 05/18/2020 22:39:00 Delivery type: Vaginal, Spontaneous       Baby Feeding: Breast and Formula  Disposition:home with mother  SIGNED:  Zipporah Plants, CNM 05/20/2020 10:23 AM

## 2020-05-18 NOTE — H&P (Signed)
Date of Initial H&P: 05/14/2020  History reviewed, patient examined, no change in status, stable to proceed with cytotec IOL.  COVID negative on preadmission labs.  Fetal heart tones 130, moderate, +accels, no decels.  Vena Austria, MD, [facility], [facility] 05/18/2020, 9:42 AM

## 2020-05-18 NOTE — Progress Notes (Signed)
  Labor Progress Note   25 y.o. [name] @ 100w2d , admitted for  Pregnancy, Labor Management.   Subjective:  Comfortable with epidural  Objective:  BP 136/83   Pulse 97   Temp (!) 97.3 F (36.3 C) (Oral)   Resp 18   Ht 5\' 2"  (1.575 m)   Wt 96.6 kg   LMP 07/10/2019 (Within Weeks)   SpO2 99%   BMI 38.95 kg/m  Abd: gravid, ND, FHT present, mild tenderness on exam Extr: trace edema SVE: CERVIX: 8 cm dilated, 80-90 effaced, -2, -1 station AROM: clear with blood tinge  EFM: FHR: 120 bpm, variability: moderate,  accelerations:  Present,  decelerations:  Absent Toco: Frequency: Every 1.5-3.5 minutes Labs: I have reviewed the patient's lab results.   Assessment & Plan:  07/12/2019 @ [redacted]w[redacted]d, admitted for  Pregnancy and Labor/Delivery Management  1. Pain management: epidural. 2. FWB: FHT category I.  3. ID: GBS negative 4. Labor management: observe contraction pattern, start pitocin as needed, position changes  All discussed with patient, see orders   [redacted]w[redacted]d, CNM [facility] 05/18/2020  3:52 PM

## 2020-05-18 NOTE — Anesthesia Procedure Notes (Signed)
Epidural Patient location during procedure: OB Start time: 05/18/2020 1:05 PM End time: 05/18/2020 1:18 PM  Staffing Anesthesiologist: Yves Dill, MD Performed: anesthesiologist   Preanesthetic Checklist Completed: patient identified, IV checked, site marked, risks and benefits discussed, surgical consent, monitors and equipment checked, pre-op evaluation and timeout performed  Epidural Patient position: sitting Prep: Betadine Patient monitoring: heart rate, continuous pulse ox and blood pressure Approach: midline Location: L3-L4 Injection technique: LOR air  Needle:  Needle type: Tuohy  Needle gauge: 17 G Needle length: 9 cm and 9 Catheter type: closed end flexible Catheter size: 19 Gauge Test dose: negative and 1.5% lidocaine with Epi 1:200 K  Assessment Events: blood not aspirated, injection not painful, no injection resistance, no paresthesia and negative IV test  Additional Notes Time out called.  Patient placed in sitting position.  Back prepped and draped in sterile fashion.  A skin wheal was made in the L3-L4 interspace with 1% Lidocaine plain.  A 17G Tuohy needle was advanced into the epidural space by a loss of resistance technique.  The epidural catheter was threaded 3 cm and the TD was negative.  No blood, fuid or paresthesias.  The patient tolerated the procedure well and the catheter was affixed to the back in sterile fashion.Reason for block:procedure for pain

## 2020-05-19 ENCOUNTER — Encounter: Payer: Self-pay | Admitting: Obstetrics and Gynecology

## 2020-05-19 LAB — CBC
HCT: 29.3 % — ABNORMAL LOW (ref 36.0–46.0)
Hemoglobin: 9.8 g/dL — ABNORMAL LOW (ref 12.0–15.0)
MCH: 28.2 pg (ref 26.0–34.0)
MCHC: 33.4 g/dL (ref 30.0–36.0)
MCV: 84.2 fL (ref 80.0–100.0)
Platelets: 245 10*3/uL (ref 150–400)
RBC: 3.48 MIL/uL — ABNORMAL LOW (ref 3.87–5.11)
RDW: 13.6 % (ref 11.5–15.5)
WBC: 23.3 10*3/uL — ABNORMAL HIGH (ref 4.0–10.5)
nRBC: 0 % (ref 0.0–0.2)

## 2020-05-19 LAB — RPR: RPR Ser Ql: NONREACTIVE

## 2020-05-19 MED ORDER — WITCH HAZEL-GLYCERIN EX PADS: 1.0000 "application " | MEDICATED_PAD | CUTANEOUS | Status: DC | PRN

## 2020-05-19 MED ORDER — ONDANSETRON HCL 4 MG PO TABS: 4.0000 mg | ORAL_TABLET | ORAL | Status: DC | PRN

## 2020-05-19 MED ORDER — SENNOSIDES-DOCUSATE SODIUM 8.6-50 MG PO TABS
2.0000 | ORAL_TABLET | Freq: Every day | ORAL | Status: DC
  Administered 2020-05-19 – 2020-05-20 (×2): 2 via ORAL
  Filled 2020-05-19 (×2): qty 2

## 2020-05-19 MED ORDER — DIBUCAINE (PERIANAL) 1 % EX OINT: 1.0000 "application " | TOPICAL_OINTMENT | CUTANEOUS | Status: DC | PRN

## 2020-05-19 MED ORDER — ONDANSETRON HCL 4 MG/2ML IJ SOLN: 4.0000 mg | INTRAMUSCULAR | Status: DC | PRN

## 2020-05-19 MED ORDER — DIPHENHYDRAMINE HCL 25 MG PO CAPS: 25.0000 mg | ORAL_CAPSULE | Freq: Four times a day (QID) | ORAL | Status: DC | PRN

## 2020-05-19 MED ORDER — PRENATAL MULTIVITAMIN CH
1.0000 | ORAL_TABLET | Freq: Every day | ORAL | Status: DC
  Filled 2020-05-19: qty 1

## 2020-05-19 MED ORDER — SIMETHICONE 80 MG PO CHEW: 80.0000 mg | CHEWABLE_TABLET | ORAL | Status: DC | PRN

## 2020-05-19 MED ORDER — COCONUT OIL OIL: 1.0000 "application " | TOPICAL_OIL | Status: DC | PRN

## 2020-05-19 NOTE — Lactation Note (Signed)
This note was copied from a baby's chart. Lactation Consultation Note  Patient Name: Suzanne Nelson HENID'P Date: 05/19/2020 Reason for consult: Initial assessment;Difficult latch;1st time breastfeeding;Term;Other (Comment) (induced, nipple shield given, suspected tongue tie) Age:26 hours  Initial lactation visit due to call out request for formula. First time mom spoke with pediatrician this morning (according to notes) requesting formula. Baby has a suspected tongue tie, mom given a nipple shield, but is tired and believes baby is not doing well at the breast.  LC reviewed with mom normal newborn behaviors, feeding differences with a shield, potential for increase in frequency, and option for pumping and providing expressed breast milk, and impact that early introduction to formula may have on establishing an adequate breast milk supply. Mom verbalized understanding with all education and requested formula, but also open to pumping and providing expressed milk via bottle.  LC provided formula, educated mom and support person on appropriate volumes, duration milk was good for, and paced bottle feeding.  Mom would like to be set-up with pump, but requests at a later time after she has had some rest.  LC to return in a few hours for pump set-up and education.  Maternal Data Formula Feeding for Exclusion: No Has patient been taught Hand Expression?: Yes Does the patient have breastfeeding experience prior to this delivery?: No  Feeding Feeding Type: Formula Nipple Type: Slow - flow  LATCH Score Latch:  (declined assistance with BF attempt)                 Interventions Interventions: Breast feeding basics reviewed (formula, pump use)  Lactation Tools Discussed/Used WIC Program: Yes   Consult Status Consult Status: Follow-up Date: 05/19/20 Follow-up type: In-patient    Danford Bad 05/19/2020, 8:59 AM

## 2020-05-19 NOTE — Plan of Care (Signed)
  Problem: Education: Goal: Knowledge of General Education information will improve Description Including pain rating scale, medication(s)/side effects and non-pharmacologic comfort measures Outcome: Progressing   Problem: Clinical Measurements: Goal: Ability to maintain clinical measurements within normal limits will improve Outcome: Progressing   Problem: Clinical Measurements: Goal: Will remain free from infection Outcome: Progressing   

## 2020-05-19 NOTE — TOC Initial Note (Addendum)
Transition of Care [facility]) - Initial/Assessment Note    Patient Details  Name: Suzanne Nelson MRN: [id number] Date of Birth: 22-Feb-1996  Transition of Care [facility]) CM/SW Contact:    [place] Cellar, RN Phone Number: 05/19/2020, 1:54 PM  Clinical Narrative:                 Spoke with patient who reports her mother is her main support system and her and the baby will live with her. FOB is not involved. Patient will be pumping and using formula as supplementation. MOB works and grandmother will be watching infant. Engaged with WIC and LC has sent referral to [facility] office for pump. Patient reports she has a cousin who will be staying with her for a while to assist with cleaning and cooking to allow patient time to recover. Patient has selected [facility] for infant care. Discussed PPD/Baby blues in detail and patient reports she is working with her therapist and psychiatrist on monitoring and treatment of her current mental health issues. Has all needed equipment and supplies for infant. Patient does not drive however has strong support system that assists as needed.  No TOC needs or concerns.         Patient Goals and CMS Choice        Expected Discharge Plan and Services                                                Prior Living Arrangements/Services                       Activities of Daily Living Home Assistive Devices/Equipment: Eyeglasses ADL Screening (condition at time of admission) Patient's cognitive ability adequate to safely complete daily activities?: Yes Is the patient deaf or have difficulty hearing?: No Does the patient have difficulty seeing, even when wearing glasses/contacts?: No Does the patient have difficulty concentrating, remembering, or making decisions?: No Patient able to express need for assistance with ADLs?: No Does the patient have difficulty dressing or bathing?: No Independently performs ADLs?: Yes (appropriate for  developmental age) Does the patient have difficulty walking or climbing stairs?: No Weakness of Legs: None Weakness of Arms/Hands: None  Permission Sought/Granted                  Emotional Assessment              Admission diagnosis:  Pregnancy [Z34.90] Patient Active Problem List   Diagnosis Date Noted  . Pregnancy 05/18/2020  . Postpartum care following vaginal delivery 05/18/2020  . Encounter for care or examination of lactating mother 05/18/2020  . Excessive weight gain during pregnancy in third trimester 04/30/2020  . Abdominal pain during pregnancy in third trimester 04/20/2020  . Back pain affecting pregnancy in third trimester   . Pyelonephritis affecting pregnancy in third trimester   . Headache in pregnancy, antepartum, third trimester 03/28/2020  . Pelvic pain affecting pregnancy in third trimester, antepartum   . Supervision of other normal pregnancy, antepartum 10/17/2019  . Hydronephrosis 05/18/2015  . Calculus of distal left ureter 05/18/2015  . Left ureteral stone 05/18/2015  . Major depressive disorder, single episode, moderate (HCC) 03/16/2011  . 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 Oppositional defiant disorder 03/16/2011   PCP:  Patient, No Pcp Per Pharmacy:   [id number] [address], [place] AT [facility] OF [address] [place] [address] Phone: [phone] Fax: [phone]     Social Determinants of Health (SDOH) Interventions    Readmission Risk Interventions No flowsheet data found.

## 2020-05-19 NOTE — Progress Notes (Addendum)
Post Partum Day 1 Subjective: no complaints, up ad lib, voiding, tolerating PO. She is receiving lactation support, but has not been successful with independent  breastfeeding.Verbalizes that she will likely give formula, and perhaps pump some breastmilk.  Stopped her lexapro weeks ago. Does have a psychiatrist who prescribes for her, but she also says that medication 'doesn't really help".  Objective: Blood pressure 127/82, pulse 99, temperature 98.4 F (36.9 C), resp. rate 20, height 5\' 2"  (1.575 m), weight 96.6 kg, last menstrual period 07/10/2019, SpO2 99 %, unknown if currently breastfeeding.  Physical Exam:  General: appears stated age, fatigued, no distress and "oppositional defiance" diagnosis Lochia: appropriate Uterine Fundus: firm Incision: perineum intact DVT Evaluation: No evidence of DVT seen on physical exam. Negative Homan's sign.  Recent Labs    05/18/20 0832 05/19/20 0651  HGB 10.3* 9.8*  HCT 31.9* 29.3*    Assessment/Plan: Plan for discharge tomorrow, Social Work consult secondary to extensive psych hx  And not returning to medication.  Declines contraceptive plan. Iron po daily in addition to a prenatal vitamin.    LOS: 1 day   05/21/20 05/19/2020, 9:50 AM

## 2020-05-19 NOTE — Anesthesia Postprocedure Evaluation (Signed)
Anesthesia Post Note  Patient: Suzanne Nelson  Procedure(s) Performed: AN AD HOC LABOR EPIDURAL  Patient location during evaluation: Mother Baby Anesthesia Type: Epidural Level of consciousness: oriented and awake and alert Pain management: pain level controlled Vital Signs Assessment: post-procedure vital signs reviewed and stable Respiratory status: spontaneous breathing and respiratory function stable Cardiovascular status: blood pressure returned to baseline and stable Postop Assessment: no headache, no backache, no apparent nausea or vomiting and able to ambulate Anesthetic complications: no   No complications documented.   Last Vitals:  Vitals:   05/19/20 0103 05/19/20 0430  BP: 129/85 132/83  Pulse: (!) 101   Resp: 20 18  Temp: 36.9 C 36.8 C  SpO2: 99%     Last Pain:  Vitals:   05/19/20 0430  TempSrc: Oral  PainSc:                  Starling Manns

## 2020-05-19 NOTE — Lactation Note (Signed)
This note was copied from a baby's chart. Lactation Consultation Note  Patient Name: Suzanne Nelson VFIEP'P Date: 05/19/2020 Reason for consult: Follow-up assessment;Difficult latch;1st time breastfeeding;Term Age:26 hours  LC returned to room for pump set-up and education.  DBL EBP set-up, mom educated on pump parts/pieces, use and cleaning, as well as milk storage, thawing and warming. Mom declined to pump at this time as she was skin to skin post bath with baby.  LC has everything ready for when mom is ready, and mom was instructed on pumping every 2-3 hours. Education given for the consistency of colostrum and potential outcome of zero volume the first few pumping sessions, provided reassurance and encouraged hand expression pre and post pumping for optimal milk removal volumes- this can help with onset of an adequate milk production. Mom again declined assistance with putting baby to breast at this time requesting rest.  [facility] referral permission granted and faxed over.  Maternal Data Formula Feeding for Exclusion: No Has patient been taught Hand Expression?: Yes Does the patient have breastfeeding experience prior to this delivery?: No  Feeding Feeding Type: Formula Nipple Type: Slow - flow  LATCH Score Latch:  (declined assistance with BF attempt)                 Interventions Interventions: Breast feeding basics reviewed;DEBP  Lactation Tools Discussed/Used WIC Program: Yes Pump Education: Setup, frequency, and cleaning;Milk Storage Initiated by:: Magnus Ivan, MPH, IBCLC Date initiated:: 05/19/20   Consult Status Consult Status: PRN Date: 05/19/20 Follow-up type: Call as needed    Danford Bad 05/19/2020, 11:27 AM

## 2020-05-20 MED ORDER — ACETAMINOPHEN 325 MG PO TABS: 650.0000 mg | ORAL_TABLET | ORAL | Status: DC | PRN

## 2020-05-20 MED ORDER — IBUPROFEN 600 MG PO TABS: 600.0000 mg | ORAL_TABLET | Freq: Four times a day (QID) | ORAL | 0 refills | Status: DC

## 2020-05-20 NOTE — Lactation Note (Signed)
This note was copied from a baby's chart. Lactation Consultation Note  Mom and support person in the room. LC entered and asked mom how things went throughout the night. Mom stated that things went well. LC reviewed information on pumping schedule, feeding baby expressed milk prior to feeding formula, input and output, and poop color. LC also discussed breast care, fullness vs engorgement, and what to do if she becomes engorged. Mom was informed that she can use ice to reduce any swelling or discomfort that she may experience.  Mom was encouraged to aim for pumping at least 8 times per day in a 24 hour period. LC spoke to mom about calling lactation if she needed any additional help.  LC spoke to mom about making sure that she took care of herself by eating and drinking when needed.   Mom and support person stated that they had no questions.    Plan:  Mom will pump 8 or more times per day in a 24 hour period and feed baby according to cues. Mom will also practice skin-to-skin and call lactation if needed.     Patient Name: Suzanne Nelson Date: 05/20/2020 Reason for consult: Follow-up assessment;1st time breastfeeding;Term;Other (Comment) (pump) Age:69 hours  Maternal Data Has patient been taught Hand Expression?: Yes Does the patient have breastfeeding experience prior to this delivery?: No  Feeding Mother's Current Feeding Choice: Breast Milk and Formula Nipple Type: Slow - flow  LATCH Score                    Lactation Tools Discussed/Used Tools: Pump Breast pump type: Double-Electric Breast Pump Pump Education: Setup, frequency, and cleaning;Milk Storage Reason for Pumping: mom's choice Pumping frequency: q 2-3 hours  Interventions Interventions: Breast feeding basics reviewed;DEBP  Discharge Discharge Education: Engorgement and breast care;Warning signs for feeding baby;Outpatient recommendation;Other (comment) (output, pumping frequency, feeding  recommendation/stomach size) Pump: DEBP WIC Program: Yes    Consult Status Consult Status: Complete Date: 05/20/20 Follow-up type: Call as needed    [facility] 05/20/2020, 9:47 AM

## 2020-05-20 NOTE — Progress Notes (Signed)
Pt discharged with infant.  Discharge instructions, prescriptions and follow up appointment given to and reviewed with pt. Pt verbalized understanding. Escorted out by auxillary. 

## 2020-05-24 ENCOUNTER — Other Ambulatory Visit: Payer: Self-pay | Admitting: Obstetrics and Gynecology

## 2020-05-29 ENCOUNTER — Other Ambulatory Visit: Payer: Self-pay | Admitting: Obstetrics and Gynecology

## 2020-06-03 ENCOUNTER — Telehealth: Payer: Self-pay | Admitting: Advanced Practice Midwife

## 2020-06-03 ENCOUNTER — Encounter: Payer: Self-pay | Admitting: Obstetrics and Gynecology

## 2020-06-03 ENCOUNTER — Ambulatory Visit (INDEPENDENT_AMBULATORY_CARE_PROVIDER_SITE_OTHER): Payer: Medicaid Other | Admitting: Obstetrics and Gynecology

## 2020-06-03 ENCOUNTER — Other Ambulatory Visit: Payer: Self-pay

## 2020-06-03 VITALS — BP 132/78 | Ht 62.0 in | Wt 189.0 lb

## 2020-06-03 DIAGNOSIS — F419 Anxiety disorder, unspecified: Secondary | ICD-10-CM

## 2020-06-03 DIAGNOSIS — F418 Other specified anxiety disorders: Secondary | ICD-10-CM | POA: Diagnosis not present

## 2020-06-03 NOTE — Telephone Encounter (Signed)
Patient coming in on 07/01/2020 at 2:10 with JEG for Mirena insertion

## 2020-06-03 NOTE — Progress Notes (Signed)
Obstetrics & Gynecology Office Visit   Chief Complaint:  Chief Complaint  Patient presents with  . Post-op Follow-up    2 wk postpartum - RM 6    History of Present Illness: The patient is a 25 y.o. female presenting follow up for symptoms of anxiety and depression.  The patient is currently taking nothing but has seen psychiatry with plans to restart pre-pregnancy meds.  She has not had any recent situational stressors beyond recent child birth.    She denies anhedonia, day time somnolence, insomnia, risk taking behavior, irritability, social anxiety, agorophobia, feelings of guilt, feelings of worthlessness, suicidal ideation, homicidal ideation, auditory hallucinations and visual hallucinations. Symptoms have improved since last visit.     The patient does have a pre-existing history of depression and anxiety.    Review of Systems: Review of Systems  Constitutional: Negative.   Gastrointestinal: Negative.   Genitourinary: Negative.   Psychiatric/Behavioral: Negative.    Past Medical History:  Past Medical History:  Diagnosis Date  . ADHD (attention deficit hyperactivity disorder)   . Anxiety   . Asthma   . Asthma   . BV (bacterial vaginosis) 10/19/2019   Noted after NOB. Rx for Metrogel  . Depression   . Migraine   . No pertinent past medical history   . Panic attack   . Pyelonephritis affecting pregnancy in third trimester     Past Surgical History:  Past Surgical History:  Procedure Laterality Date  . CYSTO N/A 05/19/2015   Procedure: CYSTO;  Surgeon: Vanna Scotland, MD;  Location: [facility] ORS;  Service: Urology;  Laterality: N/A;  . STENT PLACE LEFT URETER ([facility] HX)    . URETEROSCOPY WITH HOLMIUM LASER LITHOTRIPSY Left 05/19/2015   Procedure: URETEROSCOPY WITH HOLMIUM LASER LITHOTRIPSY;  Surgeon: Vanna Scotland, MD;  Location: [facility] ORS;  Service: Urology;  Laterality: Left;    Gynecologic History: No LMP recorded.  Obstetric History: [id number]  Family History:   Family History  Problem Relation Age of Onset  . Diabetes Mellitus II Mother   . Bipolar disorder Mother   . Breast cancer Paternal Grandmother        not sure of age    Social History:  Social History   Socioeconomic History  . Marital status: Single    Spouse name: Not on file  . Number of children: Not on file  . Years of education: Not on file  . Highest education level: Not on file  Occupational History    Comment: Geologist, engineering at [employer]  Tobacco Use  . Smoking status: Current Every Day Smoker    Packs/day: 0.50    Types: Cigarettes  . Smokeless tobacco: Never Used  . Tobacco comment: quiting now  Vaping Use  . Vaping Use: Former  . Substances: CBD  Substance and Sexual Activity  . Alcohol use: Not Currently    Comment: socially  . Drug use: Not Currently    Frequency: 1.0 times per week    Types: Marijuana    Comment: Last use one month age, uses vape with CBD currently  . Sexual activity: Not Currently    Partners: Male    Birth control/protection: Abstinence  Other Topics Concern  . Not on file  Social History Narrative   ** Merged History Encounter **       ** Merged History Encounter **       Lives at home, independant   Social Determinants of Health   Financial Resource Strain: Not on file  Food Insecurity: Not on file  Transportation Needs: Not on file  Physical Activity: Not on file  Stress: Not on file  Social Connections: Not on file  Intimate Partner Violence: Not on file    Allergies:  Allergies  Allergen Reactions  . Onion Swelling  . Adhesive [Tape] Other (See Comments)    Steri-Strips & Silk tape  . Tape Rash    Reaction to Paper tape. Pt denies reaction to tegaderm or surgical tape.    Medications: Prior to Admission medications   Medication Sig Start Date End Date Taking? Authorizing Provider  acetaminophen (TYLENOL) 325 MG tablet Take 2 tablets (650 mg total) by mouth every 4 (four) hours as needed (for pain scale < 4).  05/20/20  Yes Veal, Katelyn, CNM  ibuprofen (ADVIL) 600 MG tablet TAKE 1 TABLET(600 MG) BY MOUTH EVERY 6 HOURS 05/25/20  Yes Veal, Katelyn, CNM  Prenatal Vit-Fe Fumarate-FA (MULTIVITAMIN-PRENATAL) 27-0.8 MG TABS tablet Take 1 tablet by mouth daily at 12 noon.   Yes [provider]  ibuprofen (ADVIL) 600 MG tablet TAKE 1 TABLET(600 MG) BY MOUTH EVERY 6 HOURS 05/25/20   Zipporah Plants, CNM  albuterol (PROVENTIL HFA;VENTOLIN HFA) 108 (90 Base) MCG/ACT inhaler Inhale 2 puffs into the lungs every 6 (six) hours as needed for wheezing or shortness of breath. 05/22/18 10/28/19  Enid Derry, PA-C  escitalopram (LEXAPRO) 10 MG tablet Take 10 mg by mouth daily. 12/31/19 04/05/20  [provider]    Physical Exam Vitals:  Vitals:   06/03/20 0900  BP: 132/78   No LMP recorded.  General: NAD HEENT: normocephalic, anicteric Pulmonary: No increased work of breathing Neurologic: Grossly intact Psychiatric: mood appropriate, affect full   Edinburgh Postnatal Depression Scale - 06/03/20 0905      Edinburgh Postnatal Depression Scale:  In the Past 7 Days   I have been able to laugh and see the funny side of things. 0    I have looked forward with enjoyment to things. 0    I have blamed myself unnecessarily when things went wrong. 1    I have been anxious or worried for no good reason. 2    I have felt scared or panicky for no good reason. 0    Things have been getting on top of me. 0    I have been so unhappy that I have had difficulty sleeping. 0    I have felt sad or miserable. 0    I have been so unhappy that I have been crying. 0    The thought of harming myself has occurred to me. 0    Edinburgh Postnatal Depression Scale Total 3           No flowsheet data found.  No flowsheet data found.  No flowsheet data found.   Assessment: 25 y.o. [id number] anxiety/depression check  Plan: Problem List Items Addressed This Visit   None   Visit Diagnoses    Anxiety and depression     -  Primary      1) Has re-established with psychiatry  2) Thyroid and B12 screen has not been obtained previously  3) Return in about 4 weeks (around 07/01/2020) for 6 week postpartum and Mirena IUD Programmer, systems).    Vena Austria, MD, [facility], [facility] 06/03/2020, 9:36 AM

## 2020-06-07 ENCOUNTER — Other Ambulatory Visit: Payer: Self-pay | Admitting: Obstetrics and Gynecology

## 2020-07-01 ENCOUNTER — Encounter: Payer: Self-pay | Admitting: Advanced Practice Midwife

## 2020-07-01 ENCOUNTER — Other Ambulatory Visit: Payer: Self-pay

## 2020-07-01 ENCOUNTER — Ambulatory Visit (INDEPENDENT_AMBULATORY_CARE_PROVIDER_SITE_OTHER): Payer: Medicaid Other | Admitting: Advanced Practice Midwife

## 2020-07-01 DIAGNOSIS — Z30430 Encounter for insertion of intrauterine contraceptive device: Secondary | ICD-10-CM | POA: Diagnosis not present

## 2020-07-01 NOTE — Progress Notes (Signed)
Postpartum Visit  Chief Complaint:  Chief Complaint  Patient presents with  . Postpartum Care    6 wk post partum   . Contraception    Mirena IUD placement     History of Present Illness: Patient is a 25 y.o. [id number] presents for postpartum visit.  Review the Delivery Report for details.  Date of delivery: 05/18/2020 Type of delivery: Vaginal delivery - Vacuum or forceps assisted  no Episiotomy No.  Laceration: 1st degree hemostatic  Pregnancy or labor problems:  no Any problems since the delivery:  no  Newborn Details:  SINGLETON :  1. BabyGender female. Birth weight: 7 pounds 12 ounces Maternal Details:  Breast or formula feeding: switched to formula feeding Intercourse: No  Contraception after delivery: Mirena for primarily cycle control Any bowel or bladder issues: No  Post partum depression/anxiety noted:  no Edinburgh Post-Partum Depression Score: 3 Date of last PAP: 10/17/2019  no abnormalities   Review of Systems: Review of Systems  Constitutional: Negative for chills and fever.  HENT: Negative for congestion, ear discharge, ear pain, hearing loss, sinus pain and sore throat.   Eyes: Negative for blurred vision and double vision.  Respiratory: Negative for cough, shortness of breath and wheezing.   Cardiovascular: Negative for chest pain, palpitations and leg swelling.  Gastrointestinal: Negative for abdominal pain, blood in stool, constipation, diarrhea, heartburn, melena, nausea and vomiting.  Genitourinary: Negative for dysuria, flank pain, frequency, hematuria and urgency.  Musculoskeletal: Negative for back pain, joint pain and myalgias.  Skin: Negative for itching and rash.  Neurological: Negative for dizziness, tingling, tremors, sensory change, speech change, focal weakness, seizures, loss of consciousness, weakness and headaches.  Endo/Heme/Allergies: Negative for environmental allergies. Does not bruise/bleed easily.  Psychiatric/Behavioral:  Negative for depression, hallucinations, memory loss, substance abuse and suicidal ideas. The patient is not nervous/anxious and does not have insomnia.      Past Medical History:  Past Medical History:  Diagnosis Date  . ADHD (attention deficit hyperactivity disorder)   . Anxiety   . Asthma   . Asthma   . BV (bacterial vaginosis) 10/19/2019   Noted after NOB. Rx for Metrogel  . Depression   . Migraine   . No pertinent past medical history   . Panic attack   . Pyelonephritis affecting pregnancy in third trimester     Past Surgical History:  Past Surgical History:  Procedure Laterality Date  . CYSTO N/A 05/19/2015   Procedure: CYSTO;  Surgeon: Vanna Scotland, MD;  Location: [facility] ORS;  Service: Urology;  Laterality: N/A;  . STENT PLACE LEFT URETER ([facility] HX)    . URETEROSCOPY WITH HOLMIUM LASER LITHOTRIPSY Left 05/19/2015   Procedure: URETEROSCOPY WITH HOLMIUM LASER LITHOTRIPSY;  Surgeon: Vanna Scotland, MD;  Location: [facility] ORS;  Service: Urology;  Laterality: Left;    Family History:  Family History  Problem Relation Age of Onset  . Diabetes Mellitus II Mother   . Bipolar disorder Mother   . Breast cancer Paternal Grandmother        not sure of age    Social History:  Social History   Socioeconomic History  . Marital status: Single    Spouse name: Not on file  . Number of children: Not on file  . Years of education: Not on file  . Highest education level: Not on file  Occupational History    Comment: Geologist, engineering at [employer]  Tobacco Use  . Smoking status: Current Every Day Smoker  Packs/day: 0.50    Types: Cigarettes  . Smokeless tobacco: Never Used  . Tobacco comment: quiting now  Vaping Use  . Vaping Use: Former  . Substances: CBD  Substance and Sexual Activity  . Alcohol use: Not Currently    Comment: socially  . Drug use: Not Currently    Frequency: 1.0 times per week    Types: Marijuana    Comment: Last use one month age, uses vape with CBD currently   . Sexual activity: Not Currently    Partners: Male    Birth control/protection: Abstinence  Other Topics Concern  . Not on file  Social History Narrative   ** Merged History Encounter **       ** Merged History Encounter **       Lives at home, independant   Social Determinants of Health   Financial Resource Strain: Not on file  Food Insecurity: Not on file  Transportation Needs: Not on file  Physical Activity: Not on file  Stress: Not on file  Social Connections: Not on file  Intimate Partner Violence: Not on file    Allergies:  Allergies  Allergen Reactions  . Onion Swelling  . Adhesive [Tape] Other (See Comments)    Steri-Strips & Silk tape  . Tape Rash    Reaction to Paper tape. Pt denies reaction to tegaderm or surgical tape.    Medications: Prior to Admission medications   Medication Sig Start Date End Date Taking? Authorizing Provider  acetaminophen (TYLENOL) 325 MG tablet Take 2 tablets (650 mg total) by mouth every 4 (four) hours as needed (for pain scale < 4). 05/20/20  Yes Veal, Katelyn, CNM  Prenatal Vit-Fe Fumarate-FA (MULTIVITAMIN-PRENATAL) 27-0.8 MG TABS tablet Take 1 tablet by mouth daily at 12 noon.   Yes [provider]  albuterol (PROVENTIL HFA;VENTOLIN HFA) 108 (90 Base) MCG/ACT inhaler Inhale 2 puffs into the lungs every 6 (six) hours as needed for wheezing or shortness of breath. 05/22/18 10/28/19  Enid Derry, PA-C  escitalopram (LEXAPRO) 10 MG tablet Take 10 mg by mouth daily. 12/31/19 04/05/20  [provider]    Physical Exam Blood pressure 118/68, pulse 92, height 5\' 2"  (1.575 m), weight 194 lb (88 kg), last menstrual period 06/24/2020, not currently breastfeeding.    General: NAD HEENT: normocephalic, anicteric Pulmonary: No increased work of breathing Abdomen: NABS, soft, non-tender, non-distended.  Umbilicus without lesions.  No hepatomegaly, splenomegaly or masses palpable. No evidence of  hernia. Genitourinary:  External: Normal external female genitalia.  Normal urethral meatus, normal Bartholin's and Skene's glands.    Vagina: Normal vaginal mucosa, no evidence of prolapse.    Cervix: Grossly normal in appearance, no bleeding, no CMT  Uterus: Non-enlarged, mobile, normal contour.    Adnexa: ovaries non-enlarged, no adnexal masses  Rectal: deferred Extremities: no edema, erythema, or tenderness Neurologic: Grossly intact Psychiatric: mood appropriate, affect full   Edinburgh Postnatal Depression Scale - 07/01/20 1420      Edinburgh Postnatal Depression Scale:  In the Past 7 Days   I have been able to laugh and see the funny side of things. 0    I have looked forward with enjoyment to things. 0    I have blamed myself unnecessarily when things went wrong. 1    I have been anxious or worried for no good reason. 2    I have felt scared or panicky for no good reason. 0    Things have been getting on top of me. 0  I have been so unhappy that I have had difficulty sleeping. 0    I have felt sad or miserable. 0    I have been so unhappy that I have been crying. 0    The thought of harming myself has occurred to me. 0    Edinburgh Postnatal Depression Scale Total 3           Assessment: 25 y.o. [id number] presenting for 6 week postpartum visit  Plan: Problem List Items Addressed This Visit   None   Visit Diagnoses    6 weeks postpartum follow-up    -  Primary   Encounter for insertion of mirena IUD           1) Contraception - Education given regarding options for contraception, as well as compatibility with breast feeding if applicable.  Patient plans on IUD for contraception. Mirena insertion today  2)  Pap - ASCCP guidelines and rationale discussed.  ASCCP guidelines and rational discussed.  Patient opts for every 3 years screening interval  3) Patient underwent screening for postpartum depression with no signs of depression  4) Return in about 4 weeks  (around 07/29/2020) for iud string check.   Tresea Mall, CNM [facility] 07/01/2020, 4:29 PM     GYNECOLOGY OFFICE PROCEDURE NOTE  Suzanne Nelson is a 25 y.o. [phone] here for Mirena IUD insertion. No GYN concerns.  Last pap smear was on 10/17/2019 and was normal.  The patient is currently using abstinence for contraception and her LMP is Patient's last menstrual period was 06/24/2020..  The indication for her IUD is contraception/cycle control.  IUD Insertion Procedure Note Patient identified, informed consent performed, consent signed.   Discussed risks of irregular bleeding, cramping, infection, malpositioning, expulsion or uterine perforation of the IUD (1:1000 placements)  which may require further procedure such as laparoscopy.  IUD while effective at preventing pregnancy do not prevent transmission of sexually transmitted diseases and use of barrier methods for this purpose was discussed. Time out was performed.  Urine pregnancy test negative.  Speculum placed in the vagina.  Cervix visualized.  Cleaned with Betadine x 3.  Grasped posteriorly with a single tooth tenaculum.  Uterus sounded to 5.75 cm. IUD placed per manufacturer's recommendations.  Strings trimmed to 3 cm. Tenaculum was removed, good hemostasis noted.  Patient tolerated procedure well.   Patient was given post-procedure instructions.  She was advised to have backup contraception for one week.  Patient was also asked to check IUD strings periodically and follow up in 4-6 weeks for IUD check.  IUD insertion CPT 58300,  Skyla [id number] Mirena [id number] Liletta J7297 Paraguard J7300 Rutha Bouchard [phone] Modifer 25, plus Modifer 79 is done during a global billing visit    Tresea Mall, CNM [facility] 07/01/2020, 4:30 PM

## 2020-07-18 ENCOUNTER — Telehealth: Payer: Self-pay

## 2020-07-18 NOTE — Telephone Encounter (Signed)
Pt calling; needs note to return back to work so she can be put on the schedule; p/u today; was seen 07/01/20.  [phone]  Pt states needs to return to work on 4/3rd.  Adv to ask receptionist for it.

## 2020-07-30 ENCOUNTER — Ambulatory Visit (INDEPENDENT_AMBULATORY_CARE_PROVIDER_SITE_OTHER): Payer: Medicaid Other | Admitting: Advanced Practice Midwife

## 2020-07-30 ENCOUNTER — Encounter: Payer: Self-pay | Admitting: Advanced Practice Midwife

## 2020-07-30 ENCOUNTER — Other Ambulatory Visit: Payer: Self-pay

## 2020-07-30 VITALS — BP 114/70 | Ht 62.0 in | Wt 200.8 lb

## 2020-07-30 DIAGNOSIS — Z30431 Encounter for routine checking of intrauterine contraceptive device: Secondary | ICD-10-CM | POA: Diagnosis not present

## 2020-08-06 NOTE — Progress Notes (Signed)
       IUD String Check  Subjctive: [name] presents for IUD string check.  She had a Mirena placed 4 weeks ago.  Since placement of her IUD she had light vaginal bleeding.  She denies cramping or discomfort.  She has not had intercourse since placement.  She has not checked the strings.  She denies any fever, chills, nausea, vomiting, or other complaints.    Review of Systems  Constitutional: Negative for chills and fever.  HENT: Negative for congestion, ear discharge, ear pain, hearing loss, sinus pain and sore throat.   Eyes: Negative for blurred vision and double vision.  Respiratory: Negative for cough, shortness of breath and wheezing.   Cardiovascular: Negative for chest pain, palpitations and leg swelling.  Gastrointestinal: Negative for abdominal pain, blood in stool, constipation, diarrhea, heartburn, melena, nausea and vomiting.  Genitourinary: Negative for dysuria, flank pain, frequency, hematuria and urgency.  Musculoskeletal: Negative for back pain, joint pain and myalgias.  Skin: Negative for itching and rash.  Neurological: Negative for dizziness, tingling, tremors, sensory change, speech change, focal weakness, seizures, loss of consciousness, weakness and headaches.  Endo/Heme/Allergies: Negative for environmental allergies. Does not bruise/bleed easily.  Psychiatric/Behavioral: Negative for depression, hallucinations, memory loss, substance abuse and suicidal ideas. The patient is not nervous/anxious and does not have insomnia.      Objective: BP 114/70   Ht 5\' 2"  (1.575 m)   Wt 200 lb 12.8 oz (91.1 kg)   BMI 36.73 kg/m  Gen:  NAD, A&Ox3 HEENT: normocephalic, anicteric Pulmonary: no increased work of breathing Pelvic: Normal appearing external female genitalia, normal vaginal epithelium, no abnormal discharge. Normal appearing cervix.  IUD strings visible and 2 cm in length- slightly shorter than at the time of placement. Psychiatric: mood  appropriate, affect full Neurologic: grossly normal   Assessment: 25 y.o. year old female status post prior Mirena IUD placement 4 weeks ago, doing well.  Plan: 1.  The patient was given instructions to check her IUD strings monthly and call with any problems or concerns.  She should call for fevers, chills, abnormal vaginal discharge, pelvic pain, or other complaints. 2.  She will return for a annual exam in 1 year.  All questions answered.   30, CNM 08/06/2020 10:56 AM

## 2020-12-13 ENCOUNTER — Encounter: Payer: Self-pay | Admitting: Emergency Medicine

## 2020-12-13 ENCOUNTER — Emergency Department
Admission: EM | Admit: 2020-12-13 | Discharge: 2020-12-13 | Disposition: A | Discharge status: Home / Self Care | Payer: Medicaid Other | Attending: Emergency Medicine | Admitting: Emergency Medicine

## 2020-12-13 ENCOUNTER — Other Ambulatory Visit: Payer: Self-pay

## 2020-12-13 DIAGNOSIS — R1030 Lower abdominal pain, unspecified: Secondary | ICD-10-CM | POA: Diagnosis not present

## 2020-12-13 DIAGNOSIS — Z5321 Procedure and treatment not carried out due to patient leaving prior to being seen by health care provider: Secondary | ICD-10-CM | POA: Diagnosis not present

## 2020-12-13 DIAGNOSIS — R1110 Vomiting, unspecified: Secondary | ICD-10-CM | POA: Diagnosis not present

## 2020-12-13 DIAGNOSIS — J029 Acute pharyngitis, unspecified: Secondary | ICD-10-CM | POA: Diagnosis not present

## 2020-12-13 LAB — COMPREHENSIVE METABOLIC PANEL
ALT: 28 U/L (ref 0–44)
AST: 22 U/L (ref 15–41)
Albumin: 3.8 g/dL (ref 3.5–5.0)
Alkaline Phosphatase: 109 U/L (ref 38–126)
Anion gap: 8 (ref 5–15)
BUN: 7 mg/dL (ref 6–20)
CO2: 24 mmol/L (ref 22–32)
Calcium: 8.9 mg/dL (ref 8.9–10.3)
Chloride: 108 mmol/L (ref 98–111)
Creatinine, Ser: 0.51 mg/dL (ref 0.44–1.00)
GFR, Estimated: >60 mL/min (ref >60)
Glucose, Bld: 82 mg/dL (ref 70–99)
Potassium: 3.5 mmol/L (ref 3.5–5.1)
Sodium: 140 mmol/L (ref 135–145)
Total Bilirubin: 0.7 mg/dL (ref 0.3–1.2)
Total Protein: 6.5 g/dL (ref 6.5–8.1)

## 2020-12-13 LAB — LIPASE, BLOOD: Lipase: 30 U/L (ref 11–51)

## 2020-12-13 LAB — CBC
HCT: 42.9 % (ref 36.0–46.0)
Hemoglobin: 14.4 g/dL (ref 12.0–15.0)
MCH: 29 pg (ref 26.0–34.0)
MCHC: 33.6 g/dL (ref 30.0–36.0)
MCV: 86.5 fL (ref 80.0–100.0)
Platelets: 354 10*3/uL (ref 150–400)
RBC: 4.96 MIL/uL (ref 3.87–5.11)
RDW: 13.4 % (ref 11.5–15.5)
WBC: 7.3 10*3/uL (ref 4.0–10.5)
nRBC: 0 % (ref 0.0–0.2)

## 2020-12-13 NOTE — ED Notes (Signed)
Pt came back inside and states that she was sitting outside

## 2020-12-13 NOTE — ED Triage Notes (Signed)
Pt via POV from home. Pt c/o sore throat, lower abd pain, emesis, cough, and low grade fever since yesterday. Pt states she had one episode of vomiting where she threw up blood. Pt states that blood was bright red. Pt is A&OX4 and NAD. Denies abd surgeries.

## 2020-12-19 ENCOUNTER — Ambulatory Visit
Admission: RE | Admit: 2020-12-19 | Discharge: 2020-12-19 | Disposition: A | Discharge status: Home / Self Care | Payer: Medicaid Other | Attending: Family Medicine | Admitting: Family Medicine

## 2020-12-19 ENCOUNTER — Ambulatory Visit
Admission: RE | Admit: 2020-12-19 | Discharge: 2020-12-19 | Disposition: A | Discharge status: Home / Self Care | Payer: Medicaid Other | Source: Ambulatory Visit | Attending: Family Medicine | Admitting: Family Medicine

## 2020-12-19 ENCOUNTER — Other Ambulatory Visit: Payer: Self-pay | Admitting: Family Medicine

## 2020-12-19 DIAGNOSIS — W19XXXA Unspecified fall, initial encounter: Secondary | ICD-10-CM

## 2020-12-19 DIAGNOSIS — M25571 Pain in right ankle and joints of right foot: Secondary | ICD-10-CM | POA: Insufficient documentation

## 2021-02-06 NOTE — Telephone Encounter (Signed)
Mirena rcvd/charged 07/01/20

## 2021-03-02 IMAGING — US US OB < 14 WEEKS - US OB TV
1 series · 14 of 28 positions shown · non-contrast
Comparison: None.

CLINICAL DATA: Vaginal bleeding and cramping beginning at [DATE]
period was 07/10/2019

EXAM:
OBSTETRIC <14 WK US AND TRANSVAGINAL OB US
TECHNIQUE: Both transabdominal and transvaginal ultrasound examinations were
performed for complete evaluation of the gestation as well as the
maternal uterus, adnexal regions, and pelvic cul-de-sac.
Transvaginal technique was performed to assess early pregnancy.

[Series 1: us ob less than 14 weeks with ob transvaginal · 14 of 85 slices shown]
[im 4/85]
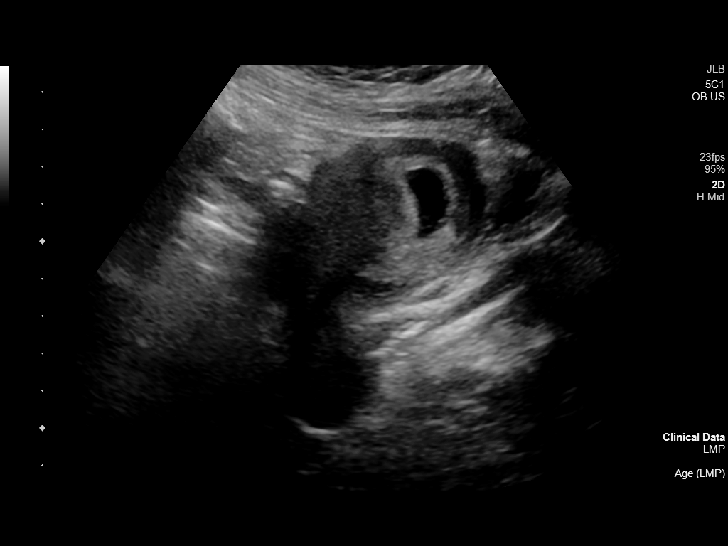
[im 10/85]
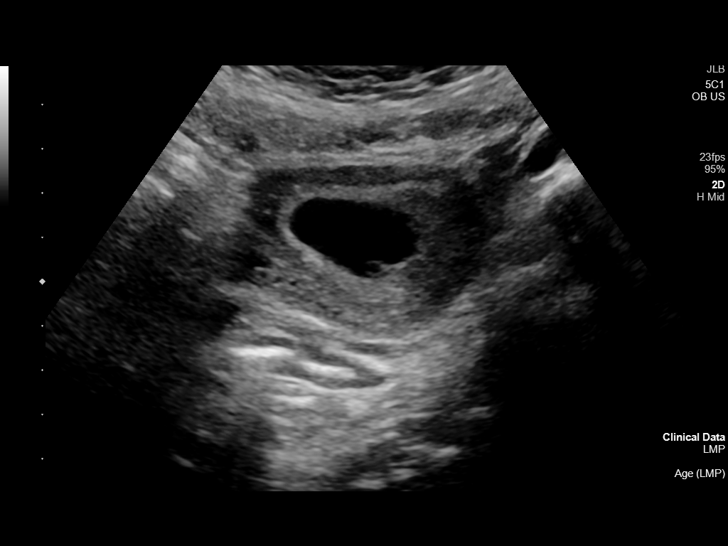
[im 16/85]
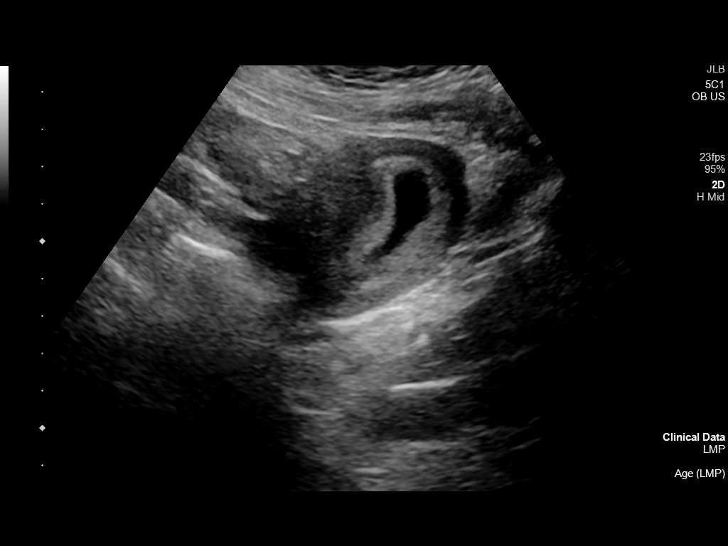
[im 22/85]
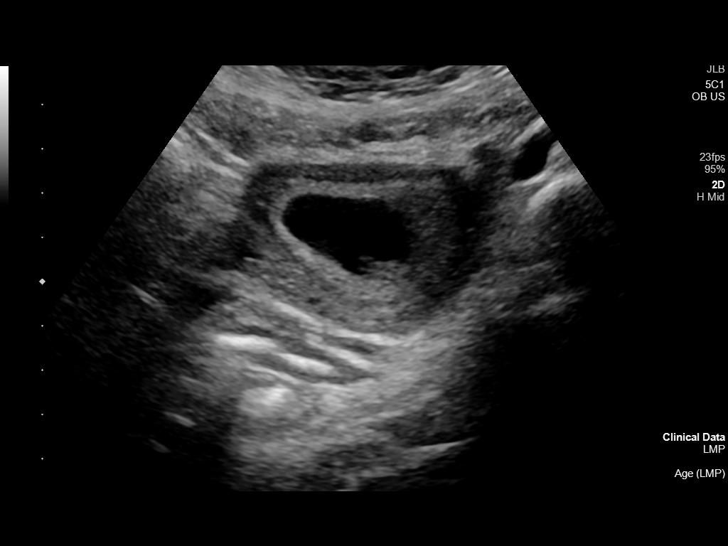
[im 29/85]
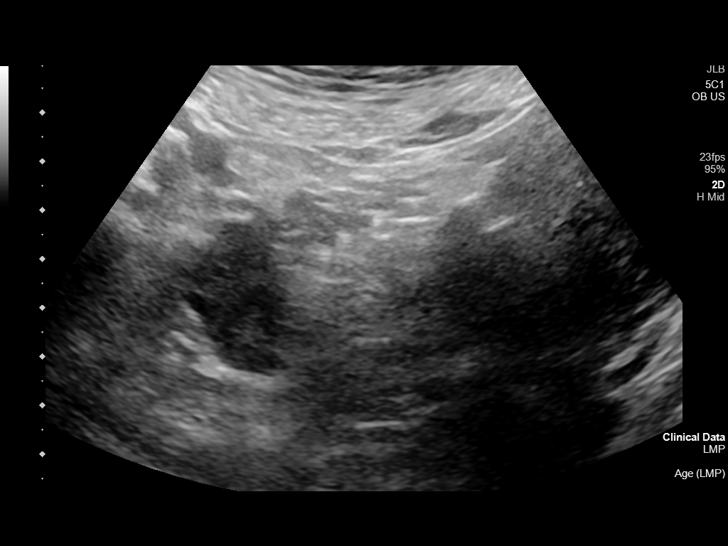
[im 35/85]
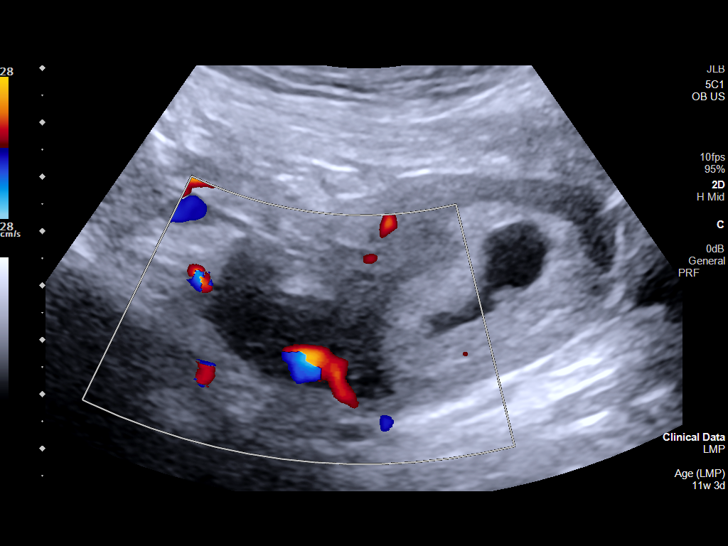
[im 41/85]
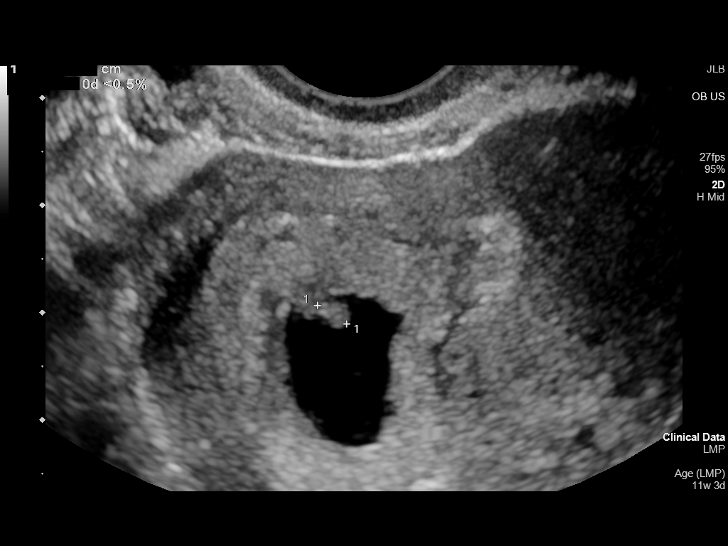
[im 47/85]
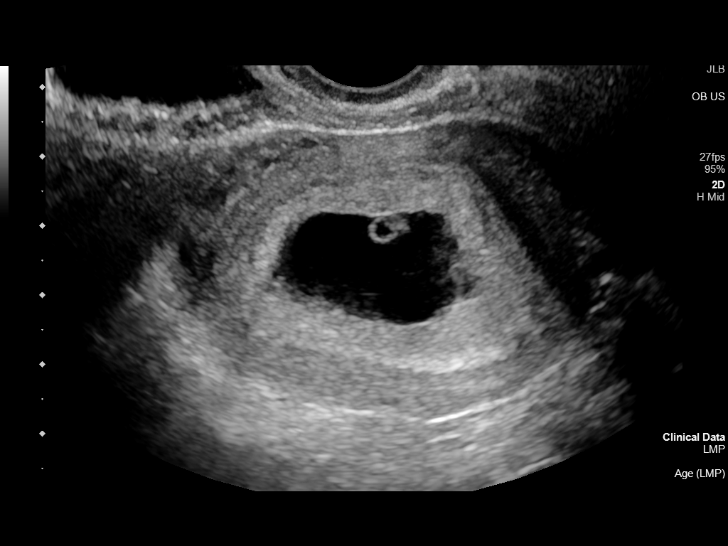
[im 53/85]
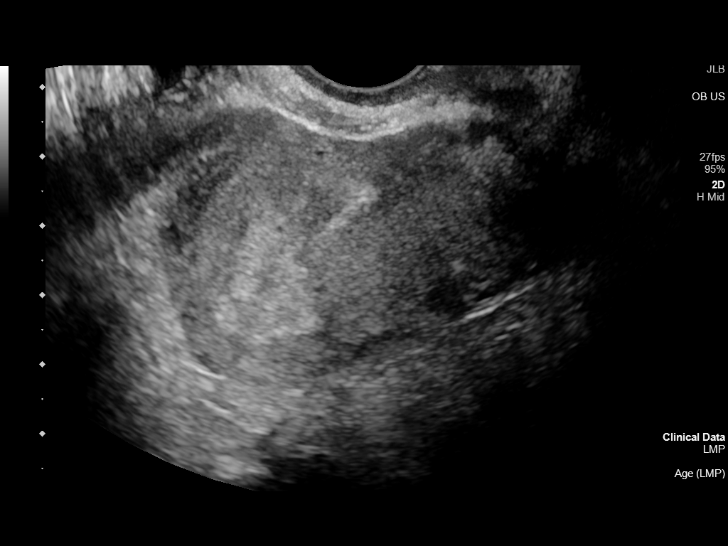
[im 60/85]
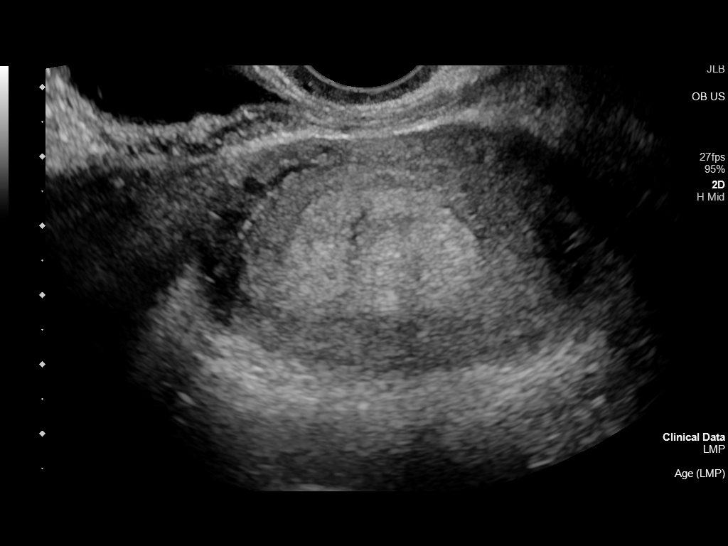
[im 66/85]
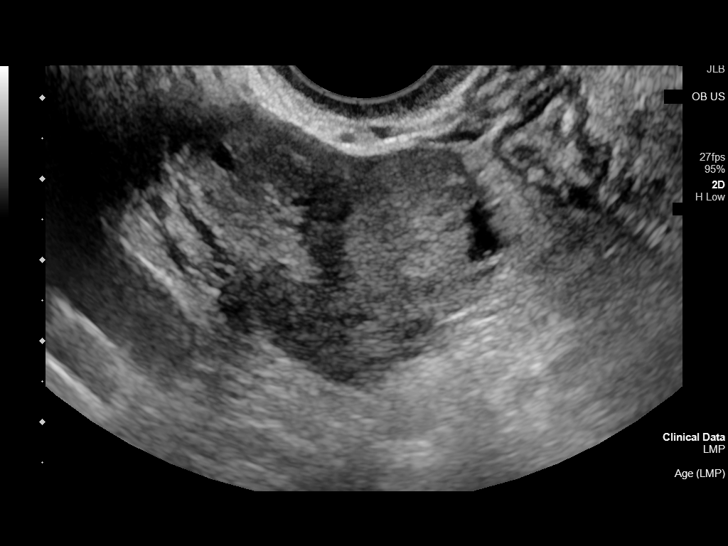
[im 72/85]
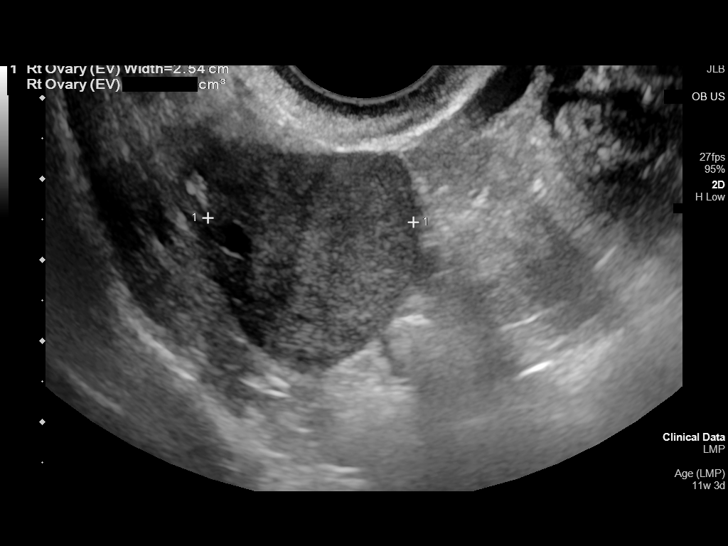
[im 78/85]
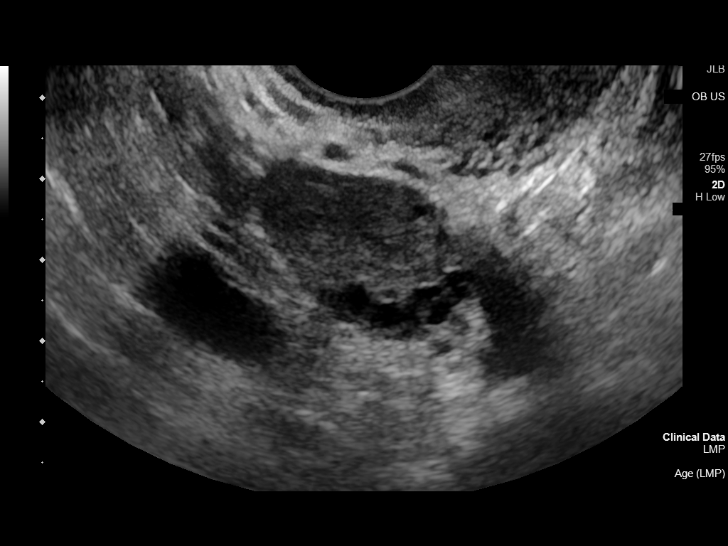
[im 85/85]
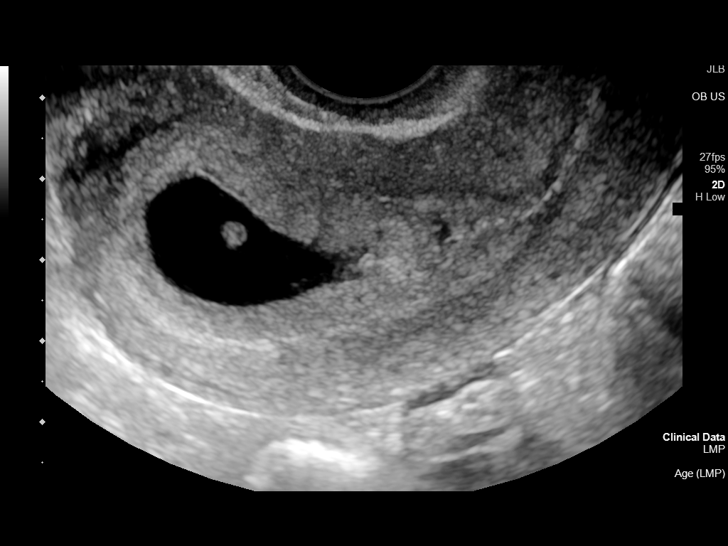

[14 of 28 positions shown; findings below may reference images not displayed]

FINDINGS: Intrauterine gestational sac: Single.

Yolk sac:  Present

Embryo:  Present

Cardiac Activity: Present

Heart Rate: 119 bpm

CRL: 3.6 mm   6 w   0 d                  US EDC: 05/23/2020

Subchorionic hemorrhage:  None visualized.

Maternal uterus/adnexae: Uterus and adnexa are otherwise within
normal limits
IMPRESSION: 1. Unremarkable single intrauterine pregnancy. Dates are earlier
than predicted by last menstrual.

## 2021-06-28 ENCOUNTER — Other Ambulatory Visit: Payer: Self-pay

## 2021-06-28 ENCOUNTER — Encounter: Payer: Self-pay | Admitting: Emergency Medicine

## 2021-06-28 ENCOUNTER — Emergency Department
Admission: EM | Admit: 2021-06-28 | Discharge: 2021-06-28 | Disposition: A | Discharge status: Home / Self Care | Payer: No Typology Code available for payment source | Attending: Emergency Medicine | Admitting: Emergency Medicine

## 2021-06-28 ENCOUNTER — Emergency Department: Payer: No Typology Code available for payment source

## 2021-06-28 DIAGNOSIS — M25512 Pain in left shoulder: Secondary | ICD-10-CM | POA: Insufficient documentation

## 2021-06-28 DIAGNOSIS — M545 Low back pain, unspecified: Secondary | ICD-10-CM | POA: Diagnosis not present

## 2021-06-28 DIAGNOSIS — Y92410 Unspecified street and highway as the place of occurrence of the external cause: Secondary | ICD-10-CM | POA: Insufficient documentation

## 2021-06-28 DIAGNOSIS — M62830 Muscle spasm of back: Secondary | ICD-10-CM

## 2021-06-28 MED ORDER — KETOROLAC TROMETHAMINE 10 MG PO TABS
10.0000 mg | ORAL_TABLET | Freq: Once | ORAL | Status: AC
  Administered 2021-06-28: 10 mg via ORAL
  Filled 2021-06-28: qty 1

## 2021-06-28 MED ORDER — KETOROLAC TROMETHAMINE 10 MG PO TABS: 10.0000 mg | ORAL_TABLET | Freq: Three times a day (TID) | ORAL | 0 refills | Status: DC | PRN
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
## 2021-07-02 ENCOUNTER — Other Ambulatory Visit: Payer: Self-pay

## 2021-07-02 ENCOUNTER — Emergency Department
Admission: EM | Admit: 2021-07-02 | Discharge: 2021-07-02 | Disposition: A | Discharge status: Home / Self Care | Payer: Medicaid Other | Attending: Emergency Medicine | Admitting: Emergency Medicine

## 2021-07-02 DIAGNOSIS — Z20822 Contact with and (suspected) exposure to covid-19: Secondary | ICD-10-CM | POA: Insufficient documentation

## 2021-07-02 DIAGNOSIS — R197 Diarrhea, unspecified: Secondary | ICD-10-CM | POA: Diagnosis present

## 2021-07-02 DIAGNOSIS — K529 Noninfective gastroenteritis and colitis, unspecified: Secondary | ICD-10-CM | POA: Diagnosis not present

## 2021-07-02 LAB — CBC
HCT: 46.9 % — ABNORMAL HIGH (ref 36.0–46.0)
Hemoglobin: 15.4 g/dL — ABNORMAL HIGH (ref 12.0–15.0)
MCH: 29.1 pg (ref 26.0–34.0)
MCHC: 32.8 g/dL (ref 30.0–36.0)
MCV: 88.5 fL (ref 80.0–100.0)
Platelets: 347 10*3/uL (ref 150–400)
RBC: 5.3 MIL/uL — ABNORMAL HIGH (ref 3.87–5.11)
RDW: 12.9 % (ref 11.5–15.5)
WBC: 11.2 10*3/uL — ABNORMAL HIGH (ref 4.0–10.5)
nRBC: 0 % (ref 0.0–0.2)

## 2021-07-02 LAB — URINALYSIS, ROUTINE W REFLEX MICROSCOPIC
Bilirubin Urine: NEGATIVE
Glucose, UA: NEGATIVE mg/dL
Hgb urine dipstick: NEGATIVE
Ketones, ur: NEGATIVE mg/dL
Nitrite: NEGATIVE
Protein, ur: NEGATIVE mg/dL
Specific Gravity, Urine: 1.02 (ref 1.005–1.030)
pH: 8 (ref 5.0–8.0)

## 2021-07-02 LAB — COMPREHENSIVE METABOLIC PANEL
ALT: 23 U/L (ref 0–44)
AST: 23 U/L (ref 15–41)
Albumin: 4.2 g/dL (ref 3.5–5.0)
Alkaline Phosphatase: 118 U/L (ref 38–126)
Anion gap: 8 (ref 5–15)
BUN: 11 mg/dL (ref 6–20)
CO2: 28 mmol/L (ref 22–32)
Calcium: 9.4 mg/dL (ref 8.9–10.3)
Chloride: 106 mmol/L (ref 98–111)
Creatinine, Ser: 0.76 mg/dL (ref 0.44–1.00)
GFR, Estimated: >60 mL/min (ref >60)
Glucose, Bld: 80 mg/dL (ref 70–99)
Potassium: 4.1 mmol/L (ref 3.5–5.1)
Sodium: 142 mmol/L (ref 135–145)
Total Bilirubin: 0.5 mg/dL (ref 0.3–1.2)
Total Protein: 7.5 g/dL (ref 6.5–8.1)

## 2021-07-02 LAB — RESP PANEL BY RT-PCR (FLU A&B, COVID) ARPGX2
Influenza A by PCR: NEGATIVE
Influenza B by PCR: NEGATIVE
SARS Coronavirus 2 by RT PCR: NEGATIVE

## 2021-07-02 LAB — POC URINE PREG, ED: Preg Test, Ur: NEGATIVE

## 2021-07-02 LAB — LIPASE, BLOOD: Lipase: 31 U/L (ref 11–51)

## 2021-07-02 MED ORDER — SODIUM CHLORIDE 0.9 % IV BOLUS
1000.0000 mL | Freq: Once | INTRAVENOUS | Status: AC
  Administered 2021-07-02: 1000 mL via INTRAVENOUS

## 2021-07-02 MED ORDER — ONDANSETRON HCL 4 MG/2ML IJ SOLN
4.0000 mg | Freq: Once | INTRAMUSCULAR | Status: AC
  Administered 2021-07-02: 4 mg via INTRAVENOUS
  Filled 2021-07-02: qty 2

## 2021-07-02 MED ORDER — ONDANSETRON 8 MG PO TBDP: 8.0000 mg | ORAL_TABLET | Freq: Three times a day (TID) | ORAL | 0 refills | Status: DC | PRN

## 2021-07-02 MED ORDER — DICYCLOMINE HCL 10 MG PO CAPS: 10.0000 mg | ORAL_CAPSULE | Freq: Three times a day (TID) | ORAL | 0 refills | Status: DC | PRN

## 2021-07-02 NOTE — ED Triage Notes (Signed)
Patient to ER via Pov from home with complaints of weakness, nausea, and diarrhea. Reports symptoms started last night. Reports some lower abdominal cramping. Denies urinary symptoms.  ? ?States her boyfriend tested positive for the flu this morning.  ?

## 2021-07-02 NOTE — ED Notes (Signed)
Attempted for 22g at R ac and again at R fa.  ?

## 2021-07-02 NOTE — ED Provider Notes (Signed)
? ?[facility] ?Provider Note ? ? ? Event Date/Time  ? First MD Initiated Contact with Patient 07/02/21 1720   ?  (approximate) ? ? ?History  ? ?Nausea, Weakness, and Diarrhea ? ? ?HPI ? ?Suzanne Nelson is a 26 y.o. female with no active medical problems who presents with diarrhea since last night associated with nausea and vomiting today as well as some crampy mid abdominal pain.  She denies cough, shortness of breath, fever, or urinary symptoms.  Her boyfriend was diagnosed today with influenza, however she most recently saw him 3 or 4 days ago. ? ? ?Physical Exam  ? ?Triage Vital Signs: ?ED Triage Vitals  ?Enc Vitals Group  ?   BP 07/02/21 1616 (!) 144/90  ?   Pulse Rate 07/02/21 1616 97  ?   Resp 07/02/21 1616 17  ?   Temp 07/02/21 1616 97.7 ?F (36.5 ?C)  ?   Temp Source 07/02/21 1616 Oral  ?   SpO2 07/02/21 1616 97 %  ?   Weight 07/02/21 1616 188 lb (85.3 kg)  ?   Height 07/02/21 1616 5\' 3"  (1.6 m)  ?   Head Circumference --   ?   Peak Flow --   ?   Pain Score 07/02/21 1616 3  ?   Pain Loc --   ?   Pain Edu? --   ?   Excl. in GC? --   ? ? ?Most recent vital signs: ?Vitals:  ? 07/02/21 1844 07/02/21 1932  ?BP:  119/90  ?Pulse: 74 83  ?Resp:  20  ?Temp:    ?SpO2: 99% 100%  ? ? ? ?General: Alert, well-appearing. ?CV:  Good peripheral perfusion.  ?Resp:  Normal effort.  ?Abd:  Soft and nontender.  No distention.  ?Other:  Mucous membranes slightly dry. ? ? ?ED Results / Procedures / Treatments  ? ?Labs ?(all labs ordered are listed, but only abnormal results are displayed) ?Labs Reviewed  ?CBC - Abnormal; Notable for the following components:  ?    Result Value  ? WBC 11.2 (*)   ? RBC 5.30 (*)   ? Hemoglobin 15.4 (*)   ? HCT 46.9 (*)   ? All other components within normal limits  ?URINALYSIS, ROUTINE W REFLEX MICROSCOPIC - Abnormal; Notable for the following components:  ? Color, Urine YELLOW (*)   ? APPearance CLOUDY (*)   ? Leukocytes,Ua TRACE (*)   ? Bacteria, UA RARE (*)   ? All other  components within normal limits  ?RESP PANEL BY RT-PCR (FLU A&B, COVID) ARPGX2  ?LIPASE, BLOOD  ?COMPREHENSIVE METABOLIC PANEL  ?POC URINE PREG, ED  ? ? ? ?EKG ? ? ? ? ?RADIOLOGY ? ? ? ? ?PROCEDURES: ? ?Critical Care performed: No ? ?Procedures ? ? ?MEDICATIONS ORDERED IN ED: ?Medications  ?sodium chloride 0.9 % bolus 1,000 mL (1,000 mLs Intravenous New Bag/Given 07/02/21 1840)  ?ondansetron [facility]) injection 4 mg (4 mg Intravenous Given 07/02/21 1839)  ? ? ? ?IMPRESSION / MDM / ASSESSMENT AND PLAN / ED COURSE  ?I reviewed the triage vital signs and the nursing notes. ? ?26 year old female with no active medical problems presents with nausea, vomiting, and diarrhea as well as some crampy mid abdominal pain. ? ?On exam the patient is overall well-appearing and her vital signs are normal except for mild hypertension.  The abdomen is soft and nontender. ? ?Differential diagnosis includes, but is not limited to, viral gastroenteritis, foodborne illness, influenza, COVID-19, gastritis, gastroparesis.  Given the lack of abdominal tenderness or leukocytosis there is no indication for imaging.  We will treat symptomatically with fluids, antiemetics, and reassess. ? ?----------------------------------------- ?7:38 PM on 07/02/2021 ?----------------------------------------- ? ?Viral panel is negative.  The patient is feeling better.  She is stable for discharge home at this time.  Return precautions given, and she expresses understanding. ? ? ?FINAL CLINICAL IMPRESSION(S) / ED DIAGNOSES  ? ?Final diagnoses:  ?Gastroenteritis  ? ? ? ?Rx / DC Orders  ? ?ED Discharge Orders   ? ?      Ordered  ?  ondansetron (ZOFRAN-ODT) 8 MG disintegrating tablet  Every 8 hours PRN       ? 07/02/21 1937  ?  dicyclomine (BENTYL) 10 MG capsule  Every 8 hours PRN       ? 07/02/21 1937  ? ?  ?  ? ?  ? ? ? ?Note:  This document was prepared using Dragon voice recognition software and may include unintentional dictation errors.  ?  ?Dionne Bucy, MD ?07/02/21 1938 ? ?

## 2021-07-02 NOTE — ED Notes (Signed)
26 yof with a c/c of diarrhea, nausea, and has vomited once since last night. The pt advised she just feels weak.  ?

## 2021-07-02 NOTE — Discharge Instructions (Addendum)
Take the Zofran as needed for nausea and the Bentyl as needed for abdominal cramping or pain. ? ?Return to the ER for new, worsening, or persistent severe abdominal pain, vomiting, blood in the stool, fever, weakness, or any other new or worsening symptoms that concern you. ?

## 2021-07-28 ENCOUNTER — Ambulatory Visit (INDEPENDENT_AMBULATORY_CARE_PROVIDER_SITE_OTHER): Payer: Medicaid Other | Admitting: Obstetrics

## 2021-07-28 ENCOUNTER — Encounter: Payer: Self-pay | Admitting: Obstetrics

## 2021-07-28 VITALS — BP 100/60 | HR 86 | Ht 62.0 in | Wt 187.0 lb

## 2021-07-28 DIAGNOSIS — Z3009 Encounter for other general counseling and advice on contraception: Secondary | ICD-10-CM

## 2021-07-28 DIAGNOSIS — Z30432 Encounter for removal of intrauterine contraceptive device: Secondary | ICD-10-CM | POA: Diagnosis not present

## 2021-07-28 MED ORDER — NORETHIN ACE-ETH ESTRAD-FE 1-20 MG-MCG PO TABS: 1.0000 | ORAL_TABLET | Freq: Every day | ORAL | 2 refills | Status: DC

## 2021-07-28 NOTE — Progress Notes (Signed)
Patient is a 26 y.o. [id number] presenting for contraception consult.  She is currently on IUD and desiring to start OCP (estrogen/progesterone).  She has a past medical history significant for  MDD, and has no contraindication to being on estrogen. Hr IUD was placed a year ago, and over the last few months she has had severe cramping at her expected menses days. She is not having bleeding, just the heavy cramping, which has not responded to Midol or Tylenol or Motrin.  She specifically denies a history of migraine with aura, chronic hypertension, and history of DVT/PE.  Reported No LMP recorded. (Menstrual status: IUD).Marland Kitchen    ?BP 100/60   Pulse 86   Ht 5\' 2"  (1.575 m)   Wt 187 lb (84.8 kg)   BMI 34.20 kg/m?  ?Review of Systems  ?Constitutional: Negative.   ?HENT: Negative.    ?Eyes: Negative.   ?Respiratory: Negative.    ?Cardiovascular: Negative.   ?Gastrointestinal: Negative.   ?Genitourinary: Negative.   ?Musculoskeletal: Negative.   ?Skin: Negative.   ?Neurological: Negative.   ?Endo/Heme/Allergies: Negative.   ?Psychiatric/Behavioral:    ?     Hx of MDD and other mood issues  ?Physical Exam ?Constitutional:   ?   Appearance: Normal appearance.  ?HENT:  ?   Head: Normocephalic and atraumatic.  ?Pulmonary:  ?   Effort: Pulmonary effort is normal.  ?   Breath sounds: Normal breath sounds.  ?Abdominal:  ?   Palpations: Abdomen is soft.  ?Genitourinary: ?   General: Normal vulva.  ?   Rectum: Normal.  ?   Comments: No external lesions or areas of irritation. ?Normal vaginal rugae, scant vaginal bleeding noted with speculum exam. IUD strings visible, 2 cms in length. See note re IUD removal. ?Musculoskeletal:     ?   General: Normal range of motion.  ?   Cervical back: Normal range of motion and neck supple.  ?Skin: ?   General: Skin is warm and dry.  ?Neurological:  ?   Mental Status: She is alert.  ?  ?IUD Removal  ?Patient identified, informed consent performed, consent signed.  Patient was in the dorsal lithotomy  position, normal external genitalia was noted.  A speculum was placed in the patient's vagina, normal discharge was noted, no lesions. The cervix was visualized, no lesions, no abnormal discharge.  The strings of the IUD were grasped and pulled using ring forceps. The IUD was removed in its entirety.  Patient tolerated the procedure well.   ? ?Patient will use Junel OCPs for contraception.   Routine preventative health maintenance measures emphasized.  ? ?She is due for an annual GYN physical. Encouraged her to make an appointment in the next few months for her annual and pap smear. ? ? , CNM  ?07/28/2021 11:18 AM  ? ?

## 2021-08-15 ENCOUNTER — Other Ambulatory Visit: Payer: Self-pay

## 2021-08-15 ENCOUNTER — Encounter: Payer: Self-pay | Admitting: Emergency Medicine

## 2021-08-15 ENCOUNTER — Emergency Department
Admission: EM | Admit: 2021-08-15 | Discharge: 2021-08-15 | Disposition: A | Discharge status: Home / Self Care | Payer: Medicaid Other | Attending: Emergency Medicine | Admitting: Emergency Medicine

## 2021-08-15 DIAGNOSIS — K029 Dental caries, unspecified: Secondary | ICD-10-CM | POA: Diagnosis not present

## 2021-08-15 DIAGNOSIS — K0889 Other specified disorders of teeth and supporting structures: Secondary | ICD-10-CM | POA: Diagnosis present

## 2021-08-15 MED ORDER — LIDOCAINE VISCOUS HCL 2 % MT SOLN: 15.0000 mL | Freq: Four times a day (QID) | OROMUCOSAL | 0 refills | Status: DC | PRN

## 2021-08-15 MED ORDER — KETOROLAC TROMETHAMINE 30 MG/ML IJ SOLN
30.0000 mg | Freq: Once | INTRAMUSCULAR | Status: AC
  Administered 2021-08-15: 30 mg via INTRAMUSCULAR
  Filled 2021-08-15: qty 1

## 2021-08-15 NOTE — ED Provider Notes (Signed)
? ?[facility] ?Provider Note ? ? ? Event Date/Time  ? First MD Initiated Contact with Patient 08/15/21 1724   ?  (approximate) ? ? ?History  ? ?Dental Pain and Otalgia ? ? ?HPI ? ?Suzanne Nelson is a 26 y.o. female presents to the ER today with complaint of dental pain.  She reports this has been an ongoing issue for the last few months.  She describes the pain as aching and throbbing.  The pain starts in her left lower jaw and radiates into her left ear.  She is having difficulty chewing.  She denies ringing in the ear, decreased hearing or discharge from the left ear.  She denies fever, chills or body aches.  She reports she has reached out to her dentist who has not gotten back to her.  She reports the soonest appointment she could get for evaluation was in September 2023.  She has tried Ibuprofen and Tylenol OTC with minimal relief of symptoms. ? ?  ? ? ?Physical Exam  ? ?Triage Vital Signs: ?ED Triage Vitals  ?Enc Vitals Group  ?   BP 08/15/21 1722 (!) 109/58  ?   Pulse Rate 08/15/21 1722 83  ?   Resp 08/15/21 1722 16  ?   Temp 08/15/21 1722 97.9 ?F (36.6 ?C)  ?   Temp Source 08/15/21 1722 Oral  ?   SpO2 08/15/21 1722 96 %  ?   Weight 08/15/21 1716 187 lb 6.3 oz (85 kg)  ?   Height 08/15/21 1716 5\' 2"  (1.575 m)  ?   Head Circumference --   ?   Peak Flow --   ?   Pain Score 08/15/21 1716 9  ?   Pain Loc --   ?   Pain Edu? --   ?   Excl. in GC? --   ? ? ?Most recent vital signs: ?Vitals:  ? 08/15/21 1722  ?BP: (!) 109/58  ?Pulse: 83  ?Resp: 16  ?Temp: 97.9 ?F (36.6 ?C)  ?SpO2: 96%  ? ? ?General: Awake, no distress ?ENT:  Tooth #17 broken at the jawline.  Multiple teeth from 18-22 with decay noted along the gumline.  No gum swelling or redness noted.  Left ear: Small scabbed area noted at the posterior aspect of the tragus.  TM intact without effusion. ?CV:  Normal rate. ?Resp:  Normal effort.  ? ? ? ?ED Results / Procedures / Treatments  ? ? ? ? ?MEDICATIONS ORDERED IN ED: ?Medications   ?ketorolac (TORADOL) 30 MG/ML injection 30 mg (30 mg Intramuscular Given 08/15/21 1746)  ? ? ? ?IMPRESSION / MDM / ASSESSMENT AND PLAN / ED COURSE  ?I reviewed the triage vital signs and the nursing notes. ? ?Dental Pain: ? ?Differential diagnosis includes, but is not limited to, fracture tooth, tooth decay with exposed nerve root, dental abscess ? ?Exam c/w fractured tooth with multiple decayed teeth. No evidence of dental abscess ?Toradol 30 mg IM x 1 ?RX for Viscous Lidocaine 2% 4 x day prn ?She is agreeable with discharge, advised to reach out to her dentist on Monday for further evaluation ? ? ? ?FINAL CLINICAL IMPRESSION(S) / ED DIAGNOSES  ? ?Final diagnoses:  ?Pain due to dental caries  ? ? ? ?Rx / DC Orders  ? ?ED Discharge Orders   ? ?      Ordered  ?  lidocaine (XYLOCAINE) 2 % solution  Every 6 hours PRN       ? 08/15/21  1804  ? ?  ?  ? ?  ? ? ? ?Note:  This document was prepared using Dragon voice recognition software and may include unintentional dictation errors. ? ?  ?Lorre Munroe, NP ?08/15/21 1805 ? ?  ?Merwyn Katos, MD ?08/15/21 1905 ? ?

## 2021-08-15 NOTE — Discharge Instructions (Signed)
You were seen today for dental pain.  You have been diagnosed with dental caries.  You received a Toradol injection for pain and inflammation.  I prescribed you viscous lidocaine which you can swish and spit 4 times a day.  Please call and schedule follow-up appoint with your dentist. ?

## 2021-08-15 NOTE — ED Triage Notes (Signed)
Pt reports has a lot of messed up teeth on her left lower jaw and now the pain is radiating into her left ear. Pt states has called her dentist has not gotten back with her ?

## 2021-08-25 ENCOUNTER — Encounter: Payer: Self-pay | Admitting: Obstetrics

## 2021-08-25 ENCOUNTER — Other Ambulatory Visit (HOSPITAL_COMMUNITY)
Admission: RE | Admit: 2021-08-25 | Discharge: 2021-08-25 | Disposition: A | Discharge status: Home / Self Care | Payer: Medicaid Other | Source: Ambulatory Visit | Attending: Obstetrics | Admitting: Obstetrics

## 2021-08-25 ENCOUNTER — Ambulatory Visit (INDEPENDENT_AMBULATORY_CARE_PROVIDER_SITE_OTHER): Payer: Medicaid Other | Admitting: Obstetrics

## 2021-08-25 VITALS — BP 126/84 | Ht 62.0 in | Wt 183.0 lb

## 2021-08-25 DIAGNOSIS — Z113 Encounter for screening for infections with a predominantly sexual mode of transmission: Secondary | ICD-10-CM | POA: Diagnosis present

## 2021-08-25 DIAGNOSIS — Z3009 Encounter for other general counseling and advice on contraception: Secondary | ICD-10-CM

## 2021-08-25 DIAGNOSIS — Z3202 Encounter for pregnancy test, result negative: Secondary | ICD-10-CM

## 2021-08-25 DIAGNOSIS — Z0000 Encounter for general adult medical examination without abnormal findings: Secondary | ICD-10-CM

## 2021-08-25 DIAGNOSIS — Z124 Encounter for screening for malignant neoplasm of cervix: Secondary | ICD-10-CM

## 2021-08-25 DIAGNOSIS — Z01419 Encounter for gynecological examination (general) (routine) without abnormal findings: Secondary | ICD-10-CM | POA: Diagnosis not present

## 2021-08-25 LAB — POCT URINE PREGNANCY: Preg Test, Ur: NEGATIVE

## 2021-08-25 MED ORDER — NORETHIN ACE-ETH ESTRAD-FE 1-20 MG-MCG PO TABS: 1.0000 | ORAL_TABLET | Freq: Every day | ORAL | 2 refills | Status: DC

## 2021-08-25 NOTE — Addendum Note (Signed)
Addended by: Mirna Mires on: 08/25/2021 04:39 PM ? ? Modules accepted: Orders ? ?

## 2021-08-25 NOTE — Progress Notes (Signed)
? ? ? ?Gynecology Annual Exam   ?PCP: [facility] ? ?Chief Complaint:  ?Chief Complaint  ?Patient presents with  ? Annual Exam  ? ? ?History of Present Illness: Patient is a 26 y.o. [id number] presents for annual exam. The patient has no complaints today.  ? ?LMP: Patient's last menstrual period was 08/13/2021 (approximate). ?Average Interval: regular, 28 days ?Duration of flow: 7 days ?Heavy Menses: sometimes ?Clots: no ?Intermenstrual Bleeding: no ?Postcoital Bleeding: no ?Dysmenorrhea: no ? ?The patient is sexually active. She currently uses OCP (estrogen/progesterone) for contraception. She denies dyspareunia.  The patient does not perform self breast exams.  There is no notable family history of breast or ovarian cancer in her family. ? ?The patient wears seatbelts: yes.   The patient has regular exercise: no.   ? ?The patient admits to some current symptoms of depression.  She has been followed by Psychiatry for MDD, and today shares that she is off all of her medication. She chose to stop her medication and her provider agreed to follow her if she checks in monthly.She equates her need for medication due to family issues and her father's incarceration. She does admit to marijuana use, a nd there is an odor of MJ in the exam room today. ? ?Review of Systems: Review of Systems  ?Constitutional: Negative.   ?HENT: Negative.    ?Eyes: Negative.   ?Cardiovascular: Negative.   ?Gastrointestinal: Negative.   ?Genitourinary: Negative.   ?Musculoskeletal: Negative.   ?Skin: Negative.   ?Neurological: Negative.   ?Endo/Heme/Allergies: Negative.   ?Psychiatric/Behavioral:  Positive for depression. The patient is nervous/anxious.   ?     Documented hx. She decided to stop her mood medications, and also admits to using MJ to address mood issues. Is followed by Psychiatry monthly. ?PHQ score is 11 ?GAD is 9  ? ?Past Medical History:  ?Patient Active Problem List  ? Diagnosis Date Noted  ? Hydronephrosis  05/18/2015  ? Calculus of distal left ureter 05/18/2015  ? Left ureteral stone 05/18/2015  ? Major depressive disorder, single episode, moderate (HCC) 03/16/2011  ? 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 Oppositional defiant disorder 03/16/2011  ? ? ?Past Surgical History:  ?Past Surgical History:  ?Procedure Laterality Date  ? CYSTO N/A 05/19/2015  ? Procedure: CYSTO;  Surgeon: Vanna Scotland, MD;  Location: [facility] ORS;  Service: Urology;  Laterality: N/A;  ? STENT PLACE LEFT URETER ([facility] HX)    ? URETEROSCOPY WITH HOLMIUM LASER LITHOTRIPSY Left 05/19/2015  ? Procedure: URETEROSCOPY WITH HOLMIUM LASER LITHOTRIPSY;  Surgeon: Vanna Scotland, MD;  Location: [facility] ORS;  Service: Urology;  Laterality: Left;  ? ? ?Gynecologic History:  ?Patient's last menstrual period was 08/13/2021 (approximate). ?Contraception: OCP (estrogen/progesterone) ?Last Pap: Results were:09/2019 no abnormalities  ? ?Obstetric History: [id number] ? ?Family History:  ?Family History  ?Problem Relation Age of Onset  ? Diabetes Mellitus II Mother   ? Bipolar disorder Mother   ? Breast cancer Paternal Grandmother   ?     not sure of age  ? ? ?Social History:  ?Social History  ? ?Socioeconomic History  ? Marital status: Single  ?  Spouse name: Not on file  ? Number of children: Not on file  ? Years of education: Not on file  ? Highest education level: Not on file  ?Occupational History  ?  Comment: Geologist, engineering at [employer]  ?Tobacco Use  ? Smoking status: Some Days  ?  Packs/day: 0.50  ?  Types: Cigarettes  ? Smokeless tobacco: Never  ? Tobacco comments:  ?  quiting now  ?Vaping Use  ? Vaping Use: Former  ? Substances: CBD  ?Substance and Sexual Activity  ? Alcohol use: Not Currently  ?  Comment: socially  ? Drug use: Not Currently  ?  Frequency: 1.0 times per week  ?  Types: Marijuana  ?  Comment: Last use one month age, uses vape with CBD currently  ? Sexual activity: Yes  ?  Partners: Male  ?  Birth control/protection: Pill   ?Other Topics Concern  ? Not on file  ?Social History Narrative  ? ** Merged History Encounter **  ?    ? ** Merged History Encounter **  ?    ? Lives at home, independant  ? ?Social Determinants of Health  ? ?Financial Resource Strain: Not on file  ?Food Insecurity: Not on file  ?Transportation Needs: Not on file  ?Physical Activity: Not on file  ?Stress: Not on file  ?Social Connections: Not on file  ?Intimate Partner Violence: Not on file  ? ? ?Allergies:  ?Allergies  ?Allergen Reactions  ? Onion Swelling  ? Adhesive [Tape] Other (See Comments)  ?  Steri-Strips & Silk tape  ? Tape Rash  ?  Reaction to Paper tape. Pt denies reaction to tegaderm or surgical tape.  ? ? ?Medications: ?Prior to Admission medications   ?Medication Sig Start Date End Date Taking? Authorizing Provider  ?norethindrone-ethinyl estradiol-FE (JUNEL FE 1/20) 1-20 MG-MCG tablet Take 1 tablet by mouth daily. 07/28/21  Yes Mirna Mires, CNM  ?ondansetron (ZOFRAN-ODT) 8 MG disintegrating tablet Take 1 tablet (8 mg total) by mouth every 8 (eight) hours as needed for nausea or vomiting. 07/02/21  Yes Dionne Bucy, MD  ?acetaminophen (TYLENOL) 325 MG tablet Take 2 tablets (650 mg total) by mouth every 4 (four) hours as needed (for pain scale < 4). 05/20/20   Zipporah Plants, CNM  ?Prenatal Vit-Fe Fumarate-FA (MULTIVITAMIN-PRENATAL) 27-0.8 MG TABS tablet Take 1 tablet by mouth daily at 12 noon. ?Patient not taking: Reported on 08/25/2021    [provider]  ?albuterol (PROVENTIL HFA;VENTOLIN HFA) 108 (90 Base) MCG/ACT inhaler Inhale 2 puffs into the lungs every 6 (six) hours as needed for wheezing or shortness of breath. 05/22/18 10/28/19  Enid Derry, PA-C  ? ? ?Physical Exam ?Vitals: Blood pressure 126/84, height 5\' 2"  (1.575 m), weight 183 lb (83 kg), last menstrual period 08/13/2021, not currently breastfeeding. ? ?General: NAD ?HEENT: normocephalic, anicteric ?Thyroid: no enlargement, no palpable nodules ?Pulmonary: No increased  work of breathing, CTAB ?Cardiovascular: RRR, distal pulses 2+ ?Breast: Breast symmetrical, no tenderness, no palpable nodules or masses, no skin or nipple retraction present, no nipple discharge.  No axillary or supraclavicular lymphadenopathy. ?Abdomen: NABS, soft, non-tender, non-distended.  Umbilicus without lesions.  No hepatomegaly, splenomegaly or masses palpable. No evidence of hernia  ?Genitourinary: ? External: Normal external female genitalia.  Normal urethral meatus, normal Bartholin's and Skene's glands.   ? Vagina: Normal vaginal mucosa, no evidence of prolapse.   ? Cervix: Grossly normal in appearance, no bleeding ? Uterus: Non-enlarged, mobile, normal contour.  No CMT ? Adnexa: ovaries non-enlarged, no adnexal masses ? Rectal: deferred ? Lymphatic: no evidence of inguinal lymphadenopathy ?Extremities: no edema, erythema, or tenderness ?Neurologic: Grossly intact ?Psychiatric: mood appropriate, affect full ? ?Female chaperone present for pelvic and breast  portions of the physical exam ? ? ? ?Assessment: 26 y.o. 33 routine annual exam ? ?Plan: ?Problem List  Items Addressed This Visit   ?None ?Visit Diagnoses   ? ? Women's annual routine gynecological examination    -  Primary  ? Relevant Orders  ? POCT urine pregnancy (Completed)  ? Screen for STD (sexually transmitted disease)      ? Cervical cancer screening      ? ?  ? ? ?2) STI screening  wasoffered and accepted ? ?2)  ASCCP guidelines and rational discussed.  Patient opts for yearly screening interval ? ?3) Contraception - the patient is currently using  OCP (estrogen/progesterone).  She is happy with her current form of contraception and plans to continue I have renewed her Junel for the next year. ? ?4) Routine healthcare maintenance including cholesterol, diabetes screening discussed managed by PCP ? ?5) No follow-ups on file. ? ? ?Mirna MiresMargaret M Evone Arseneau, CNM  ?08/25/2021 2:18 PM  ? ?[facility] ?08/25/2021, 2:18  PM ? ? ?  ?

## 2021-08-27 LAB — CERVICOVAGINAL ANCILLARY ONLY
Bacterial Vaginitis (gardnerella): POSITIVE — AB
Chlamydia: POSITIVE — AB
Comment: NEGATIVE
Comment: NEGATIVE
Comment: NEGATIVE
Comment: NORMAL
Neisseria Gonorrhea: NEGATIVE
Trichomonas: NEGATIVE

## 2021-08-28 LAB — CYTOLOGY - PAP: Diagnosis: NEGATIVE

## 2021-09-02 ENCOUNTER — Other Ambulatory Visit: Payer: Self-pay | Admitting: Obstetrics

## 2021-09-02 ENCOUNTER — Encounter: Payer: Self-pay | Admitting: Obstetrics

## 2021-09-02 DIAGNOSIS — B9689 Other specified bacterial agents as the cause of diseases classified elsewhere: Secondary | ICD-10-CM

## 2021-09-02 DIAGNOSIS — A749 Chlamydial infection, unspecified: Secondary | ICD-10-CM

## 2021-09-02 MED ORDER — DOXYCYCLINE HYCLATE 100 MG PO CAPS: 100.0000 mg | ORAL_CAPSULE | Freq: Two times a day (BID) | ORAL | 0 refills | Status: DC

## 2021-09-02 MED ORDER — METRONIDAZOLE 0.75 % VA GEL: 1.0000 | Freq: Every day | VAGINAL | 0 refills | Status: AC

## 2021-09-02 NOTE — Progress Notes (Unsigned)
Called patient to inform her of + chlamydia and + BV results on recent testing. Have sent PRXs in for doxycycline and for Metrogel. Will notify the [facility] re the Chlamydia. Left the patient a VM as she did not pick up. She may call the office with questions. ? ?Mirna Mires, CNM  ?09/02/2021 6:45 PM  ? ?

## 2021-10-17 ENCOUNTER — Encounter: Payer: Self-pay | Admitting: Emergency Medicine

## 2021-10-17 ENCOUNTER — Other Ambulatory Visit: Payer: Self-pay

## 2021-10-17 ENCOUNTER — Emergency Department
Admission: EM | Admit: 2021-10-17 | Discharge: 2021-10-17 | Discharge status: Against Medical Advice | Payer: Medicaid Other | Attending: Emergency Medicine | Admitting: Emergency Medicine

## 2021-10-17 DIAGNOSIS — G43009 Migraine without aura, not intractable, without status migrainosus: Secondary | ICD-10-CM

## 2021-10-17 DIAGNOSIS — R112 Nausea with vomiting, unspecified: Secondary | ICD-10-CM

## 2021-10-17 LAB — CBC WITH DIFFERENTIAL/PLATELET
Abs Immature Granulocytes: 0.05 10*3/uL (ref 0.00–0.07)
Basophils Absolute: 0 10*3/uL (ref 0.0–0.1)
Basophils Relative: 0 %
Eosinophils Absolute: 0.1 10*3/uL (ref 0.0–0.5)
Eosinophils Relative: 1 %
HCT: 44.3 % (ref 36.0–46.0)
Hemoglobin: 14.9 g/dL (ref 12.0–15.0)
Immature Granulocytes: 0 %
Lymphocytes Relative: 10 %
Lymphs Abs: 1.6 10*3/uL (ref 0.7–4.0)
MCH: 29.9 pg (ref 26.0–34.0)
MCHC: 33.6 g/dL (ref 30.0–36.0)
MCV: 88.8 fL (ref 80.0–100.0)
Monocytes Absolute: 0.6 10*3/uL (ref 0.1–1.0)
Monocytes Relative: 4 %
Neutro Abs: 13.5 10*3/uL — ABNORMAL HIGH (ref 1.7–7.7)
Neutrophils Relative %: 85 %
Platelets: 304 10*3/uL (ref 150–400)
RBC: 4.99 MIL/uL (ref 3.87–5.11)
RDW: 12.7 % (ref 11.5–15.5)
WBC: 15.9 10*3/uL — ABNORMAL HIGH (ref 4.0–10.5)
nRBC: 0 % (ref 0.0–0.2)

## 2021-10-17 LAB — BASIC METABOLIC PANEL
Anion gap: 5 (ref 5–15)
BUN: 6 mg/dL (ref 6–20)
CO2: 25 mmol/L (ref 22–32)
Calcium: 8.9 mg/dL (ref 8.9–10.3)
Chloride: 108 mmol/L (ref 98–111)
Creatinine, Ser: 0.6 mg/dL (ref 0.44–1.00)
GFR, Estimated: >60 mL/min (ref >60)
Glucose, Bld: 105 mg/dL — ABNORMAL HIGH (ref 70–99)
Potassium: 4.1 mmol/L (ref 3.5–5.1)
Sodium: 138 mmol/L (ref 135–145)

## 2021-10-17 LAB — URINALYSIS, ROUTINE W REFLEX MICROSCOPIC
Bacteria, UA: NONE SEEN
Bilirubin Urine: NEGATIVE
Glucose, UA: NEGATIVE mg/dL
Hgb urine dipstick: NEGATIVE
Ketones, ur: 5 mg/dL — AB
Nitrite: NEGATIVE
Protein, ur: 30 mg/dL — AB
Specific Gravity, Urine: 1.027 (ref 1.005–1.030)
pH: 6 (ref 5.0–8.0)

## 2021-10-17 LAB — LACTIC ACID, PLASMA: Lactic Acid, Venous: 1 mmol/L (ref 0.5–1.9)

## 2021-10-17 LAB — POC URINE PREG, ED: Preg Test, Ur: NEGATIVE

## 2021-10-17 LAB — LIPASE, BLOOD: Lipase: 24 U/L (ref 11–51)

## 2021-10-17 MED ORDER — KETOROLAC TROMETHAMINE 30 MG/ML IJ SOLN
30.0000 mg | Freq: Once | INTRAMUSCULAR | Status: AC
  Administered 2021-10-17: 30 mg via INTRAVENOUS
  Filled 2021-10-17: qty 1

## 2021-10-17 MED ORDER — ONDANSETRON HCL 4 MG/2ML IJ SOLN
4.0000 mg | Freq: Once | INTRAMUSCULAR | Status: AC
  Administered 2021-10-17: 4 mg via INTRAVENOUS
  Filled 2021-10-17: qty 2

## 2021-10-17 MED ORDER — SODIUM CHLORIDE 0.9 % IV BOLUS
1000.0000 mL | Freq: Once | INTRAVENOUS | Status: AC
  Administered 2021-10-17: 1000 mL via INTRAVENOUS

## 2021-10-17 NOTE — ED Provider Notes (Signed)
[facility] Emergency Department Provider Note     Event Date/Time   First MD Initiated Contact with Patient 10/17/21 Suzanne Nelson Nelson     (approximate)   History   Migraine, Nausea, and Emesis   HPI  Suzanne Nelson Nelson is a 26 y.o. female history of migraines, ADHD, depression, presents to the ED for evaluation of migraine headache as well as emesis.  Patient reports awaking this morning feeling unwell, when she developed a typical right-sided migraine.  She began to experience persistent emesis of about 2 hours prior to arrival, noting 6 episodes of nonbloody, nonbilious emesis.  She reports nausea and vomiting associated with her typical migraine.  No reports of any recent injury, trauma, fall patient denies any fevers, chills, chest pain, shortness of breath.  Patient notes that the last time she felt a headache with persistent nausea and vomiting, she found out she was pregnant.  She reports she is on birth control and is not due to have her next menses for another 2 days.   Physical Exam   Triage Vital Signs: ED Triage Vitals  Enc Vitals Group     BP 10/17/21 1808 131/89     Pulse Rate 10/17/21 1808 83     Resp 10/17/21 1808 20     Temp 10/17/21 1808 98.6 F (37 C)     Temp Source 10/17/21 1808 Oral     SpO2 10/17/21 1808 99 %     Weight 10/17/21 1803 185 lb (83.9 kg)     Height 10/17/21 1803 5\' 2"  (1.575 m)     Head Circumference --      Peak Flow --      Pain Score 10/17/21 1802 8     Pain Loc --      Pain Edu? --      Excl. in GC? --     Most recent vital signs: Vitals:   10/17/21 2035 10/17/21 2203  BP: (!) 123/91 131/82  Pulse: 82 79  Resp: 17 15  Temp:  98.6 F (37 C)  SpO2: 98% 100%    General Awake, no distress. NAD HEENT NCAT. PERRL. EOMI. No rhinorrhea. Mucous membranes are moist.  CV:  Good peripheral perfusion.  RESP:  Normal effort.  ABD:  No distention.  NEURO: Cranial nerves II to XII grossly intact.  ED Results / Procedures  / Treatments   Labs (all labs ordered are listed, but only abnormal results are displayed) Labs Reviewed  BASIC METABOLIC PANEL - Abnormal; Notable for the following components:      Result Value   Glucose, Bld 105 (*)    All other components within normal limits  CBC WITH DIFFERENTIAL/PLATELET - Abnormal; Notable for the following components:   WBC 15.9 (*)    Neutro Abs 13.5 (*)    All other components within normal limits  URINALYSIS, ROUTINE W REFLEX MICROSCOPIC - Abnormal; Notable for the following components:   Color, Urine YELLOW (*)    APPearance HAZY (*)    Ketones, ur 5 (*)    Protein, ur 30 (*)    Leukocytes,Ua Suzanne Nelson (*)    All other components within normal limits  POC URINE PREG, ED - Normal  LIPASE, BLOOD  LACTIC ACID, PLASMA     EKG   RADIOLOGY   No results found.   PROCEDURES:  Critical Care performed: No  Procedures   MEDICATIONS ORDERED IN ED: Medications  sodium chloride 0.9 % bolus 1,000 mL (0 mLs Intravenous  Stopped 10/17/21 2034)  ondansetron (ZOFRAN) injection 4 mg (4 mg Intravenous Given 10/17/21 1914)  ketorolac (TORADOL) 30 MG/ML injection 30 mg (30 mg Intravenous Given 10/17/21 2031)     IMPRESSION / MDM / ASSESSMENT AND PLAN / ED COURSE  I reviewed the triage vital signs and the nursing notes.                              Differential diagnosis includes, but is not limited to, intracranial hemorrhage, meningitis/encephalitis, previous head trauma, cavernous venous thrombosis, tension headache, temporal arteritis, migraine or migraine equivalent, idiopathic intracranial hypertension, and non-specific headache.   Patient's presentation is most consistent with acute complicated illness / injury requiring diagnostic workup.  Patient's diagnosis is consistent with abdominal pain of unknown etiology. Patient left the ED prior to completion of her treatment.  She eloped prior to completing her work-up, and therefore has no discharge  instructions or prescription medications.  Clinical Course as of 10/19/21 2344  Sat Oct 17, 2021  2016 CBC with Differential(!) [JM]    Clinical Course User Index [JM] Keyunna Coco, Charlesetta Ivory, PA-C    FINAL CLINICAL IMPRESSION(S) / ED DIAGNOSES   Final diagnoses:  Migraine without aura and without status migrainosus, not intractable  Nausea vomiting and diarrhea     Rx / DC Orders   ED Discharge Orders     None        Note:  This document was prepared using Dragon voice recognition software and may include unintentional dictation errors.    Lissa Hoard, PA-C 10/19/21 2345    Shaune Pollack, MD 10/21/21 1101

## 2021-10-17 NOTE — ED Notes (Signed)
Pt and pt's mom at bedside getting agitated as wants to go home. Pt reports she needs to feed her chils who's not eaten since 6pm. Reports feels better, and will call her PCP in the morning for migraine meds. Pt states cannot wait for provider to  re evaluate her. IV removed and Pt walked out of ED

## 2021-10-17 NOTE — ED Triage Notes (Signed)
Pt reports HA and has vomited about 6 times today. Pt reports has hx of migraines that she does have NV with. Pt reports took tylenol and zofran with no relief.

## 2022-01-01 ENCOUNTER — Emergency Department: Payer: Medicaid Other

## 2022-01-01 ENCOUNTER — Other Ambulatory Visit: Payer: Self-pay

## 2022-01-01 ENCOUNTER — Emergency Department
Admission: EM | Admit: 2022-01-01 | Discharge: 2022-01-01 | Disposition: A | Discharge status: Home / Self Care | Payer: Medicaid Other | Attending: Emergency Medicine | Admitting: Emergency Medicine

## 2022-01-01 DIAGNOSIS — J45909 Unspecified asthma, uncomplicated: Secondary | ICD-10-CM | POA: Insufficient documentation

## 2022-01-01 DIAGNOSIS — N939 Abnormal uterine and vaginal bleeding, unspecified: Secondary | ICD-10-CM | POA: Insufficient documentation

## 2022-01-01 LAB — CBC WITH DIFFERENTIAL/PLATELET
Abs Immature Granulocytes: 0.03 10*3/uL (ref 0.00–0.07)
Basophils Absolute: 0.1 10*3/uL (ref 0.0–0.1)
Basophils Relative: 1 %
Eosinophils Absolute: 0.3 10*3/uL (ref 0.0–0.5)
Eosinophils Relative: 3 %
HCT: 42.6 % (ref 36.0–46.0)
Hemoglobin: 14.2 g/dL (ref 12.0–15.0)
Immature Granulocytes: 0 %
Lymphocytes Relative: 36 %
Lymphs Abs: 4.1 10*3/uL — ABNORMAL HIGH (ref 0.7–4.0)
MCH: 29.5 pg (ref 26.0–34.0)
MCHC: 33.3 g/dL (ref 30.0–36.0)
MCV: 88.6 fL (ref 80.0–100.0)
Monocytes Absolute: 0.6 10*3/uL (ref 0.1–1.0)
Monocytes Relative: 5 %
Neutro Abs: 6.5 10*3/uL (ref 1.7–7.7)
Neutrophils Relative %: 55 %
Platelets: 364 10*3/uL (ref 150–400)
RBC: 4.81 MIL/uL (ref 3.87–5.11)
RDW: 12.4 % (ref 11.5–15.5)
WBC: 11.7 10*3/uL — ABNORMAL HIGH (ref 4.0–10.5)
nRBC: 0 % (ref 0.0–0.2)

## 2022-01-01 LAB — TYPE AND SCREEN
ABO/RH(D): O POS
Antibody Screen: NEGATIVE

## 2022-01-01 LAB — URINALYSIS, ROUTINE W REFLEX MICROSCOPIC
Bacteria, UA: NONE SEEN
Bilirubin Urine: NEGATIVE
Glucose, UA: NEGATIVE mg/dL
Ketones, ur: NEGATIVE mg/dL
Leukocytes,Ua: NEGATIVE
Nitrite: NEGATIVE
Protein, ur: NEGATIVE mg/dL
Specific Gravity, Urine: 1.025 (ref 1.005–1.030)
pH: 5 (ref 5.0–8.0)

## 2022-01-01 LAB — COMPREHENSIVE METABOLIC PANEL
ALT: 18 U/L (ref 0–44)
AST: 18 U/L (ref 15–41)
Albumin: 3.9 g/dL (ref 3.5–5.0)
Alkaline Phosphatase: 93 U/L (ref 38–126)
Anion gap: 8 (ref 5–15)
BUN: 10 mg/dL (ref 6–20)
CO2: 25 mmol/L (ref 22–32)
Calcium: 9 mg/dL (ref 8.9–10.3)
Chloride: 107 mmol/L (ref 98–111)
Creatinine, Ser: 0.79 mg/dL (ref 0.44–1.00)
GFR, Estimated: >60 mL/min (ref >60)
Glucose, Bld: 101 mg/dL — ABNORMAL HIGH (ref 70–99)
Potassium: 4.1 mmol/L (ref 3.5–5.1)
Sodium: 140 mmol/L (ref 135–145)
Total Bilirubin: 0.5 mg/dL (ref 0.3–1.2)
Total Protein: 6.7 g/dL (ref 6.5–8.1)

## 2022-01-01 LAB — POC URINE PREG, ED: Preg Test, Ur: NEGATIVE

## 2022-01-01 LAB — HCG, QUANTITATIVE, PREGNANCY: hCG, Beta Chain, Quant, S: <1 m[IU]/mL (ref <5)

## 2022-01-01 MED ORDER — IBUPROFEN 800 MG PO TABS: 800.0000 mg | ORAL_TABLET | Freq: Three times a day (TID) | ORAL | 0 refills | Status: DC | PRN

## 2022-01-01 MED ORDER — TRANEXAMIC ACID 650 MG PO TABS: 1300.0000 mg | ORAL_TABLET | Freq: Three times a day (TID) | ORAL | 0 refills | Status: AC

## 2022-01-01 MED ORDER — ACETAMINOPHEN 500 MG PO TABS
1000.0000 mg | ORAL_TABLET | Freq: Once | ORAL | Status: AC
  Administered 2022-01-01: 1000 mg via ORAL
  Filled 2022-01-01: qty 2

## 2022-01-01 MED ORDER — IBUPROFEN 800 MG PO TABS
800.0000 mg | ORAL_TABLET | Freq: Once | ORAL | Status: AC
  Administered 2022-01-01: 800 mg via ORAL
  Filled 2022-01-01: qty 1

## 2022-01-01 NOTE — ED Triage Notes (Addendum)
Pt states she has not had period in seven months due to oral contraceptives, and c/o passing blood clot "the size of a fist" this morning and believes she may be miscarrying a pregnancy. Pt is AOX4, in NAD. PT c/o diffuse abdominal cramping that is constant, whooziness, and is saturating about 1 pad /hour at this time.

## 2022-01-01 NOTE — ED Notes (Signed)
No answer when called for a room by this RN.

## 2022-01-01 NOTE — Discharge Instructions (Addendum)
-  Discontinue your contraceptive pills at this time.  You may start the TXA pills tomorrow morning.  You may additionally take the ibuprofen as needed for abdominal cramping.  -Please follow-up with your OB/GYN and schedule an appointment as soon as possible.  -Return to the emergency department anytime if you begin to experience any new or worsening symptoms.

## 2022-01-01 NOTE — ED Notes (Addendum)
See triage note. Pt denies fever, injury, being around anyone sick, changes to urination or BM's; reports passed small clot of blood yesterday and larger clot today. Pt reports fatigue, chills, dizziness, weakness. Reports was once told was anemic and was on iron pills at that time; states hasn't been told to take them in recent years. Pt's resp reg/unlabored, skin dry and sitting calmly on stretcher. Pt c/o tenderness upon palpation across upper abdomen. Visitor remains with pt.

## 2022-01-01 NOTE — ED Provider Notes (Signed)
[facility] Provider Note    Event Date/Time   First MD Initiated Contact with Patient 01/01/22 1931     (approximate)   History   Chief Complaint Vaginal Bleeding   HPI Suzanne Nelson is a 26 y.o. female, history of MDD, ADHD, asthma, anxiety, migraine, presents to the emergency department for evaluation of vaginal bleeding.  Patient states she has not had a period in several months due to oral contraceptives.  Approximately 2 days ago, she passed a few small clots.  This morning, she states that she passed a clot the size of her fist.  Reports some mild diffuse abdominal cramping as well.  She states that she is currently saturating 1 pad per hour at this time.  Denies fevers/chills, chest pain, shortness of breath, dysuria, flank pain, back pain, diarrhea, dizziness/lightheadedness, rashes/lesions, or headache.  Denies any recent vaginal trauma or foreign bodies.  She states that she is not concerned for STIs at this time.  History Limitations: No limitations.        Physical Exam  Triage Vital Signs: ED Triage Vitals  Enc Vitals Group     BP 01/01/22 1820 123/80     Pulse Rate 01/01/22 1820 76     Resp 01/01/22 1820 18     Temp 01/01/22 1820 (!) 97.5 F (36.4 C)     Temp Source 01/01/22 1820 Oral     SpO2 01/01/22 1820 95 %     Weight 01/01/22 1821 185 lb (83.9 kg)     Height 01/01/22 1821 5\' 2"  (1.575 m)     Head Circumference --      Peak Flow --      Pain Score 01/01/22 1821 9     Pain Loc --      Pain Edu? --      Excl. in [place]? --     Most recent vital signs: Vitals:   01/01/22 1820 01/01/22 2138  BP: 123/80 106/70  Pulse: 76 71  Resp: 18 18  Temp: (!) 97.5 F (36.4 C)   SpO2: 95% 97%    General: Awake, NAD.  Skin: Warm, dry. No rashes or lesions.  Eyes: PERRL. Conjunctivae normal.  CV: Good peripheral perfusion.  Resp: Normal effort.  Abd: Soft, non-tender. No distention.  Neuro: At baseline. No gross neurological  deficits.  Musculoskeletal: Normal ROM of all extremities.   Focused Exam: N/A.  Physical Exam    ED Results / Procedures / Treatments  Labs (all labs ordered are listed, but only abnormal results are displayed) Labs Reviewed  CBC WITH DIFFERENTIAL/PLATELET - Abnormal; Notable for the following components:      Result Value   WBC 11.7 (*)    Lymphs Abs 4.1 (*)    All other components within normal limits  COMPREHENSIVE METABOLIC PANEL - Abnormal; Notable for the following components:   Glucose, Bld 101 (*)    All other components within normal limits  URINALYSIS, ROUTINE W REFLEX MICROSCOPIC - Abnormal; Notable for the following components:   Color, Urine YELLOW (*)    APPearance HAZY (*)    Hgb urine dipstick LARGE (*)    All other components within normal limits  HCG, QUANTITATIVE, PREGNANCY  POC URINE PREG, ED  POC URINE PREG, ED  TYPE AND SCREEN     EKG N/A.   RADIOLOGY  ED Provider Interpretation: I personally viewed and interpreted this ultrasound, no evidence of acute abnormalities.  2139 PELVIC COMPLETE WITH TRANSVAGINAL  Result  Date: 01/01/2022 CLINICAL DATA:  Pain, amenorrhea x7 months, vaginal bleeding x2 days EXAM: TRANSABDOMINAL AND TRANSVAGINAL ULTRASOUND OF PELVIS TECHNIQUE: Both transabdominal and transvaginal ultrasound examinations of the pelvis were performed. Transabdominal technique was performed for global imaging of the pelvis including uterus, ovaries, adnexal regions, and pelvic cul-de-sac. It was necessary to proceed with endovaginal exam following the transabdominal exam to visualize the endometrium. COMPARISON:  None Available. FINDINGS: Uterus Measurements: 8.3 x 3.6 x 4.8 cm = volume: 75 mL. No fibroids or other mass visualized. Endometrium Thickness: 3 mm.  No focal abnormality visualized. Right ovary Measurements: 2.2 x 1.6 x 2.1 cm = volume: 3.9 mL. Normal appearance/no adnexal mass. Left ovary Measurements: 3.2 x 2.3 x 2.1 cm = volume: 7.9  mL. Normal appearance/no adnexal mass. Other findings No abnormal free fluid. IMPRESSION: Negative pelvic ultrasound. Electronically Signed   By: Charline Bills M.D.   On: 01/01/2022 20:59    PROCEDURES:  Critical Care performed: N/A.  Procedures    MEDICATIONS ORDERED IN ED: Medications  ibuprofen (ADVIL) tablet 800 mg (800 mg Oral Given 01/01/22 2133)  acetaminophen (TYLENOL) tablet 1,000 mg (1,000 mg Oral Given 01/01/22 2133)     IMPRESSION / MDM / ASSESSMENT AND PLAN / ED COURSE  I reviewed the triage vital signs and the nursing notes.                              Differential diagnosis includes, but is not limited to, uterine fibroids, cervical polyps, PCOS, spontaneous abortion, abnormal uterine bleeding.  ED Course Patient appears well, vitals within normal limits.  NAD.  CBC shows mild leukocytosis at 11.7.  No anemia.  CMP shows no significant electrolyte abnormalities, AKI, or transaminitis.  Point-of-care pregnancy test negative.  Quantitative hCG less than 1.  Urinalysis shows large amounts of hemoglobin, otherwise no signs of infection.  Assessment/Plan Patient presents with significant vaginal bleeding x2 days.  Transvaginal ultrasound does not show any evidence of polyp/fibroids, or evidence of gestational pregnancy.  Point-of-care pregnancy test negative.  Quantitative hCG less than 1.  Unlikely spontaneous abortion.  No evidence of any serious or life-threatening pathology.  She is not anemic.  She is currently on oral contraceptive pills.  Spoke with the on-call OB/GYN specialist, Carie Caddy, who recommended TXA for 5 days for symptom management, though did advise her to not take her birth control pills at the same time as this would increase her risk of clots.  Discussed this plan with the patient, who agreed plan and agreed to discontinue her normal birth control pills.  States that she will touch base with her regular OB/GYN in regards to reinitiating  birth control pills after TXA.  I believe this is reasonable.  Will discharge.  Considered admission, but given her access to close follow-up and unremarkable work-up, she is unlikely to benefit from admission.  Provided the patient with anticipatory guidance, return precautions, and educational material. Encouraged the patient to return to the emergency department at any time if they begin to experience any new or worsening symptoms. Patient expressed understanding and agreed with the plan.   Patient's presentation is most consistent with acute complicated illness / injury requiring diagnostic workup.     FINAL CLINICAL IMPRESSION(S) / ED DIAGNOSES   Final diagnoses:  Vaginal bleeding     Rx / DC Orders   ED Discharge Orders          Ordered  tranexamic acid (LYSTEDA) 650 MG TABS tablet  3 times daily        01/01/22 2147    ibuprofen (ADVIL) 800 MG tablet  Every 8 hours PRN        01/01/22 2147             Note:  This document was prepared using Dragon voice recognition software and may include unintentional dictation errors.   Varney Daily, [place] 01/01/22 2154    Phineas Semen, MD 01/01/22 2219

## 2022-06-21 ENCOUNTER — Encounter: Payer: Self-pay | Admitting: Obstetrics

## 2022-06-23 ENCOUNTER — Ambulatory Visit (INDEPENDENT_AMBULATORY_CARE_PROVIDER_SITE_OTHER): Payer: Medicaid Other | Admitting: Obstetrics & Gynecology

## 2022-06-23 ENCOUNTER — Encounter: Payer: Self-pay | Admitting: Obstetrics & Gynecology

## 2022-06-23 ENCOUNTER — Other Ambulatory Visit (HOSPITAL_COMMUNITY)
Admission: RE | Admit: 2022-06-23 | Discharge: 2022-06-23 | Disposition: A | Discharge status: Home / Self Care | Payer: Medicaid Other | Source: Ambulatory Visit | Attending: Obstetrics & Gynecology | Admitting: Obstetrics & Gynecology

## 2022-06-23 VITALS — BP 120/70 | Ht 62.0 in | Wt 174.0 lb

## 2022-06-23 DIAGNOSIS — R102 Pelvic and perineal pain: Secondary | ICD-10-CM | POA: Diagnosis present

## 2022-06-23 DIAGNOSIS — O039 Complete or unspecified spontaneous abortion without complication: Secondary | ICD-10-CM

## 2022-06-23 NOTE — Addendum Note (Signed)
Addended by: Quintella Baton D on: 06/23/2022 04:27 PM   Modules accepted: Orders

## 2022-06-23 NOTE — Progress Notes (Signed)
    GYNECOLOGY PROGRESS NOTE  Subjective:    Patient ID: Suzanne Nelson, female    DOB: 27-Nov-1995, 27 y.o.   MRN: [id number]  HPI  Patient is a 27 y.o. [id number] female here with her mom with a 2 day h/o pain that covers the area from her symphysis pubis to her xyphoid on the front and back. She reports radiation of pain down her right anterior thigh. She has tried IBU 400 mg q6-8 hours but doesn't like to take tylenol. She is using condoms for contraception. She says that she had a miscarriage 04/2022 and this was managed at a local clinic.   She has a h/o kidney stones but doesn't think this pain feels the same.   Review of Systems  She works at a [employer] at [employer].   Objective:   Blood pressure 120/70, height '5\' 2"'$  (1.575 m), weight 174 lb (78.9 kg), last menstrual period 06/03/2022. Body mass index is 31.83 kg/m. General appearance: alert Abdomen: soft, non-tender; bowel sounds normal; no masses,  no organomegaly Pelvic: cervix normal in appearance, external genitalia normal, no adnexal masses or tenderness, no cervical motion tenderness, uterus normal size, shape, and consistency, and vagina normal without discharge, pelvic exam reassuring Extremities: extremities normal, atraumatic, no cyanosis or edema Neurologic: Grossly normal   Assessment:   Abdominopelvic pain  H/o recent miscarriage  Plan:   Reassurance given but I have ordered an ultrasound to assure her. Check QBHCG. Check cervical cultures

## 2022-06-24 LAB — BETA HCG QUANT (REF LAB): hCG Quant: <1 m[IU]/mL

## 2022-06-30 ENCOUNTER — Ambulatory Visit
Admission: RE | Admit: 2022-06-30 | Discharge: 2022-06-30 | Disposition: A | Discharge status: Home / Self Care | Payer: Medicaid Other | Source: Ambulatory Visit | Attending: Obstetrics & Gynecology | Admitting: Obstetrics & Gynecology

## 2022-06-30 DIAGNOSIS — R102 Pelvic and perineal pain: Secondary | ICD-10-CM | POA: Diagnosis present

## 2022-06-30 LAB — CERVICOVAGINAL ANCILLARY ONLY
Chlamydia: POSITIVE — AB
Comment: NEGATIVE
Comment: NORMAL
Neisseria Gonorrhea: NEGATIVE

## 2022-07-01 ENCOUNTER — Other Ambulatory Visit: Payer: Self-pay | Admitting: Obstetrics & Gynecology

## 2022-07-01 ENCOUNTER — Encounter: Payer: Self-pay | Admitting: Obstetrics & Gynecology

## 2022-07-01 ENCOUNTER — Telehealth: Payer: Self-pay | Admitting: Obstetrics & Gynecology

## 2022-07-01 DIAGNOSIS — A749 Chlamydial infection, unspecified: Secondary | ICD-10-CM

## 2022-07-01 MED ORDER — DOXYCYCLINE HYCLATE 100 MG PO CAPS: 100.0000 mg | ORAL_CAPSULE | Freq: Two times a day (BID) | ORAL | 0 refills | Status: DC

## 2022-07-01 NOTE — Telephone Encounter (Signed)
Please advise 

## 2022-07-01 NOTE — Telephone Encounter (Signed)
I contacted the patient via phone. I left generic message for the patient to call back to be scheduled.

## 2022-07-01 NOTE — Progress Notes (Unsigned)
Doxy prescribed to treat + CT I sent her a Mychart message. She had this in May 2023 as well, no test of cure

## 2022-07-01 NOTE — Telephone Encounter (Signed)
-----   Message from Emily Filbert, MD sent at 07/01/2022  1:06 PM EDT ----- She will need test of cure (TOC) in about a month. Thanks

## 2022-07-07 ENCOUNTER — Telehealth: Payer: Self-pay

## 2022-07-07 ENCOUNTER — Ambulatory Visit (LOCAL_COMMUNITY_HEALTH_CENTER): Payer: Medicaid Other

## 2022-07-07 VITALS — BP 120/60 | Ht 62.25 in | Wt 176.0 lb

## 2022-07-07 DIAGNOSIS — Z3201 Encounter for pregnancy test, result positive: Secondary | ICD-10-CM

## 2022-07-07 LAB — PREGNANCY, URINE: Preg Test, Ur: POSITIVE — AB

## 2022-07-07 MED ORDER — PRENATAL VITAMINS 28-0.8 MG PO TABS: 28.0000 mg | ORAL_TABLET | Freq: Every day | ORAL | 0 refills | Status: AC

## 2022-07-07 MED ORDER — AZITHROMYCIN 500 MG PO TABS: 1000.0000 mg | ORAL_TABLET | Freq: Once | ORAL | 1 refills | Status: AC

## 2022-07-07 NOTE — Telephone Encounter (Signed)
Contacted patient regarding concerns of recent prescription of doxycycline for treatment of Chlamydia. Patient newly pregnant, had confirmation at [facility] today. Advised patient not to take the doxycycline prescription at this time, and will send prescription for Azithromycin.  Discussed partner testing and treatment, notes that [facility]. Will need test of cure in 4-6 weeks.   Dr. Marcelline Mates

## 2022-07-07 NOTE — Progress Notes (Signed)
UPT positive.  Pt's mother with her at visit and pt wants her in room during visit. Pt has not decided where she wants to receive prenatal care; possibly [facility] or [facility].  Positive pregnancy packet given and reviewed with pt.  Also gave literature regarding smoking and pregnancy. See pregnancy flowsheet.  Pt states she is on doxycycline for chlamydia.  States Rx'ed by [facility].  Per epic note pt had contacted CMA about doxycycline and pregnancy and told OK to take.  Pt states she has only taken one pill yesterday.  RN instructed pt to call her provider and inform of positive pregnancy test and taking doxycycline; pt agreed.   The patient was dispensed prenatal vitamins today. I provided counseling today regarding the medication. Patient given the opportunity to ask questions. Questions answered.    Tonny Branch, RN

## 2022-07-07 NOTE — Telephone Encounter (Signed)
Pt calling; states we gave her an antibx for chlamydia - doxy; was told by HD today at pregnancy confirmation that under no circumstances was doxy to be given to a preg pt as it is not safe for the baby. Pt is now uncomfortable about this and would like to talk to someone. She does realize that chlamydia isn't safe for the baby either.  [phone]

## 2022-07-08 NOTE — Telephone Encounter (Signed)
The patient is scheduled for 4/17 for nurse visit.

## 2022-07-08 NOTE — Telephone Encounter (Signed)
Patient patient was scheduled for 4/17 nurse visit, per Caren Griffins on 07/07/21.

## 2022-07-09 ENCOUNTER — Emergency Department
Admission: EM | Admit: 2022-07-09 | Discharge: 2022-07-09 | Disposition: A | Discharge status: Home / Self Care | Payer: Medicaid Other | Attending: Emergency Medicine | Admitting: Emergency Medicine

## 2022-07-09 ENCOUNTER — Other Ambulatory Visit: Payer: Self-pay

## 2022-07-09 ENCOUNTER — Emergency Department: Payer: Medicaid Other

## 2022-07-09 DIAGNOSIS — R1011 Right upper quadrant pain: Secondary | ICD-10-CM | POA: Diagnosis not present

## 2022-07-09 DIAGNOSIS — Z3A01 Less than 8 weeks gestation of pregnancy: Secondary | ICD-10-CM | POA: Diagnosis not present

## 2022-07-09 DIAGNOSIS — O26891 Other specified pregnancy related conditions, first trimester: Secondary | ICD-10-CM | POA: Insufficient documentation

## 2022-07-09 DIAGNOSIS — R42 Dizziness and giddiness: Secondary | ICD-10-CM | POA: Diagnosis not present

## 2022-07-09 DIAGNOSIS — O209 Hemorrhage in early pregnancy, unspecified: Secondary | ICD-10-CM

## 2022-07-09 DIAGNOSIS — R109 Unspecified abdominal pain: Secondary | ICD-10-CM

## 2022-07-09 DIAGNOSIS — Z3491 Encounter for supervision of normal pregnancy, unspecified, first trimester: Secondary | ICD-10-CM

## 2022-07-09 DIAGNOSIS — R102 Pelvic and perineal pain: Secondary | ICD-10-CM

## 2022-07-09 LAB — URINALYSIS, ROUTINE W REFLEX MICROSCOPIC
Bacteria, UA: NONE SEEN
Bilirubin Urine: NEGATIVE
Glucose, UA: NEGATIVE mg/dL
Ketones, ur: NEGATIVE mg/dL
Nitrite: NEGATIVE
Protein, ur: NEGATIVE mg/dL
Specific Gravity, Urine: 1.008 (ref 1.005–1.030)
pH: 5 (ref 5.0–8.0)

## 2022-07-09 LAB — CBC
HCT: 42.8 % (ref 36.0–46.0)
Hemoglobin: 14.4 g/dL (ref 12.0–15.0)
MCH: 29.9 pg (ref 26.0–34.0)
MCHC: 33.6 g/dL (ref 30.0–36.0)
MCV: 89 fL (ref 80.0–100.0)
Platelets: 389 10*3/uL (ref 150–400)
RBC: 4.81 MIL/uL (ref 3.87–5.11)
RDW: 12.6 % (ref 11.5–15.5)
WBC: 12.6 10*3/uL — ABNORMAL HIGH (ref 4.0–10.5)
nRBC: 0 % (ref 0.0–0.2)

## 2022-07-09 LAB — COMPREHENSIVE METABOLIC PANEL
ALT: 22 U/L (ref 0–44)
AST: 21 U/L (ref 15–41)
Albumin: 4.2 g/dL (ref 3.5–5.0)
Alkaline Phosphatase: 85 U/L (ref 38–126)
Anion gap: 7 (ref 5–15)
BUN: 7 mg/dL (ref 6–20)
CO2: 20 mmol/L — ABNORMAL LOW (ref 22–32)
Calcium: 8.9 mg/dL (ref 8.9–10.3)
Chloride: 109 mmol/L (ref 98–111)
Creatinine, Ser: 0.58 mg/dL (ref 0.44–1.00)
GFR, Estimated: >60 mL/min (ref >60)
Glucose, Bld: 94 mg/dL (ref 70–99)
Potassium: 3.9 mmol/L (ref 3.5–5.1)
Sodium: 136 mmol/L (ref 135–145)
Total Bilirubin: 0.8 mg/dL (ref 0.3–1.2)
Total Protein: 7.3 g/dL (ref 6.5–8.1)

## 2022-07-09 LAB — LIPASE, BLOOD: Lipase: 30 U/L (ref 11–51)

## 2022-07-09 LAB — HCG, QUANTITATIVE, PREGNANCY: hCG, Beta Chain, Quant, S: 2365 m[IU]/mL — ABNORMAL HIGH (ref <5)

## 2022-07-09 MED ORDER — ONDANSETRON 4 MG PO TBDP: 4.0000 mg | ORAL_TABLET | Freq: Three times a day (TID) | ORAL | 0 refills | Status: DC | PRN

## 2022-07-09 MED ORDER — ACETAMINOPHEN 500 MG PO TABS
1000.0000 mg | ORAL_TABLET | Freq: Once | ORAL | Status: AC
  Administered 2022-07-09: 1000 mg via ORAL
  Filled 2022-07-09: qty 2

## 2022-07-09 MED ORDER — ONDANSETRON 8 MG PO TBDP
8.0000 mg | ORAL_TABLET | Freq: Once | ORAL | Status: AC
  Administered 2022-07-09: 8 mg via ORAL
  Filled 2022-07-09: qty 1

## 2022-07-09 NOTE — ED Notes (Signed)
Patient walked with steady gait to hallway bathroom to give urine specimen

## 2022-07-09 NOTE — ED Provider Notes (Signed)
[facility] Provider Note    Event Date/Time   First MD Initiated Contact with Patient 07/09/22 1109     (approximate)   History   Chief Complaint: Abdominal Pain   HPI  Suzanne Nelson is a 27 y.o. female who comes ED complaining of abdominal cramping and feeling lightheaded this morning.  No vaginal bleeding or discharge.  Does report being [redacted] weeks pregnant.  Symptoms started after taking 1000 mg of azithromycin for chlamydia treatment.  No vomiting or fever.  No diarrhea, no syncope or dysuria   [facility] 05/02/2022  EXAM: US OB TRANSVAGINAL  CLINICAL INDICATION: 1st trimester bleeding and pain; pls eval ectopic   LMP:  UNSURE   HCG 11   No definite intrauterine pregnancy identified.  Given positive pregnancy test, this represents a pregnancy of unknown location. Differential diagnosis includes early intrauterine pregnancy, failed pregnancy, and ectopic pregnancy. Recommend clinical correlation, serial quantitative beta HCGs, ectopic precautions, and follow-up ultrasound as clinically appropriate.      Physical Exam   Triage Vital Signs: ED Triage Vitals  Enc Vitals Group     BP 07/09/22 1105 127/77     Pulse Rate 07/09/22 1105 86     Resp 07/09/22 1105 18     Temp 07/09/22 1105 (!) 97.3 F (36.3 C)     Temp Source 07/09/22 1105 Oral     SpO2 07/09/22 1105 98 %     Weight --      Height --      Head Circumference --      Peak Flow --      Pain Score 07/09/22 1037 5     Pain Loc --      Pain Edu? --      Excl. in [place]? --     Most recent vital signs: Vitals:   07/09/22 1105 07/09/22 1528  BP: 127/77 110/65  Pulse: 86 70  Resp: 18 16  Temp: (!) 97.3 F (36.3 C)   SpO2: 98% 99%    General: Awake, no distress.  CV:  Good peripheral perfusion.  Regular rate rhythm Resp:  Normal effort.  Clear to auscultation bilaterally Abd:  No distention.  Soft with mild right upper quadrant tenderness and suprapubic tenderness Other:  No inflammatory  changes, moist oral mucosa.   ED Results / Procedures / Treatments   Labs (all labs ordered are listed, but only abnormal results are displayed) Labs Reviewed  COMPREHENSIVE METABOLIC PANEL - Abnormal; Notable for the following components:      Result Value   CO2 20 (*)    All other components within normal limits  CBC - Abnormal; Notable for the following components:   WBC 12.6 (*)    All other components within normal limits  URINALYSIS, ROUTINE W REFLEX MICROSCOPIC - Abnormal; Notable for the following components:   Color, Urine YELLOW (*)    APPearance HAZY (*)    Hgb urine dipstick SMALL (*)    Leukocytes,Ua TRACE (*)    All other components within normal limits  HCG, QUANTITATIVE, PREGNANCY - Abnormal; Notable for the following components:   hCG, Beta Chain, Quant, S 2,365 (*)    All other components within normal limits  LIPASE, BLOOD     EKG    RADIOLOGY Ultrasound right upper quadrant interpreted by me, negative for gallstones or signs of cholecystitis.  Ultrasound OB first trimester shows intrauterine fluid collection without embryonic features, unable to confirm IUP.   PROCEDURES:  Procedures  MEDICATIONS ORDERED IN ED: Medications  ondansetron (ZOFRAN-ODT) disintegrating tablet 8 mg (8 mg Oral Given 07/09/22 1156)  acetaminophen (TYLENOL) tablet 1,000 mg (1,000 mg Oral Given 07/09/22 1157)     IMPRESSION / MDM / ASSESSMENT AND PLAN / ED COURSE  I reviewed the triage vital signs and the nursing notes.  DDx: Physiologic first trimester pregnancy, ectopic pregnancy, UTI, electrolyte abnormality, symptomatic STI, anxiety  Patient's presentation is most consistent with acute presentation with potential threat to life or bodily function.  Patient comes to the ED with some cramping abdominal pain after taking azithromycin this morning.  Vital signs and exam are unremarkable.  Labs are essentially normal, beta hCG is 2300.  Ultrasounds negative.  Patient  feeling better after Tylenol and Zofran and stable for discharge home.  Patient counseled to follow-up with obstetrics for further evaluation including repeat hCG testing and repeat ultrasound to confirm IUP and exclude ectopic.        FINAL CLINICAL IMPRESSION(S) / ED DIAGNOSES   Final diagnoses:  Abdominal pain, unspecified abdominal location  First trimester pregnancy     Rx / DC Orders   ED Discharge Orders          Ordered    ondansetron (ZOFRAN-ODT) 4 MG disintegrating tablet  Every 8 hours PRN        07/09/22 1509             Note:  This document was prepared using Dragon voice recognition software and may include unintentional dictation errors.   Carrie Mew, MD 07/09/22 [phone]

## 2022-07-09 NOTE — ED Triage Notes (Addendum)
Pt to ED via [facility] from work. Pt took azithromycin this morning after she ate and started cramping and feeling light headed. Pt is currently [redacted]wks pregnant. Pt denies vaginal bleeding. Pt with hx 2 miscarriages.   EMS VS:  124/76 100% RA 98 HR  CBG 101 98.6

## 2022-07-12 ENCOUNTER — Telehealth: Payer: Self-pay | Admitting: Family Medicine

## 2022-07-12 NOTE — Telephone Encounter (Signed)
Client to [facility] ED 07/09/2022 for abd crampin and serial beta hcgs with follow-up US recommended. Per EChesley Mires CNM, schedule appt for week week after initial beta hcg. As agency closed 07/16/22 and due to client's work schedule, OB problem appt scheduled for 07/21/2022 for beta hcg, provider consult and scheduling of follow-up US appt. Client counseled [place] appt can be scheduled while she is in clinic if ordered at [facility]. Rich Number, RN

## 2022-07-12 NOTE — Telephone Encounter (Signed)
Patient called and was told by the ED that she needs to contact the ObGYN and she is using for prenatal care. She went to the ED with cramping and was told she needs an ultrasound done by [facility] at 6weeks. If someone could please call her. Thank you

## 2022-07-21 ENCOUNTER — Ambulatory Visit: Payer: Medicaid Other | Admitting: Family Medicine

## 2022-07-21 ENCOUNTER — Encounter: Payer: Self-pay | Admitting: Family Medicine

## 2022-07-21 VITALS — BP 112/70 | HR 78 | Temp 97.4°F | Wt 176.6 lb

## 2022-07-21 DIAGNOSIS — Z8759 Personal history of other complications of pregnancy, childbirth and the puerperium: Secondary | ICD-10-CM | POA: Insufficient documentation

## 2022-07-21 DIAGNOSIS — O3680X0 Pregnancy with inconclusive fetal viability, not applicable or unspecified: Secondary | ICD-10-CM

## 2022-07-21 DIAGNOSIS — O209 Hemorrhage in early pregnancy, unspecified: Secondary | ICD-10-CM

## 2022-07-21 NOTE — Progress Notes (Signed)
[place] OB Problem Visit  [facility] Department  Subjective:  SHAWNEEQUA VICTORIA is a 27 y.o. being seen today for   Chief Complaint  Patient presents with   OB Problem    HPI  Health Maintenance Due  Topic Date Due   COVID-19 Vaccine (1) Never done   Hepatitis C Screening  Never done    ROS  The following portions of the patient's history were reviewed and updated as appropriate: allergies, current medications, past family history, past medical history, past social history, past surgical history and problem list. Problem list updated.   See flowsheet for other program required questions.  Objective:   Vitals:   07/21/22 1027  BP: 112/70  Pulse: 78  Temp: (!) 97.4 F (36.3 C)  Weight: 80.1 kg    Physical Exam deferred   Assessment and Plan:  LADASHA COPELAN is a 27 y.o. female presenting to the [facility] Department for follow up OB problem.   1. 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  Future Appointments  Date Time Provider [place]  07/29/2022  4:00 PM [facility]  08/04/2022 11:15 AM [facility] None    Sharlet Salina, FNP

## 2022-07-21 NOTE — Progress Notes (Signed)
Viability/dating [place] scheduled today in clinic. [place] scheduled for 07/29/22 at [facility] at [place]. Patient made aware.   Al Decant, RN

## 2022-07-27 ENCOUNTER — Telehealth: Payer: Self-pay | Admitting: Family Medicine

## 2022-07-27 NOTE — Telephone Encounter (Signed)
Call to client who desires to know 07/21/2022 beta hcg results from [facility] appt (to [facility] ED for vaginal spotting 07/23/2022 and hcg = 66,000).   Counseled that results not yet available in [facility] for review. Also counseled this RN called [facility] prior to calling client and Joen Laura stated results not yet available. Joen Laura stated would e-mail the resulting lab notifying them facility requesting results and to call back 07/28/22 if not available by noon. Client voicing displeasure at length of time to receive results and stated aware was not [facility] fault. Encouraged client to call back tomorrow in early pm and ask for [facility] regarding lab result (this RN scheduled off tomorrow). Jossie Ng, RN

## 2022-07-27 NOTE — Telephone Encounter (Signed)
Please call me back with my blood work results

## 2022-07-29 ENCOUNTER — Other Ambulatory Visit: Payer: Self-pay | Admitting: Family Medicine

## 2022-07-29 ENCOUNTER — Ambulatory Visit
Admission: RE | Admit: 2022-07-29 | Discharge: 2022-07-29 | Disposition: A | Discharge status: Home / Self Care | Payer: Medicaid Other | Source: Ambulatory Visit | Attending: Family Medicine | Admitting: Family Medicine

## 2022-07-29 DIAGNOSIS — O3680X0 Pregnancy with inconclusive fetal viability, not applicable or unspecified: Secondary | ICD-10-CM

## 2022-07-29 DIAGNOSIS — Z8759 Personal history of other complications of pregnancy, childbirth and the puerperium: Secondary | ICD-10-CM

## 2022-08-02 ENCOUNTER — Encounter: Payer: Self-pay | Admitting: Family Medicine

## 2022-08-02 DIAGNOSIS — O208 Other hemorrhage in early pregnancy: Secondary | ICD-10-CM

## 2022-08-02 HISTORY — DX: Other hemorrhage in early pregnancy: O20.8

## 2022-08-04 ENCOUNTER — Telehealth: Payer: Self-pay

## 2022-08-04 ENCOUNTER — Ambulatory Visit: Payer: Medicaid Other

## 2022-08-04 NOTE — Telephone Encounter (Signed)
No other concern.

## 2022-08-10 ENCOUNTER — Telehealth: Payer: Self-pay

## 2022-08-10 NOTE — Telephone Encounter (Signed)
TC to patient to get her scheduled for a new OB appointment. Patient was scheduled for an MH follow-up appointment on 08/13/22, but has not yet had a new OB appointment. There was some confusion about what her 08/13/22 appointment was for, but patient aware that we are cancelling that appointment and she will come on Tuesday 08/17/22 for her new OB appointment. She is aware that she will need to choose a delivering hospital when she does her paperwork and if she chooses [facility] she will need to choose a delivering provider, either [facility] or [facility]. She was counseled that if she chooses [facility] for delivery, she will need to go there for her U/S appointments. She states she will talk with her partner about hospital options and rides. Patient counseled to arrive at 12:45 on 08/17/2022 and that she will be here for a long appointment ~ 3 hours for the first visit. Patient states understanding. Burt Knack, RN

## 2022-08-11 LAB — BETA HCG QUANT (REF LAB)

## 2022-08-11 NOTE — Progress Notes (Signed)
TC to [facility] and spoke with Bjorn Loser. The patient Beta Hcg ordered on 07/22/2022 is not resulted. Bjorn Loser states she will send an email to follow-up and see if she can move the process along.Burt Knack, RN

## 2022-08-12 ENCOUNTER — Telehealth: Payer: Self-pay

## 2022-08-12 NOTE — Telephone Encounter (Signed)
TC to [facility] and spoke with Lupita Leash. Patient Beta Hcg came back today stating the test was cancelled due to deterioration of the specimen collected on 07/21/22. An RN had called [facility] on 07/27/22 to ask about sample results and was told an email would be sent to move results along. Per Lupita Leash, the notes she sees states there was a lab accident that made that blood sample unsuitable for completing the test. This issue happened at [facility] and not at the lab at [facility]. Lupita Leash states she will have a service rep reach out to this RN to follow-up on lab issues.Burt Knack, RN

## 2022-08-13 ENCOUNTER — Ambulatory Visit: Payer: Medicaid Other

## 2022-08-17 ENCOUNTER — Encounter: Payer: Self-pay | Admitting: Family Medicine

## 2022-08-17 ENCOUNTER — Ambulatory Visit: Payer: Medicaid Other | Admitting: Family Medicine

## 2022-08-17 VITALS — BP 116/64 | HR 76 | Temp 97.0°F | Wt 174.8 lb

## 2022-08-17 DIAGNOSIS — G43011 Migraine without aura, intractable, with status migrainosus: Secondary | ICD-10-CM | POA: Insufficient documentation

## 2022-08-17 DIAGNOSIS — Z8659 Personal history of other mental and behavioral disorders: Secondary | ICD-10-CM | POA: Insufficient documentation

## 2022-08-17 DIAGNOSIS — F321 Major depressive disorder, single episode, moderate: Secondary | ICD-10-CM

## 2022-08-17 DIAGNOSIS — O4690 Antepartum hemorrhage, unspecified, unspecified trimester: Secondary | ICD-10-CM | POA: Insufficient documentation

## 2022-08-17 DIAGNOSIS — F509 Eating disorder, unspecified: Secondary | ICD-10-CM

## 2022-08-17 DIAGNOSIS — O99211 Obesity complicating pregnancy, first trimester: Secondary | ICD-10-CM

## 2022-08-17 DIAGNOSIS — F1290 Cannabis use, unspecified, uncomplicated: Secondary | ICD-10-CM | POA: Insufficient documentation

## 2022-08-17 DIAGNOSIS — O4691 Antepartum hemorrhage, unspecified, first trimester: Secondary | ICD-10-CM

## 2022-08-17 DIAGNOSIS — Z3481 Encounter for supervision of other normal pregnancy, first trimester: Secondary | ICD-10-CM

## 2022-08-17 DIAGNOSIS — O99330 Smoking (tobacco) complicating pregnancy, unspecified trimester: Secondary | ICD-10-CM | POA: Insufficient documentation

## 2022-08-17 DIAGNOSIS — Z3480 Encounter for supervision of other normal pregnancy, unspecified trimester: Secondary | ICD-10-CM

## 2022-08-17 DIAGNOSIS — O99210 Obesity complicating pregnancy, unspecified trimester: Secondary | ICD-10-CM | POA: Insufficient documentation

## 2022-08-17 HISTORY — DX: Personal history of other mental and behavioral disorders: Z86.59

## 2022-08-17 HISTORY — DX: Eating disorder, unspecified: F50.9

## 2022-08-17 HISTORY — DX: Obesity complicating pregnancy, unspecified trimester: O99.210

## 2022-08-17 LAB — WET PREP FOR TRICH, YEAST, CLUE
Trichomonas Exam: NEGATIVE
Yeast Exam: NEGATIVE

## 2022-08-17 LAB — HEMOGLOBIN, FINGERSTICK: Hemoglobin: 13.1 g/dL (ref 11.1–15.9)

## 2022-08-17 MED ORDER — CYCLOBENZAPRINE HCL 5 MG PO TABS: 5.0000 mg | ORAL_TABLET | Freq: Four times a day (QID) | ORAL | 0 refills | Status: DC | PRN

## 2022-08-17 MED ORDER — ASPIRIN 81 MG PO TBEC: 81.0000 mg | DELAYED_RELEASE_TABLET | Freq: Every day | ORAL | 8 refills | Status: DC

## 2022-08-17 MED ORDER — VITAMIN B-2 100 MG PO TABS: 100.0000 mg | ORAL_TABLET | Freq: Every day | ORAL | 3 refills | Status: DC

## 2022-08-17 NOTE — Progress Notes (Signed)
Hgb and wet mount reviewed. Per standing orders no treatment indicated. Tawny Hopping, RN

## 2022-08-17 NOTE — Progress Notes (Signed)
Here today for 10.5 week MH IP appt. Taking PNV QD. Had [facility] ED visit 07/23/2022 for vaginal bleeding. Declines Flu vaccine. 1hgtt today. Tawny Hopping, RN

## 2022-08-17 NOTE — Progress Notes (Signed)
[facility] Department  Maternal Health Clinic  INITIAL PRENATAL VISIT NOTE  Subjective:  Suzanne Nelson is a 27 y.o. [phone] at [redacted]w[redacted]d being seen today to start prenatal care at the [facility] Department.  She is currently monitored for the following issues for this high-risk pregnancy and has Major depressive disorder, single episode, moderate (HCC); Attention deficit hyperactivity disorder, combined type; Oppositional defiant disorder; Hydronephrosis; Calculus of distal left ureter; Left ureteral stone; History of spontaneous abortion x 2 in the last year; Subchorionic hemorrhage in first trimester; Supervision of other normal pregnancy, antepartum; Obesity during pregnancy, antepartum; Vaginal bleeding during pregnancy; Smoking (tobacco) complicating pregnancy, unspecified trimester; History of eating disorder; Migraine without aura, with intractable migraine, so stated, with status migrainosus; and Marijuana use on their problem list.  Patient reports nausea.  Contractions: Not present. Vag. Bleeding: None.  Movement: Absent. Denies leaking of fluid.   Indications for ASA therapy (per uptodate) One of the following: Previous pregnancy with preeclampsia, especially early onset and with an adverse outcome No Multifetal gestation No Chronic hypertension No Type 1 or 2 diabetes mellitus No Chronic kidney disease No Autoimmune disease (antiphospholipid syndrome, systemic lupus erythematosus) No  Two or more of the following: Nulliparity No Obesity (body mass index >30 kg/m2) Yes Family history of preeclampsia in mother or sister No Age ?35 years No Sociodemographic characteristics (African American race, low socioeconomic level) Yes Personal risk factors (eg, previous pregnancy with low birth weight or small for gestational age infant, previous adverse pregnancy outcome [eg, stillbirth], interval >10 years between pregnancies) No   The following portions of the  patient's history were reviewed and updated as appropriate: allergies, current medications, past family history, past medical history, past social history, past surgical history and problem list. Problem list updated.  Objective:   Vitals:   08/17/22 1311  BP: 116/64  Pulse: 76  Temp: (!) 97 F (36.1 C)  Weight: 174 lb 12.8 oz (79.3 kg)   Fetal Status:     Movement: Absent    Unable to hear FHR today due to technical difficulties with obesity  Physical Exam Vitals and nursing note reviewed.  Constitutional:      General: She is not in acute distress.    Appearance: Normal appearance. She is well-developed.  HENT:     Head: Normocephalic and atraumatic.     Right Ear: External ear normal.     Left Ear: External ear normal.     Nose: Nose normal. No congestion or rhinorrhea.     Mouth/Throat:     Lips: Pink.     Mouth: Mucous membranes are moist.     Dentition: Abnormal dentition. Dental caries present.     Pharynx: Oropharynx is clear. Uvula midline.     Comments: Dentition: poor Eyes:     General: No scleral icterus.    Conjunctiva/sclera: Conjunctivae normal.  Neck:     Thyroid: No thyroid mass or thyromegaly.  Cardiovascular:     Rate and Rhythm: Normal rate.     Pulses: Normal pulses.     Comments: Extremities are warm and well perfused Pulmonary:     Effort: Pulmonary effort is normal.     Breath sounds: Normal breath sounds.  Chest:     Chest wall: No mass.  Breasts:    Tanner Score is 5.     Breasts are symmetrical.     Right: Normal. No mass, nipple discharge or skin change.     Left: Normal. No mass,  nipple discharge or skin change.  Abdominal:     General: Abdomen is flat.     Palpations: Abdomen is soft.     Tenderness: There is no abdominal tenderness.     Comments: Gravid   Genitourinary:    General: Normal vulva.     Exam position: Lithotomy position.     Pubic Area: No rash.      Tanner stage (genital): 5.     Labia:        Right: No rash.         Left: No rash.      Vagina: Normal. No vaginal discharge.     Cervix: No cervical motion tenderness or friability.     Uterus: Normal. Not enlarged and not tender.      Adnexa: Right adnexa normal and left adnexa normal.     Rectum: Normal. No external hemorrhoid.     Comments: Unable to palpate uterus- technical difficulty due patient's body habitus Musculoskeletal:     Right lower leg: No edema.     Left lower leg: No edema.  Lymphadenopathy:     Cervical: No cervical adenopathy.     Upper Body:     Right upper body: No axillary adenopathy.     Left upper body: No axillary adenopathy.  Skin:    General: Skin is warm.     Capillary Refill: Capillary refill takes less than 2 seconds.  Neurological:     Mental Status: She is alert.     Assessment and Plan:  Pregnancy: [id number] at [redacted]w[redacted]d 27 year old female in clinic today for prenatal care. Patient excited about the pregnancy. Lives with boyfriend and daughter.  Is working at a gas station.  Patient states last LMP 06/03/22, dating US done on 07/09/22 in ED.   1. Supervision of other normal pregnancy, antepartum -Patient states she is taking PNV daily. -Patient reports some nausea.  Patient encouraged to eat meals high in protein every 2 hours for nausea and also given resource sheet. Will continue to monitor.   -PAP= not due until 2026 -Patient received dental care 6 months ago. Poor dentition.  Patient given dental information.   -Agrees to Maternti21 -Encouraged Flu and COVID vaccines today- declined.  -pt had [place] on 07/09/22 in ED- and another [place] on 07/29/22 with viable pregnancy -unable to hear FHR today, however pt is obese and < 11 weeks -in the absence of vaginal bleeding- will repeat US at 18-20 weeks for anatomy  - WET PREP FOR TRICH, YEAST, CLUE - [id number] 7+Oxycodone-Bund - Lead, blood (adult age 51 yrs or greater) - Pregnancy, Initial Screen - Glucose, 1 hour - Comprehensive metabolic panel - Hgb A1c w/o eAG -  Varicella zoster antibody, IgG - Protein / creatinine ratio, urine - TSH - MaterniT 21 plus Core, Blood - Hemoglobin, fingerstick  2. Obesity during pregnancy, antepartum -ASA recommendation discussed accepts and agrees to buy OTC and start taking at 08/26/22 when she is 12 weeks  -total weight gain of 11-20 discussed today  - aspirin EC 81 MG tablet; Take 1 tablet (81 mg total) by mouth daily. Swallow whole. START taking 08/26/2022  Dispense: 30 tablet; Refill: 8  3. Smoking (tobacco) complicating pregnancy, unspecified trimester -smoking cessation information provided -currently smoking 1/2 ppd -states she sometimes uses a vape to help her stop using cigarettes but she feels "very stressed right now"  -declined Rx for nicotine patches- states this makes her nauseous   4. Major depressive disorder, single  episode, moderate (HCC) -PHQ-9= 7, will continue to monitor. Declined referral to counseling- states she has a Veterinary surgeon and psychiatrist  -declined prescription today for Zoloft  5. Migraine without aura, with intractable migraine, so stated, with status migrainosus -previously on effexor- which was being Rx by Psychiatrist for her depression and helping with migraines -she stopped this "cold [place]" about 2 months ago, when she found out she was pregnant -consult with supervising MD- and Rx sent for Flexeril PRN with migraines and Vit B2 for prevention  - cyclobenzaprine (FLEXERIL) 5 MG tablet; Take 1 tablet (5 mg total) by mouth every 6 (six) hours as needed (Migraines).  Dispense: 20 tablet; Refill: 0 - riboflavin (VITAMIN B-2) 100 MG TABS tablet; Take 1 tablet (100 mg total) by mouth daily.  Dispense: 30 tablet; Refill: 3  6. Marijuana use -Denies alcohol use.  Endorses using CBD gummies with MJ, but denies smoking MJ- states she knows the UDS will be positive. UDS performed today.  -plan of safe care given -discussed that I do not recommend continued use of CBD or MJ containing  products as we do not know how safe this is -pt states she will "stop using at 5 months so it is out of the babies system before delivery"  7. Vaginal bleeding during pregnancy -none since early April  Discussed overview of care and coordination with inpatient delivery practices including [facility],  [facility].   Preterm labor symptoms and general obstetric precautions including but not limited to vaginal bleeding, contractions, leaking of fluid and fetal movement were reviewed in detail with the patient.  Please refer to After Visit Summary for other counseling recommendations.   Return in about 4 weeks (around 09/14/2022) for Routine Prenatal Care.  Future Appointments  Date Time Provider Department Center  09/15/2022  1:20 PM [facility] PROVIDER [facility] None   Lenice Llamas, FNP

## 2022-08-21 LAB — CANNABINOID CONFIRMATION, UR
CANNABINOIDS: POSITIVE — AB
Carboxy THC GC/MS Conf: 661 ng/mL

## 2022-08-21 LAB — 789231 7+OXYCODONE-BUND
Amphetamines, Urine: NEGATIVE ng/mL
BENZODIAZ UR QL: NEGATIVE ng/mL
Barbiturate screen, urine: NEGATIVE ng/mL
Cocaine (Metab.): NEGATIVE ng/mL
OPIATE SCREEN URINE: NEGATIVE ng/mL
Oxycodone/Oxymorphone, Urine: NEGATIVE ng/mL
PCP Quant, Ur: NEGATIVE ng/mL

## 2022-08-22 LAB — COMPREHENSIVE METABOLIC PANEL
ALT: 13 IU/L (ref 0–32)
AST: 13 IU/L (ref 0–40)
Albumin/Globulin Ratio: 1.9 (ref 1.2–2.2)
Albumin: 4 g/dL (ref 4.0–5.0)
Alkaline Phosphatase: 81 IU/L (ref 44–121)
BUN/Creatinine Ratio: 7 — ABNORMAL LOW (ref 9–23)
BUN: 4 mg/dL — ABNORMAL LOW (ref 6–20)
Bilirubin Total: <0.2 mg/dL (ref 0.0–1.2)
CO2: 18 mmol/L — ABNORMAL LOW (ref 20–29)
Calcium: 9 mg/dL (ref 8.7–10.2)
Chloride: 103 mmol/L (ref 96–106)
Creatinine, Ser: 0.54 mg/dL — ABNORMAL LOW (ref 0.57–1.00)
Globulin, Total: 2.1 g/dL (ref 1.5–4.5)
Glucose: 89 mg/dL (ref 70–99)
Potassium: 3.8 mmol/L (ref 3.5–5.2)
Sodium: 138 mmol/L (ref 134–144)
Total Protein: 6.1 g/dL (ref 6.0–8.5)
eGFR: 129 mL/min/{1.73_m2} (ref >59)

## 2022-08-22 LAB — MATERNIT 21 PLUS CORE, BLOOD
Fetal Fraction: 7
Result (T21): NEGATIVE
Trisomy 13 (Patau syndrome): NEGATIVE
Trisomy 18 (Edwards syndrome): NEGATIVE
Trisomy 21 (Down syndrome): NEGATIVE

## 2022-08-22 LAB — PREGNANCY, INITIAL SCREEN
Antibody Screen: NEGATIVE
Basophils Absolute: 0 10*3/uL (ref 0.0–0.2)
Basos: 0 %
Bilirubin, UA: NEGATIVE
Chlamydia trachomatis, NAA: NEGATIVE
EOS (ABSOLUTE): 0.2 10*3/uL (ref 0.0–0.4)
Eos: 2 %
Glucose, UA: NEGATIVE
HCV Ab: NONREACTIVE
HIV Screen 4th Generation wRfx: NONREACTIVE
Hematocrit: 39.7 % (ref 34.0–46.6)
Hemoglobin: 13.3 g/dL (ref 11.1–15.9)
Hepatitis B Surface Ag: NEGATIVE
Immature Grans (Abs): 0 10*3/uL (ref 0.0–0.1)
Immature Granulocytes: 0 %
Ketones, UA: NEGATIVE
Leukocytes,UA: NEGATIVE
Lymphocytes Absolute: 3.2 10*3/uL — ABNORMAL HIGH (ref 0.7–3.1)
Lymphs: 29 %
MCH: 30.5 pg (ref 26.6–33.0)
MCHC: 33.5 g/dL (ref 31.5–35.7)
MCV: 91 fL (ref 79–97)
Monocytes Absolute: 0.7 10*3/uL (ref 0.1–0.9)
Monocytes: 7 %
Neisseria Gonorrhoeae by PCR: NEGATIVE
Neutrophils Absolute: 6.7 10*3/uL (ref 1.4–7.0)
Neutrophils: 62 %
Nitrite, UA: NEGATIVE
Platelets: 333 10*3/uL (ref 150–450)
Protein,UA: NEGATIVE
RBC, UA: NEGATIVE
RBC: 4.36 x10E6/uL (ref 3.77–5.28)
RDW: 13.1 % (ref 11.7–15.4)
RPR Ser Ql: NONREACTIVE
Rh Factor: POSITIVE
Rubella Antibodies, IGG: 2.03 index (ref >0.99)
Specific Gravity, UA: 1.021 (ref 1.005–1.030)
Urobilinogen, Ur: 0.2 mg/dL (ref 0.2–1.0)
WBC: 10.8 10*3/uL (ref 3.4–10.8)
pH, UA: 6 (ref 5.0–7.5)

## 2022-08-22 LAB — MICROSCOPIC EXAMINATION: Casts: NONE SEEN /lpf

## 2022-08-22 LAB — PROTEIN / CREATININE RATIO, URINE
Creatinine, Urine: 122.3 mg/dL
Protein, Ur: 10.9 mg/dL
Protein/Creat Ratio: 89 mg/g creat (ref 0–200)

## 2022-08-22 LAB — TSH: TSH: 0.922 u[IU]/mL (ref 0.450–4.500)

## 2022-08-22 LAB — HGB A1C W/O EAG: Hgb A1c MFr Bld: 5.3 % (ref 4.8–5.6)

## 2022-08-22 LAB — VARICELLA ZOSTER ANTIBODY, IGG: Varicella zoster IgG: 193 index (ref >165)

## 2022-08-22 LAB — GLUCOSE, 1 HOUR GESTATIONAL: Gestational Diabetes Screen: 102 mg/dL (ref 70–139)

## 2022-08-22 LAB — HCV INTERPRETATION

## 2022-08-22 LAB — URINE CULTURE, OB REFLEX

## 2022-08-22 LAB — LEAD, BLOOD (ADULT >= 16 YRS): Lead-Whole Blood: <1 ug/dL (ref 0.0–3.4)

## 2022-08-25 ENCOUNTER — Telehealth: Payer: Self-pay | Admitting: Family Medicine

## 2022-08-25 DIAGNOSIS — O99341 Other mental disorders complicating pregnancy, first trimester: Secondary | ICD-10-CM

## 2022-08-25 MED ORDER — SERTRALINE HCL 50 MG PO TABS: 50.0000 mg | ORAL_TABLET | Freq: Every day | ORAL | 0 refills | Status: DC

## 2022-08-25 NOTE — Telephone Encounter (Signed)
Patient states that she was told at her first visit that a prescription for her mental health could be called in if she felt like she needed it. Patient states that she has thought about it and she would like something called in. Patient does not have access to her phone today but can be reached on her mother's phone [phone]. Patient states that she uses the [facility] in [place].

## 2022-08-25 NOTE — Telephone Encounter (Signed)
As [facility] initial appt and client evaluated by Aliene Altes FNP, print out of phone note given to Aliene Altes FNP per request of E. Sciora CNM. Jossie Ng, RN

## 2022-08-25 NOTE — Telephone Encounter (Signed)
Per Aliene Altes FNP, she ordered medicine and notified client today via phone call. Jossie Ng, RN

## 2022-08-25 NOTE — Telephone Encounter (Signed)
Returned call to patient regarding medication for depression. Pt states they do not feel worse but that they were thinking about it more and they feel like it would be best to be on something to stabilize mood. Discussed taking 0.5 tablets (25mg ) of Zoloft once a day for the first week, then increasing to 50mg  daily until we see her next. At this point we can increase dosage if needed. Questions answered and patient verbalized understanding.  [facility] FNP-C

## 2022-08-25 NOTE — Telephone Encounter (Signed)
Phone note printed and placed on E. Sciora CNM desk in [facility] provider workroom for review. Jossie Ng, RN

## 2022-08-30 ENCOUNTER — Telehealth: Payer: Self-pay | Admitting: Family Medicine

## 2022-08-30 NOTE — Telephone Encounter (Signed)
Call to client who reports onset of left shoulder rash 08/28/2022 which has increased in amt over left shoulder area. Reports rash has spread to left chest and when questioned, reports 1 small red spot on chest. States rash is blistering up, but unsure if contain fluid. States can see some small white dots on top of clustered rash. Questions regarding Varicella titer and counseled by lab result, considered immune by titer to Varicella. Denies wearing of new clothes, soaps (new face wash started recently, but no rash on face), no perfume use, no new clothes detergent. States started baby aspirin 08/26/22 and Zoloft 08/27/22. Denies hikes, exposure to known poison oak / ivy and has not been in any wooded areas. Consult with Hazle Coca CNM regarding above. Per Ms. Sciora CNM, counsel to use Benadry and Hydrocortisone cream per package directions and notify clinic if no improvement in 24 hours. Client counseled Benadryl make cause drowsiness. Client verbalized understanding. Jossie Ng, RN

## 2022-08-30 NOTE — Telephone Encounter (Signed)
Please give me a call I have this rash on my left shoulder that is causing me pain its also down my back

## 2022-09-03 ENCOUNTER — Telehealth: Payer: Self-pay | Admitting: Family Medicine

## 2022-09-03 NOTE — Telephone Encounter (Signed)
Pt called previously complaining about a rash, and the nurse missed diagnosed her overt the phone. The mother called very upset, because yesterday pt had to go to the hospital and was positive for shingles. She wants somebody from the clinic to call her back ASAP.

## 2022-09-03 NOTE — Telephone Encounter (Signed)
Return call to client who reports was diagnosed with shingles last pm (09/02/22) by [facility] and started on an antibiotic. States received one dose of antibiotic at hospital and mother is currently out now to pick up prescription (Per [facility] note, medicine is Acyclovir). Client counseled that on 08/30/22 phone call, treatment of rash provided to her was after a consult with provider regarding her symptoms. Client reports [facility] told her to schedule 1 week appt with her OB for follow-up and scheduled for 09/10/22. Client states wants to be sure that clinic knows about shingles diagnosis. Explained [facility] and [facility] Everywhere and that noted in her chart (pink sticky). Aliene Altes FNP-C aware of above. Client desires to speak with provider. Counseled would provider [place] her number, but would most likely be ~ 1 hour prior to call. Jossie Ng, RN

## 2022-09-03 NOTE — Telephone Encounter (Signed)
Returned call to patient regarding shingles which was diagnosed last night in the ED. Patient states that they were given acyclovir which is the current recommendation for treating shingles. Patient states she is concerned with how her baby will be. Wants to know if it is safe to take acyclovir and whether she can continue with oral benadryl and the cortisone cream for itching. I counseled her that all these are ok and safe during pregnancy.  I called Dr. Alvester Morin and reviewed the case with her. We recommend MFM consult and [place] at the time of detailed anatomy scan.

## 2022-09-06 ENCOUNTER — Encounter: Payer: Self-pay | Admitting: Family Medicine

## 2022-09-06 DIAGNOSIS — B029 Zoster without complications: Secondary | ICD-10-CM | POA: Insufficient documentation

## 2022-09-06 NOTE — Telephone Encounter (Signed)
Called patient to review consult with Dr. Alvester Morin. Discussed recommendation to have MFM consult with anatomy scan between 18-20 weeks.  Reviewed patient hx- she states she had varicella as a child.  Reviewed ER plan of care- confirmed that NO labs were ordered, and a sample of the rash was NOT obtained. Patient endorses that the rash has improved, and the itching is gone.  Added to problem list. Patient had no questions at this time  [facility] FNP-C

## 2022-09-09 ENCOUNTER — Telehealth: Payer: Self-pay

## 2022-09-09 NOTE — Telephone Encounter (Signed)
TC to patient to ask her to come to the [facility] entrance tomorrow so we can put her in the negative pressure room due to diagnosis of shingles on 07/03/22, per G. Shoffner recommendation. Patient told to look for the "emergency" sign and that an RN will be looking for her to arrive around 3:00pm. Patient states understanding.Burt Knack, RN

## 2022-09-10 ENCOUNTER — Ambulatory Visit: Payer: Medicaid Other | Admitting: Advanced Practice Midwife

## 2022-09-10 ENCOUNTER — Ambulatory Visit: Payer: Medicaid Other

## 2022-09-10 VITALS — BP 118/75 | HR 99 | Temp 98.0°F | Wt 168.2 lb

## 2022-09-10 DIAGNOSIS — O99332 Smoking (tobacco) complicating pregnancy, second trimester: Secondary | ICD-10-CM

## 2022-09-10 DIAGNOSIS — O99330 Smoking (tobacco) complicating pregnancy, unspecified trimester: Secondary | ICD-10-CM

## 2022-09-10 DIAGNOSIS — O99212 Obesity complicating pregnancy, second trimester: Secondary | ICD-10-CM

## 2022-09-10 DIAGNOSIS — G43011 Migraine without aura, intractable, with status migrainosus: Secondary | ICD-10-CM

## 2022-09-10 DIAGNOSIS — F1290 Cannabis use, unspecified, uncomplicated: Secondary | ICD-10-CM

## 2022-09-10 DIAGNOSIS — O99210 Obesity complicating pregnancy, unspecified trimester: Secondary | ICD-10-CM

## 2022-09-10 DIAGNOSIS — Z3482 Encounter for supervision of other normal pregnancy, second trimester: Secondary | ICD-10-CM

## 2022-09-10 DIAGNOSIS — B029 Zoster without complications: Secondary | ICD-10-CM

## 2022-09-10 DIAGNOSIS — Z3480 Encounter for supervision of other normal pregnancy, unspecified trimester: Secondary | ICD-10-CM

## 2022-09-10 LAB — URINALYSIS
Bilirubin, UA: NEGATIVE
Nitrite, UA: NEGATIVE
RBC, UA: NEGATIVE
Specific Gravity, UA: 1.025 (ref 1.005–1.030)
Urobilinogen, Ur: 1 mg/dL (ref 0.2–1.0)
pH, UA: 6 (ref 5.0–7.5)

## 2022-09-10 NOTE — Progress Notes (Signed)
[facility] Department Maternal Health Clinic  PRENATAL VISIT NOTE  Subjective:  Suzanne Nelson is a 27 y.o. [phone] at [redacted]w[redacted]d being seen today for ongoing prenatal care.  She is currently monitored for the following issues for this high-risk pregnancy and has Major depressive disorder, single episode, moderate (HCC); Attention deficit hyperactivity disorder, combined type; Oppositional defiant disorder; Hydronephrosis; Calculus of distal left ureter; Left ureteral stone; History of spontaneous abortion x 2 in the last year; Subchorionic hemorrhage in first trimester; Supervision of other normal pregnancy, antepartum; Obesity during pregnancy, antepartum; Vaginal bleeding during pregnancy; Smoking (tobacco) complicating pregnancy, unspecified trimester; History of eating disorder; Migraine without aura, with intractable migraine, so stated, with status migrainosus; 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; and Shingles in pregnancy diagnosed 09/02/22 on their problem list.  Patient reports  f/u only for shingles dx'd [facility] ER 09/02/22, N&V .  Contractions: Not present. Vag. Bleeding: None.  Movement: Absent. Denies leaking of fluid/ROM.   The following portions of the patient's history were reviewed and updated as appropriate: allergies, current medications, past family history, past medical history, past social history, past surgical history and problem list. Problem list updated.  Objective:   Vitals:   09/10/22 1508  BP: 118/75  Pulse: 99  Temp: 98 F (36.7 C)  Weight: 168 lb 3.2 oz (76.3 kg)    Fetal Status: Fetal Heart Rate (bpm): 145 Fundal Height: 16 cm Movement: Absent     General:  Alert, oriented and cooperative. Patient is in no acute distress.  Skin: Skin is warm and dry. No rash noted.   Cardiovascular: Normal heart rate noted  Respiratory: Normal respiratory effort, no problems with respiration noted  Abdomen: Soft, gravid, appropriate for gestational age.  Pain/Pressure:  Absent     Pelvic: Cervical exam deferred        Extremities: Normal range of motion.  Edema: None  Mental Status: Normal mood and affect. Normal behavior. Normal judgment and thought content.   Assessment and Plan:  Pregnancy: [id number] at [redacted]w[redacted]d  1. Supervision of other normal pregnancy, antepartum Here with her mom Hasn't made dental apt yet but states last cleaning was 03/2022 1 hour glucola=102 on 08/17/22 NIPS neg 08/17/22 Pap neg 08/26/22 Not working Last vomited yesterday x2 and time before was day before 1x; states vomits 4x/wk U/a today=ketones tr, glucose 1+, protein tr 6 lb wt loss in last 4 wks; pt asking for antiemetic. Has only eaten [employer] french fries today; counseled on eating small high protein snack q 2 hours, which pt states she is doing Zofran 4 mg dissolvable tablet #10 rx given Has had 2 u/s so far: 07/09/22 at 5 1/7 with no fetal pole and 07/29/22 @8 .0 with EDC=03/10/23  - Urinalysis (Urine Dip) - [id number] Drug Screen - Varicella Zoster Abs, IgG/IgM - Varicella zoster antibody, IgG  2. 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 Agrees to UDS   3. Obesity during pregnancy, antepartum -1 lb 12.8 oz (-0.816 kg) Taking ASA 81 mg daily  4. Herpes zoster without complication Dx'd [facility] ER 09/02/22 with shingles and given acyclovir 800 mg 5x/day x 10 days; states was vomiting whole capsules so would take another and took last acyclovir this am (day 8). States rash on left shoulder, left upper back and neck is not itching or burning anymore but still present. MFM consult and anatomy u/s ordered for 18 wks Varicella immune 08/17/22=193 Varicella IgM and IgG ordered today  5. Migraine without aura, with intractable migraine, so stated,  with status migrainosus Taking vit B2 daily and Flexeril prn migraine. States has had 2 migraines since last apt and has used 3 Flexeril out of #20  6. Smoking (tobacco) complicating pregnancy, unspecified trimester Smoking 1/2 ppd; counseled via 5 A's  to stop States vaping makes her vomit   Preterm labor symptoms and general obstetric precautions including but not limited to vaginal bleeding, contractions, leaking of fluid and fetal movement were reviewed in detail with the patient. Please refer to After Visit Summary for other counseling recommendations.  No follow-ups on file.  Future Appointments  Date Time Provider Department Center  09/15/2022  1:20 PM [facility] PROVIDER [facility] None    Alberteen Spindle, CNM

## 2022-09-10 NOTE — Progress Notes (Addendum)
Patient here to follow-up after 09/02/22 [facility] ED visit where she was diagnosed with shingles and given acyclovir. Patient states she has taken all of her acyclovir. Rash is left upper chest, left shoulder and back, some on left side of neck. Patient states initially pain was acute "like someone was touching her skin with a lit match". States no longer painful in that way and no longer itching. States has some muscle pain in left trapezius muscle and up left neck. Patient also states nausea/vomiting most day and would like some anti nausea medication... Marland KitchenBurt Knack, RN

## 2022-09-11 LAB — VARICELLA ZOSTER ANTIBODY, IGG: Varicella zoster IgG: >4000 index (ref >165)

## 2022-09-14 ENCOUNTER — Other Ambulatory Visit: Payer: Self-pay | Admitting: Advanced Practice Midwife

## 2022-09-14 DIAGNOSIS — Z363 Encounter for antenatal screening for malformations: Secondary | ICD-10-CM

## 2022-09-15 ENCOUNTER — Ambulatory Visit: Payer: Medicaid Other | Admitting: Advanced Practice Midwife

## 2022-09-15 VITALS — BP 119/71 | HR 92 | Temp 97.6°F | Wt 168.8 lb

## 2022-09-15 DIAGNOSIS — O99212 Obesity complicating pregnancy, second trimester: Secondary | ICD-10-CM

## 2022-09-15 DIAGNOSIS — Z3482 Encounter for supervision of other normal pregnancy, second trimester: Secondary | ICD-10-CM

## 2022-09-15 DIAGNOSIS — O99332 Smoking (tobacco) complicating pregnancy, second trimester: Secondary | ICD-10-CM

## 2022-09-15 DIAGNOSIS — N390 Urinary tract infection, site not specified: Secondary | ICD-10-CM | POA: Insufficient documentation

## 2022-09-15 DIAGNOSIS — B029 Zoster without complications: Secondary | ICD-10-CM

## 2022-09-15 DIAGNOSIS — O99330 Smoking (tobacco) complicating pregnancy, unspecified trimester: Secondary | ICD-10-CM

## 2022-09-15 DIAGNOSIS — O99210 Obesity complicating pregnancy, unspecified trimester: Secondary | ICD-10-CM

## 2022-09-15 DIAGNOSIS — G43011 Migraine without aura, intractable, with status migrainosus: Secondary | ICD-10-CM

## 2022-09-15 DIAGNOSIS — F1290 Cannabis use, unspecified, uncomplicated: Secondary | ICD-10-CM

## 2022-09-15 DIAGNOSIS — O2340 Unspecified infection of urinary tract in pregnancy, unspecified trimester: Secondary | ICD-10-CM | POA: Insufficient documentation

## 2022-09-15 DIAGNOSIS — Z3480 Encounter for supervision of other normal pregnancy, unspecified trimester: Secondary | ICD-10-CM

## 2022-09-15 DIAGNOSIS — O2342 Unspecified infection of urinary tract in pregnancy, second trimester: Secondary | ICD-10-CM

## 2022-09-15 LAB — 789231 7+OXYCODONE-BUND
Amphetamines, Urine: NEGATIVE ng/mL
BENZODIAZ UR QL: NEGATIVE ng/mL
Barbiturate screen, urine: NEGATIVE ng/mL
Cocaine (Metab.): NEGATIVE ng/mL
OPIATE SCREEN URINE: NEGATIVE ng/mL
Oxycodone/Oxymorphone, Urine: NEGATIVE ng/mL
PCP Quant, Ur: NEGATIVE ng/mL

## 2022-09-15 LAB — CANNABINOID CONFIRMATION, UR
CANNABINOIDS: POSITIVE — AB
Carboxy THC GC/MS Conf: 180 ng/mL

## 2022-09-15 LAB — VARICELLA ZOSTER ABS, IGG/IGM
Varicella IgM: 1.44 index — ABNORMAL HIGH (ref 0.00–0.90)
Varicella zoster IgG: >4000 index (ref >165)

## 2022-09-15 NOTE — Progress Notes (Signed)
Phone call to [facility] MFM and scheduled anatomy scan and [place] for 10/18/22 @ 9:45. Informed patient and mother of same. Tawny Hopping, RN

## 2022-09-15 NOTE — Progress Notes (Signed)
Here today for 14.6 week MH RV. Taking PNV and ASA QD. Has completed Acyclovir but went to [facility] ED 09/12/22 and was dx with UTI. Was given IV Rocephin and PO Keflex. Continues taking Keflex. Needs anatomy scan. Tawny Hopping, RN

## 2022-09-15 NOTE — Progress Notes (Signed)
[facility]  PRENATAL VISIT NOTE  Subjective:  Suzanne Nelson is a 27 y.o. [phone] at [redacted]w[redacted]d being seen today for ongoing prenatal care.  She is currently monitored for the following issues for this high-risk pregnancy and has Major depressive disorder, single episode, moderate (HCC); Attention deficit hyperactivity disorder, combined type; Oppositional defiant disorder; Hydronephrosis; Calculus of distal left ureter; Left ureteral stone; History of spontaneous abortion x 2 in the last year; Subchorionic hemorrhage in first trimester; Supervision of other normal pregnancy, antepartum; Obesity during pregnancy, antepartum; Vaginal bleeding during pregnancy; Smoking (tobacco) complicating pregnancy, unspecified trimester; History of eating disorder; Migraine without aura, with intractable migraine, so stated, with status migrainosus; 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; +UDS MJ 09/10/22; Shingles in pregnancy diagnosed 09/02/22 [facility] ER; and UTI (urinary tract infection) during pregnancy dx'd [facility] ER 09/12/22 on their problem list.  Patient reports vomiting.  Contractions: Not present. Vag. Bleeding: None.  Movement: Absent. Denies leaking of fluid/ROM.   The following portions of the patient's history were reviewed and updated as appropriate: allergies, current medications, past family history, past medical history, past social history, past surgical history and problem list. Problem list updated.  Objective:   Vitals:   09/15/22 1316  BP: 119/71  Pulse: 92  Temp: 97.6 F (36.4 C)  Weight: 168 lb 12.8 oz (76.6 kg)    Fetal Status: Fetal Heart Rate (bpm): 155 Fundal Height: 18 cm Movement: Absent     General:  Alert, oriented and cooperative. Patient is in no acute distress.  Skin: Skin is warm and dry. No rash noted.   Cardiovascular: Normal heart rate noted  Respiratory: Normal respiratory effort, no problems with respiration noted  Abdomen: Soft,  gravid, appropriate for gestational age.  Pain/Pressure: Absent     Pelvic: Cervical exam deferred        Extremities: Normal range of motion.  Edema: None  Mental Status: Normal mood and affect. Normal behavior. Normal judgment and thought content.   Assessment and Plan:  Pregnancy: G4P1021 at [redacted]w[redacted]d  1. Urinary tract infection in mother during pregnancy, antepartum Went to [facility] ER 09/12/22 with migraine and left flank pain and dx'd with UTI and given Rocephin and po Keflex 500 mg TID x 7 days which she is still taking C&S TOC at next apt  2. Supervision of other normal pregnancy, antepartum Last vomited yesterday x2 Last Zofran this am (has taken 2 since 09/10/22) Breakfast today: 2 eggs, water Lunch: nothing yet today Snack: 1 pack peanut butter crackers Dinner last night: beef stew, water, ice cream Not working Living with her mom and pt's 2 yo daughter (FOB living in his car and just got a job) Pt does not have a drivers license Please refer to R. Marlan Palau, LCSW at next apt  3. 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; +UDS MJ 09/10/22 States hasn't used since found out she was pregnant, however uses CBD gummies prn for insomnia or when stressed +UDS MJ 08/17/22 and 09/10/22 Counseled not to use MJ or CBD gummies in pregnancy  4. Obesity during pregnancy, antepartum -1 lb 3.2 oz (-0.544 kg) No weight gain or loss since 09/10/22 Taking ASA 81 mg daily  5. Herpes zoster without complication Resolving MFM u/s and consult 10/18/22 at 9:45 09/10/22 varicella IgG=>4,000 09/10/22 varicella IgM=1.44  6. Migraine without aura, with intractable migraine, so stated, with status migrainosus Taking vit B2 daily and Flexeril prn (took 2 since 09/10/22 for left shoulder/neck pain)  7.  Smoking (tobacco) complicating pregnancy, unspecified trimester Smoking 5-10 cpd   Preterm labor symptoms and general obstetric precautions including but not limited to vaginal bleeding, contractions, leaking of fluid and  fetal movement were reviewed in detail with the patient. Please refer to After Visit Summary for other counseling recommendations.  Return in about 4 weeks (around 10/13/2022) for routine PNC.  Future Appointments  Date Time Provider Department Center  10/13/2022  4:00 PM [facility] PROVIDER [facility] None  10/18/2022 10:00 AM [facility]    Alberteen Spindle, CNM

## 2022-09-21 NOTE — Addendum Note (Signed)
Addended by: Heywood Bene on: 09/21/2022 02:14 PM   Modules accepted: Orders

## 2022-09-25 ENCOUNTER — Other Ambulatory Visit: Payer: Self-pay | Admitting: Family Medicine

## 2022-09-25 DIAGNOSIS — O99341 Other mental disorders complicating pregnancy, first trimester: Secondary | ICD-10-CM

## 2022-10-13 ENCOUNTER — Ambulatory Visit: Payer: Medicaid Other | Admitting: Family Medicine

## 2022-10-13 VITALS — BP 109/65 | HR 86 | Temp 96.7°F | Wt 177.4 lb

## 2022-10-13 DIAGNOSIS — B029 Zoster without complications: Secondary | ICD-10-CM

## 2022-10-13 DIAGNOSIS — O99330 Smoking (tobacco) complicating pregnancy, unspecified trimester: Secondary | ICD-10-CM

## 2022-10-13 DIAGNOSIS — O99210 Obesity complicating pregnancy, unspecified trimester: Secondary | ICD-10-CM

## 2022-10-13 DIAGNOSIS — O99212 Obesity complicating pregnancy, second trimester: Secondary | ICD-10-CM

## 2022-10-13 DIAGNOSIS — Z3480 Encounter for supervision of other normal pregnancy, unspecified trimester: Secondary | ICD-10-CM

## 2022-10-13 DIAGNOSIS — O99332 Smoking (tobacco) complicating pregnancy, second trimester: Secondary | ICD-10-CM

## 2022-10-13 DIAGNOSIS — F321 Major depressive disorder, single episode, moderate: Secondary | ICD-10-CM

## 2022-10-13 NOTE — Progress Notes (Signed)
[facility]  PRENATAL VISIT NOTE  Subjective:  Suzanne VINCIGUERRA is a 27 y.o. [phone] at [redacted]w[redacted]d being seen today for ongoing prenatal care.  She is currently monitored for the following issues for this low-risk pregnancy and has Major depressive disorder, single episode, moderate (HCC); Attention deficit hyperactivity disorder, combined type; Oppositional defiant disorder; Hydronephrosis; Calculus of distal left ureter; Left ureteral stone; History of spontaneous abortion x 2 in the last year; Subchorionic hemorrhage in first trimester; Supervision of other normal pregnancy, antepartum; Obesity during pregnancy, antepartum; Vaginal bleeding during pregnancy; Smoking (tobacco) complicating pregnancy, unspecified trimester; History of eating disorder; Migraine without aura, with intractable migraine, so stated, with status migrainosus; 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  Patient reports no complaints.  Contractions: Not present. Vag. Bleeding: Other (leaking fluid - worried her membranes ruptures).  Movement: Present. Reports leaking of fluid/ROM.   The following portions of the patient's history were reviewed and updated as appropriate: allergies, current medications, past family history, past medical history, past social history, past surgical history and problem list. Problem list updated.  Objective:   Vitals:   10/13/22 1625  BP: 109/65  Pulse: 86  Temp: (!) 96.7 F (35.9 C)  Weight: 177 lb 6.4 oz (80.5 kg)    Fetal Status: Fetal Heart Rate (bpm): 147 Fundal Height: 18 cm Movement: Present     General:  Alert, oriented and cooperative. Patient is in no acute distress.  Skin: Skin is warm and dry. No rash noted.   Cardiovascular: Normal heart rate noted  Respiratory: Normal respiratory  effort, no problems with respiration noted  Abdomen: Soft, gravid, appropriate for gestational age.  Pain/Pressure: Absent     Pelvic: Cervical exam deferred        Extremities: Normal range of motion.  Edema: None  Mental Status: Normal mood and affect. Normal behavior. Normal judgment and thought content.   Assessment and Plan:  Pregnancy: G4P1021 at [redacted]w[redacted]d  1. Supervision of other normal pregnancy, antepartum -here today with mom -states she is worried that she is leaking amniotic fluid- or believes she is urinating on herself -Nitrazine negative, no pooling, ferning or evidence of ROM on vaginal exam -taking PNV daily 7 lb 6.4 oz (3.357 kg) -anatomy US on 10/18/22  - Urine Culture & Sensitivity - AFP, Serum, Open Spina Bifida  2. Herpes zoster without complication -has MFM consult with detailed anatomy scan 10/18/22  3. Smoking (tobacco) complicating pregnancy, unspecified trimester -smoking less than 1/2 pack per day -some days smoked 2-3 cig  -declines nicotine patches- states it causes vomiting   4. Obesity during pregnancy, antepartum -taking ASA daily -exercise- was walking but has now not doing it because mom collapsed -has been walking and moving more with house chores   5. Major depressive disorder, single episode, moderate (HCC) -on zoloft 50mg  daily- states she feels it is working well for her and doesn't want to increase it now -is moving and has a lot stress    Preterm labor symptoms and general obstetric precautions including but not limited to vaginal bleeding, contractions, leaking of fluid and fetal movement were reviewed in detail with the patient. Please refer to After Visit Summary for other counseling recommendations.  Return in about 4 weeks (around 11/10/2022) for Routine Prenatal Care.  Future Appointments  Date Time Provider Department Center  10/18/2022 10:00 AM [facility]    Lenice Llamas, [place]

## 2022-10-13 NOTE — Progress Notes (Signed)
Reports completing UTI antibiotics. TOC done today. AFP counseling and lab done today. Aware of [place] [facility] appointment on July 3rd.

## 2022-10-15 LAB — AFP, SERUM, OPEN SPINA BIFIDA
AFP MoM: 0.88
AFP Value: 38.2 ng/mL
Gest. Age on Collection Date: 18.6 weeks
Maternal Age At EDD: 28.7 yr
OSBR Risk 1 IN: 10000
Test Results:: NEGATIVE
Weight: 177 [lb_av]

## 2022-10-15 LAB — URINE CULTURE

## 2022-10-18 ENCOUNTER — Other Ambulatory Visit: Payer: Self-pay

## 2022-10-18 ENCOUNTER — Ambulatory Visit (HOSPITAL_BASED_OUTPATIENT_CLINIC_OR_DEPARTMENT_OTHER): Payer: Medicaid Other | Admitting: Obstetrics

## 2022-10-18 ENCOUNTER — Ambulatory Visit: Payer: Medicaid Other | Attending: Obstetrics

## 2022-10-18 ENCOUNTER — Encounter: Payer: Self-pay | Admitting: Advanced Practice Midwife

## 2022-10-18 VITALS — BP 117/70 | HR 83 | Temp 97.6°F | Ht 62.0 in | Wt 178.0 lb

## 2022-10-18 DIAGNOSIS — O358XX0 Maternal care for other (suspected) fetal abnormality and damage, not applicable or unspecified: Secondary | ICD-10-CM

## 2022-10-18 DIAGNOSIS — O98512 Other viral diseases complicating pregnancy, second trimester: Secondary | ICD-10-CM

## 2022-10-18 DIAGNOSIS — O99342 Other mental disorders complicating pregnancy, second trimester: Secondary | ICD-10-CM | POA: Diagnosis not present

## 2022-10-18 DIAGNOSIS — O99332 Smoking (tobacco) complicating pregnancy, second trimester: Secondary | ICD-10-CM | POA: Diagnosis not present

## 2022-10-18 DIAGNOSIS — Z3A19 19 weeks gestation of pregnancy: Secondary | ICD-10-CM | POA: Diagnosis not present

## 2022-10-18 DIAGNOSIS — Z363 Encounter for antenatal screening for malformations: Secondary | ICD-10-CM | POA: Insufficient documentation

## 2022-10-18 DIAGNOSIS — O208 Other hemorrhage in early pregnancy: Secondary | ICD-10-CM

## 2022-10-18 DIAGNOSIS — Z3480 Encounter for supervision of other normal pregnancy, unspecified trimester: Secondary | ICD-10-CM

## 2022-10-18 DIAGNOSIS — F1290 Cannabis use, unspecified, uncomplicated: Secondary | ICD-10-CM

## 2022-10-18 DIAGNOSIS — F17210 Nicotine dependence, cigarettes, uncomplicated: Secondary | ICD-10-CM | POA: Diagnosis not present

## 2022-10-18 DIAGNOSIS — O99210 Obesity complicating pregnancy, unspecified trimester: Secondary | ICD-10-CM

## 2022-10-18 DIAGNOSIS — B029 Zoster without complications: Secondary | ICD-10-CM | POA: Diagnosis not present

## 2022-10-18 DIAGNOSIS — O99322 Drug use complicating pregnancy, second trimester: Secondary | ICD-10-CM

## 2022-10-18 DIAGNOSIS — F32A Depression, unspecified: Secondary | ICD-10-CM

## 2022-10-18 NOTE — Progress Notes (Signed)
MFM Note  Suzanne Nelson was seen for a detailed fetal anatomy scan as she was noted to have a presumed shingles/herpes zoster infection that was diagnosed when she was about [redacted] weeks pregnant (in mid May).  The patient was seen at the [facility] ER due to a painful, pruritic rash over her left shoulder that was spreading to her upper back and neck.  Due to the presumed shingles infection, she was treated with acyclovir for about 10 days.  The patient reports that the painful rash has resolved.  She also has a history of depression that is treated with Zoloft.   She had a cell free DNA test earlier in her pregnancy which indicated a low risk for trisomy 46, 66, and 13. A female fetus is predicted.   She was informed that the fetal growth and amniotic fluid level were appropriate for her gestational age.   There were no obvious fetal anomalies noted on today's ultrasound exam.  However, today's exam was limited due to the fetal position.  The patient was informed that anomalies may be missed due to technical limitations. If the fetus is in a suboptimal position or maternal habitus is increased, visualization of the fetus in the maternal uterus may be impaired.  The following were discussed during today's consultation:  Herpes zoster/shingles infection in pregnancy  Herpes zoster/shingles is caused by the same virus (varicella-zoster virus) as chickenpox.  As shingles occurs in a patient who already has antibodies against the varicella-zoster virus, shingles should not pose a risk to the fetus or neonate.  Her varicella zoster IgG was elevated when the shingles was diagnosed, indicating that she had a prior exposure to varicella-zoster.  The patient was reassured by today's ultrasound findings of a normal fetus.  Depression in pregnancy  She should continue taking Zoloft or any other antidepressive medication that is prescribed.  A follow-up exam was scheduled in 6 weeks to assess the fetal growth and  to reassess the views of the fetal anatomy.    The patient stated that all of her questions were answered today.  A total of 30 minutes was spent counseling and coordinating the care for this patient.  Greater than 50% of the time was spent in direct face-to-face contact.

## 2022-11-08 ENCOUNTER — Ambulatory Visit: Payer: Medicaid Other | Admitting: Advanced Practice Midwife

## 2022-11-08 VITALS — BP 117/70 | HR 85 | Temp 97.6°F | Wt 182.6 lb

## 2022-11-08 DIAGNOSIS — F1290 Cannabis use, unspecified, uncomplicated: Secondary | ICD-10-CM

## 2022-11-08 DIAGNOSIS — F321 Major depressive disorder, single episode, moderate: Secondary | ICD-10-CM

## 2022-11-08 DIAGNOSIS — Z3482 Encounter for supervision of other normal pregnancy, second trimester: Secondary | ICD-10-CM

## 2022-11-08 DIAGNOSIS — O99212 Obesity complicating pregnancy, second trimester: Secondary | ICD-10-CM

## 2022-11-08 DIAGNOSIS — O99210 Obesity complicating pregnancy, unspecified trimester: Secondary | ICD-10-CM

## 2022-11-08 DIAGNOSIS — O99330 Smoking (tobacco) complicating pregnancy, unspecified trimester: Secondary | ICD-10-CM

## 2022-11-08 DIAGNOSIS — Z3480 Encounter for supervision of other normal pregnancy, unspecified trimester: Secondary | ICD-10-CM

## 2022-11-08 DIAGNOSIS — O99332 Smoking (tobacco) complicating pregnancy, second trimester: Secondary | ICD-10-CM

## 2022-11-08 NOTE — Progress Notes (Signed)
Here today for 22.4 week MH RV. Taking PNV and ASA every day. Denies ED/hospital visits since last RV. Aware of [place] [facility] follow up US appt 11/29/22 @ 2:00. Tawny Hopping, RN

## 2022-11-08 NOTE — Progress Notes (Addendum)
[facility]  PRENATAL VISIT NOTE  Subjective:  Suzanne Nelson is a 27 y.o. [phone] at [redacted]w[redacted]d being seen today for ongoing prenatal care.  She is currently monitored for the following issues for this high-risk pregnancy and has Major depressive disorder, single episode, moderate (HCC); Attention deficit hyperactivity disorder, combined type; Oppositional defiant disorder; Hydronephrosis; Calculus of distal left ureter; Left ureteral stone; History of spontaneous abortion x 2 in the last year; Subchorionic hemorrhage in first trimester; Supervision of other normal pregnancy, antepartum; Obesity during pregnancy, antepartum; Vaginal bleeding during pregnancy; Smoking (tobacco) complicating pregnancy, unspecified trimester; History of eating disorder; Migraine without aura, with intractable migraine, so stated, with status migrainosus; 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  Patient reports  chronic back pain when lies down .  Contractions: Not present. Vag. Bleeding: None.  Movement: Present. Denies leaking of fluid/ROM.   The following portions of the patient's history were reviewed and updated as appropriate: allergies, current medications, past family history, past medical history, past social history, past surgical history and problem list. Problem list updated.  Objective:   Vitals:   11/08/22 1038  BP: 117/70  Pulse: 85  Temp: 97.6 F (36.4 C)  Weight: 182 lb 9.6 oz (82.8 kg)    Fetal Status: Fetal Heart Rate (bpm): 133 Fundal Height: 22 cm Movement: Present     General:  Alert, oriented and cooperative. Patient is in no acute distress.  Skin: Skin is warm and dry. No rash noted.   Cardiovascular: Normal heart rate noted  Respiratory: Normal respiratory effort, no problems with respiration  noted  Abdomen: Soft, gravid, appropriate for gestational age.  Pain/Pressure: Absent     Pelvic: Cervical exam deferred        Extremities: Normal range of motion.     Mental Status: Normal mood and affect. Normal behavior. Normal judgment and thought content.   Assessment and Plan:  Pregnancy: G4P1021 at [redacted]w[redacted]d  1. Supervision of other normal pregnancy, antepartum Not working Living with her mom who works 2x/wk, FOB, and 2 yo daughter FOB just got a FT job today and they are trying to get out on their own Reviewed 10/18/22 u/s at 19 4/7 with anterior placenta, EFW=52%, AFI wnl, 3VC, poor visualization of anatomy so f/u u/s scheduled 11/29/22 12 lb 9.6 oz (5.715 kg) C&S 10/13/22 neg Pt wants baby circumcised in hospital and "if Medicaid doesn't pay for it then I don't want it" Pt states chronic back pain that she needs to take CBD for in order to sleep; her rheumatologist wants an MRI but she refuses to have during pregnancy   2. Obesity during pregnancy, antepartum 12 lb 9.6 oz (5.715 kg) 5 lb wt gain in last 4 wks Pt states her exercise is "running after her all day"; encouraged walking outside2-3x/wk x20 min  3. 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; +UDS MJ 09/10/22 Using CBD gummies 10 mg for chronic back pain Counseled not to use; suggestions given for back pain To do UDS at next apt  4. Major depressive disorder, single episode, moderate (HCC) Taking Zoloft 50 mg daily and wants to stay at this Kathreen Cosier, LCSW recommended pt have apt with [facility] for dx confirmation--# given Pt states she has a Personal assistant Therapist, art) that moved to [place] that her insurance  pays for  and she likes her and has seen >2 years that she and her mom have apts with  5. Smoking (tobacco) complicating pregnancy, unspecified trimester Pt states # of cigs depends on "how bad she is that day" referring to 8 yo daughter Currently 2-5 cpd   Preterm labor symptoms and general  obstetric precautions including but not limited to vaginal bleeding, contractions, leaking of fluid and fetal movement were reviewed in detail with the patient. Please refer to After Visit Summary for other counseling recommendations.  Return in about 4 weeks (around 12/06/2022) for routine PNC.  Future Appointments  Date Time Provider Department Center  11/29/2022  2:00 PM [facility]  12/02/2022 10:00 AM [facility] PROVIDER [facility] None    Alberteen Spindle, CNM

## 2022-11-23 ENCOUNTER — Other Ambulatory Visit: Payer: Self-pay

## 2022-11-29 ENCOUNTER — Ambulatory Visit: Payer: Medicaid Other | Attending: Obstetrics and Gynecology

## 2022-11-29 ENCOUNTER — Other Ambulatory Visit: Payer: Self-pay

## 2022-11-29 VITALS — BP 114/66 | HR 91 | Temp 98.1°F | Resp 17 | Ht 62.0 in | Wt 191.0 lb

## 2022-11-29 DIAGNOSIS — O98512 Other viral diseases complicating pregnancy, second trimester: Secondary | ICD-10-CM | POA: Diagnosis not present

## 2022-11-29 DIAGNOSIS — O99332 Smoking (tobacco) complicating pregnancy, second trimester: Secondary | ICD-10-CM | POA: Insufficient documentation

## 2022-11-29 DIAGNOSIS — Z8619 Personal history of other infectious and parasitic diseases: Secondary | ICD-10-CM

## 2022-11-29 DIAGNOSIS — B029 Zoster without complications: Secondary | ICD-10-CM

## 2022-11-29 DIAGNOSIS — O99212 Obesity complicating pregnancy, second trimester: Secondary | ICD-10-CM | POA: Diagnosis not present

## 2022-11-29 DIAGNOSIS — O2622 Pregnancy care for patient with recurrent pregnancy loss, second trimester: Secondary | ICD-10-CM | POA: Insufficient documentation

## 2022-11-29 DIAGNOSIS — O99210 Obesity complicating pregnancy, unspecified trimester: Secondary | ICD-10-CM

## 2022-11-29 DIAGNOSIS — Z8759 Personal history of other complications of pregnancy, childbirth and the puerperium: Secondary | ICD-10-CM

## 2022-11-29 DIAGNOSIS — O99322 Drug use complicating pregnancy, second trimester: Secondary | ICD-10-CM | POA: Diagnosis not present

## 2022-11-29 DIAGNOSIS — Z3A25 25 weeks gestation of pregnancy: Secondary | ICD-10-CM | POA: Diagnosis not present

## 2022-11-29 DIAGNOSIS — Z8659 Personal history of other mental and behavioral disorders: Secondary | ICD-10-CM

## 2022-11-29 DIAGNOSIS — O208 Other hemorrhage in early pregnancy: Secondary | ICD-10-CM

## 2022-11-29 DIAGNOSIS — Z3480 Encounter for supervision of other normal pregnancy, unspecified trimester: Secondary | ICD-10-CM

## 2022-11-29 DIAGNOSIS — O09292 Supervision of pregnancy with other poor reproductive or obstetric history, second trimester: Secondary | ICD-10-CM | POA: Insufficient documentation

## 2022-11-29 DIAGNOSIS — F17210 Nicotine dependence, cigarettes, uncomplicated: Secondary | ICD-10-CM

## 2022-11-29 DIAGNOSIS — F1290 Cannabis use, unspecified, uncomplicated: Secondary | ICD-10-CM

## 2022-12-02 ENCOUNTER — Ambulatory Visit: Payer: Medicaid Other | Admitting: Physician Assistant

## 2022-12-02 VITALS — BP 101/65 | HR 91 | Temp 97.3°F | Wt 191.0 lb

## 2022-12-02 DIAGNOSIS — O99210 Obesity complicating pregnancy, unspecified trimester: Secondary | ICD-10-CM

## 2022-12-02 DIAGNOSIS — F1290 Cannabis use, unspecified, uncomplicated: Secondary | ICD-10-CM

## 2022-12-02 DIAGNOSIS — Z3A26 26 weeks gestation of pregnancy: Secondary | ICD-10-CM

## 2022-12-02 DIAGNOSIS — O99212 Obesity complicating pregnancy, second trimester: Secondary | ICD-10-CM

## 2022-12-02 DIAGNOSIS — F321 Major depressive disorder, single episode, moderate: Secondary | ICD-10-CM

## 2022-12-02 DIAGNOSIS — Z3482 Encounter for supervision of other normal pregnancy, second trimester: Secondary | ICD-10-CM | POA: Diagnosis not present

## 2022-12-02 DIAGNOSIS — O99330 Smoking (tobacco) complicating pregnancy, unspecified trimester: Secondary | ICD-10-CM

## 2022-12-02 DIAGNOSIS — Z3480 Encounter for supervision of other normal pregnancy, unspecified trimester: Secondary | ICD-10-CM

## 2022-12-02 MED ORDER — SERTRALINE HCL 50 MG PO TABS
50.0000 mg | ORAL_TABLET | Freq: Every day | ORAL | 1 refills | Status: DC
Start: 2022-12-02 — End: 2023-09-03

## 2022-12-02 NOTE — Progress Notes (Signed)
Patient here for MH RV at 26 weeks. Patient states she couldn't make appointment with [facility] mood clinic or she would have to cancel her regular therapist appointments as Medicaid will not pay for both. Elected to keep provider she has been with for the past 5 years. S/S PTL reviewed with patient and given literature. CMHRP and PHQ9 today.Burt Knack, RN

## 2022-12-02 NOTE — Progress Notes (Signed)
[facility]  PRENATAL VISIT NOTE  Subjective:  Suzanne Nelson is a 27 y.o. [phone] at [redacted]w[redacted]d being seen today for ongoing prenatal care.  She is currently monitored for the following issues for this high-risk pregnancy and has Major depressive disorder, single episode, moderate (HCC); Attention deficit hyperactivity disorder, combined type; Oppositional defiant disorder; Hydronephrosis; Calculus of distal left ureter; Left ureteral stone; History of spontaneous abortion x 2 in the last year; Subchorionic hemorrhage in first trimester; Supervision of other normal pregnancy, antepartum; Obesity during pregnancy, antepartum; Vaginal bleeding during pregnancy; Smoking (tobacco) complicating pregnancy, unspecified trimester; History of eating disorder; Migraine without aura, with intractable migraine, so stated, with status migrainosus; 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  Patient reports backache.  Contractions: Not present. Vag. Bleeding: None.  Movement: Present. Backache sx are same as since onset at age 25. Denies leaking of fluid/ROM.   The following portions of the patient's history were reviewed and updated as appropriate: allergies, current medications, past family history, past medical history, past social history, past surgical history and problem list. Problem list updated.  Objective:   Vitals:   12/02/22 1018  BP: 101/65  Pulse: 91  Temp: (!) 97.3 F (36.3 C)  Weight: 191 lb (86.6 kg)    Fetal Status: Fetal Heart Rate (bpm): 140 Fundal Height: 27 cm Movement: Present     General:  Alert, oriented and cooperative. Patient is in no acute distress.  Skin: Skin is warm and dry. No rash noted.   Cardiovascular: Normal heart rate noted  Respiratory: Normal respiratory effort, no problems  with respiration noted  Abdomen: Soft, gravid, appropriate for gestational age.  Pain/Pressure: Absent     Pelvic: Cervical exam deferred        Extremities: Normal range of motion.  Edema: None  Mental Status: Normal mood and affect. Normal behavior. Normal judgment and thought content.   Assessment and Plan:  Pregnancy: G4P1021 at [redacted]w[redacted]d  1. Supervision of other normal pregnancy, antepartum Reviewed fetal anat [place].   2. [redacted] weeks gestation of pregnancy Anticipatory guidance re: routine 28-week labs at RV.  3. Obesity during pregnancy, antepartum Taking aspirin, to continue. TWG 21 lb, over desired. Pt states she has been to [facility] and discussed portion sizes/appropriate diet. Not interested in separate MNT appt.  4. Major depressive disorder, single episode, moderate (HCC) States she never got sertraline Rx as intended 08/25/22, but is doing well without it. Is amendable to medication therapy, and eRx sent to pharmacy of choice. States she contacted [facility], but not financially eligible to have care there if she continues therapy with her private provider, which she wants to do. PHQ-9 score - 3 today.  - sertraline (ZOLOFT) 50 MG tablet; Take 1 tablet (50 mg total) by mouth daily.  Dispense: 30 tablet; Refill: 1  5. 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; +UDS MJ 09/10/22 States last CBD gummy use 1 mo ago, agrees to UDS today. I endorsed ongoing abstinence of CBD. - Drug Screen, Urine  6. Smoking (tobacco) complicating pregnancy, unspecified trimester States last tobacco smoking 1 mo ago (cut from 1ppd to 1/2 ppd to 0 ppd) - I endorsed ongoing abstinence of tobacco.   Preterm labor symptoms and general obstetric precautions including but not limited to vaginal bleeding, contractions, leaking of fluid and  fetal movement were reviewed in detail with the patient. Please refer to After Visit Summary for other counseling recommendations.  Return in about 2 weeks (around  12/16/2022) for Routine prenatal care, 28 wk labs (before 10:30am or 3pm).  Future Appointments  Date Time Provider Department Center  12/16/2022 10:50 AM [facility] PROVIDER [facility] None    Landry Dyke, PA-C

## 2022-12-03 LAB — DRUG SCREEN, URINE
Amphetamines, Urine: NEGATIVE ng/mL
Barbiturate screen, urine: NEGATIVE ng/mL
Benzodiazepine Quant, Ur: NEGATIVE ng/mL
Cannabinoid Quant, Ur: NEGATIVE ng/mL
Cocaine (Metab.): NEGATIVE ng/mL
Opiate Quant, Ur: NEGATIVE ng/mL
PCP Quant, Ur: NEGATIVE ng/mL

## 2022-12-03 NOTE — Progress Notes (Signed)
Forwarded to Hazle Coca CNM

## 2022-12-16 ENCOUNTER — Ambulatory Visit: Payer: Medicaid Other | Admitting: Physician Assistant

## 2022-12-16 ENCOUNTER — Encounter: Payer: Self-pay | Admitting: Physician Assistant

## 2022-12-16 VITALS — BP 112/70 | HR 103 | Temp 98.4°F | Wt 198.0 lb

## 2022-12-16 DIAGNOSIS — Z3483 Encounter for supervision of other normal pregnancy, third trimester: Secondary | ICD-10-CM

## 2022-12-16 DIAGNOSIS — O99330 Smoking (tobacco) complicating pregnancy, unspecified trimester: Secondary | ICD-10-CM

## 2022-12-16 DIAGNOSIS — O99333 Smoking (tobacco) complicating pregnancy, third trimester: Secondary | ICD-10-CM

## 2022-12-16 DIAGNOSIS — Z23 Encounter for immunization: Secondary | ICD-10-CM

## 2022-12-16 DIAGNOSIS — F321 Major depressive disorder, single episode, moderate: Secondary | ICD-10-CM

## 2022-12-16 DIAGNOSIS — O99213 Obesity complicating pregnancy, third trimester: Secondary | ICD-10-CM

## 2022-12-16 DIAGNOSIS — Z3A28 28 weeks gestation of pregnancy: Secondary | ICD-10-CM

## 2022-12-16 DIAGNOSIS — O99210 Obesity complicating pregnancy, unspecified trimester: Secondary | ICD-10-CM

## 2022-12-16 DIAGNOSIS — F1290 Cannabis use, unspecified, uncomplicated: Secondary | ICD-10-CM

## 2022-12-16 DIAGNOSIS — Z3480 Encounter for supervision of other normal pregnancy, unspecified trimester: Secondary | ICD-10-CM

## 2022-12-16 LAB — HEMOGLOBIN, FINGERSTICK: Hemoglobin: 11.4 g/dL (ref 11.1–15.9)

## 2022-12-16 NOTE — Progress Notes (Addendum)
Here today for 28.0 week MH RV. Taking PNV, ASA and Zoloft every day. Denies ED/hospital visits since last RV. 28 week labs and Tdap today. Tawny Hopping, RN  Tdap given and tolerated well. Tawny Hopping, RN

## 2022-12-16 NOTE — Progress Notes (Signed)
[facility]  PRENATAL VISIT NOTE  Subjective:  Suzanne Nelson is a 27 y.o. [phone] at [redacted]w[redacted]d being seen today for ongoing prenatal care.  She is currently monitored for the following issues for this low-risk pregnancy and has Major depressive disorder, single episode, moderate (HCC); Attention deficit hyperactivity disorder, combined type; Oppositional defiant disorder; Hydronephrosis; Calculus of distal left ureter; Left ureteral stone; History of spontaneous abortion x 2 in the last year; Subchorionic hemorrhage in first trimester; Supervision of other normal pregnancy, antepartum; Obesity during pregnancy, antepartum; Vaginal bleeding during pregnancy; Smoking (tobacco) complicating pregnancy, unspecified trimester; History of eating disorder; Migraine without aura, with intractable migraine, so stated, with status migrainosus; 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  Patient reports  occ nose stinging/irritation .  Contractions: Not present. Vag. Bleeding: None.  Movement: Present. Denies leaking of fluid/ROM.   The following portions of the patient's history were reviewed and updated as appropriate: allergies, current medications, past family history, past medical history, past social history, past surgical history and problem list. Problem list updated.  Objective:   Vitals:   12/16/22 1104  BP: 112/70  Pulse: (!) 103  Temp: 98.4 F (36.9 C)  Weight: 198 lb (89.8 kg)    Fetal Status: Fetal Heart Rate (bpm): 150 Fundal Height: 29 cm Movement: Present     General:  Alert, oriented and cooperative. Patient is in no acute distress.  Skin: Skin is warm and dry. No rash noted.   Cardiovascular: Normal heart rate noted  Respiratory: Normal respiratory effort, no problems with respiration noted   Abdomen: Soft, gravid, appropriate for gestational age.  Pain/Pressure: Absent     Pelvic: Cervical exam deferred        Extremities: Normal range of motion.  Edema: None  Mental Status: Normal mood and affect. Normal behavior. Normal judgment and thought content.   Assessment and Plan:  Pregnancy: G4P1021 at [redacted]w[redacted]d  1. Supervision of other normal pregnancy, antepartum Recommend saline nose spray for nose sx and plan to reassess if still symptomatic at next visit. Routine 28-week labs today. - Glucose, 1 hour gestational - HIV Antibody (routine testing w rflx) - RPR - Hemoglobin, venipuncture  2. [redacted] weeks gestation of pregnancy Anticipatory guidance re: transition to visits every 2 weeks.  3. Obesity during pregnancy, antepartum TWG 28 lbs, over desired. Enc to increase walking.  4. 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; +UDS MJ 09/10/22 Last UDS neg, repeat UDS today with pt consent. - Drug Screen, Urine  5. Smoking (tobacco) complicating pregnancy, unspecified trimester Still not smoking.  6. Major depressive disorder, single episode, moderate (HCC) Taking Zoloft and doing well. States not seeing private mental health provider as she is not familiar with depression meds used in pregancy, but will resume care with her postpartum.   Preterm labor symptoms and general obstetric precautions including but not limited to vaginal bleeding, contractions, leaking of fluid and fetal movement were reviewed in detail with the patient. Please refer to After Visit Summary for other counseling recommendations.  Return in about 2 weeks (around 12/30/2022) for Routine prenatal care.  Future Appointments  Date Time Provider Department Center  12/30/2022  1:40 PM [facility] PROVIDER [facility] None    Landry Dyke, PA-C

## 2022-12-17 ENCOUNTER — Telehealth: Payer: Self-pay

## 2022-12-17 DIAGNOSIS — O99810 Abnormal glucose complicating pregnancy: Secondary | ICD-10-CM | POA: Insufficient documentation

## 2022-12-17 LAB — RPR: RPR Ser Ql: NONREACTIVE

## 2022-12-17 LAB — GLUCOSE, 1 HOUR GESTATIONAL: Gestational Diabetes Screen: 140 mg/dL — ABNORMAL HIGH (ref 70–139)

## 2022-12-17 LAB — HIV ANTIBODY (ROUTINE TESTING W REFLEX): HIV Screen 4th Generation wRfx: NONREACTIVE

## 2022-12-17 NOTE — Telephone Encounter (Signed)
Patient notified of elevated glucose and need to schedule 3 HR GTT. Patient states because of transportation issues will need to come in next Tuesday of Friday. Instructions given to not eat or drink after midnight Monday. BTHIELE RN

## 2022-12-18 LAB — DRUG SCREEN, URINE
Amphetamines, Urine: NEGATIVE ng/mL
Barbiturate screen, urine: NEGATIVE ng/mL
Benzodiazepine Quant, Ur: NEGATIVE ng/mL
Cannabinoid Quant, Ur: NEGATIVE ng/mL
Cocaine (Metab.): NEGATIVE ng/mL
Opiate Quant, Ur: NEGATIVE ng/mL
PCP Quant, Ur: NEGATIVE ng/mL

## 2022-12-21 ENCOUNTER — Ambulatory Visit: Payer: Medicaid Other | Admitting: Advanced Practice Midwife

## 2022-12-21 DIAGNOSIS — Z3480 Encounter for supervision of other normal pregnancy, unspecified trimester: Secondary | ICD-10-CM

## 2022-12-21 DIAGNOSIS — R7309 Other abnormal glucose: Secondary | ICD-10-CM

## 2022-12-21 DIAGNOSIS — Z3483 Encounter for supervision of other normal pregnancy, third trimester: Secondary | ICD-10-CM

## 2022-12-22 LAB — GESTATIONAL GLUCOSE TOLERANCE
Glucose, Fasting: 79 mg/dL (ref 70–94)
Glucose, GTT - 1 Hour: 138 mg/dL (ref 70–179)
Glucose, GTT - 2 Hour: 99 mg/dL (ref 70–154)
Glucose, GTT - 3 Hour: 64 mg/dL — ABNORMAL LOW (ref 70–139)

## 2022-12-30 ENCOUNTER — Telehealth: Payer: Self-pay

## 2022-12-30 ENCOUNTER — Ambulatory Visit: Payer: Medicaid Other

## 2022-12-30 NOTE — Telephone Encounter (Signed)
Call to client as informed by clerical staff that she had cancelled [facility] RV this pm on 12/30/22 due to no transportation. Per client, has not yet had a chance to work out transportation for next appt and states will schedule appt when transportation is worked out. Jossie Ng, RN

## 2023-01-05 ENCOUNTER — Telehealth: Payer: Self-pay | Admitting: Family Medicine

## 2023-01-05 NOTE — Telephone Encounter (Signed)
Patient missed last MAT Follow up due to transportation issues. She called to reschedule but I don't see anything till Sept 26th. She prefers a Monday so she can have transportation. Can someone give her a call and help schedule a follow up? Thank you!

## 2023-01-05 NOTE — Telephone Encounter (Signed)
Scheduled a Monday appointment. BTHIELE RN

## 2023-01-10 ENCOUNTER — Ambulatory Visit: Payer: Medicaid Other | Admitting: Advanced Practice Midwife

## 2023-01-10 VITALS — BP 102/62 | HR 97 | Temp 97.3°F | Wt 207.8 lb

## 2023-01-10 DIAGNOSIS — F321 Major depressive disorder, single episode, moderate: Secondary | ICD-10-CM

## 2023-01-10 DIAGNOSIS — O99210 Obesity complicating pregnancy, unspecified trimester: Secondary | ICD-10-CM

## 2023-01-10 DIAGNOSIS — O99810 Abnormal glucose complicating pregnancy: Secondary | ICD-10-CM

## 2023-01-10 DIAGNOSIS — Z3480 Encounter for supervision of other normal pregnancy, unspecified trimester: Secondary | ICD-10-CM

## 2023-01-10 DIAGNOSIS — Z3483 Encounter for supervision of other normal pregnancy, third trimester: Secondary | ICD-10-CM

## 2023-01-10 DIAGNOSIS — O99213 Obesity complicating pregnancy, third trimester: Secondary | ICD-10-CM

## 2023-01-10 DIAGNOSIS — O99330 Smoking (tobacco) complicating pregnancy, unspecified trimester: Secondary | ICD-10-CM

## 2023-01-10 NOTE — Progress Notes (Signed)
[facility] M[facility]  PRENATAL VISIT NOTE  Subjective:  Suzanne Nelson is a 27 y.o. [phone] at [redacted]w[redacted]d being seen today for ongoing prenatal care.  She is currently monitored for the following issues for this low-risk pregnancy and has Major depressive disorder, single episode, moderate (HCC); Attention deficit hyperactivity disorder, combined type; Oppositional defiant disorder; Hydronephrosis; Calculus of distal left ureter; Left ureteral stone; History of spontaneous abortion x 2 in the last year; Subchorionic hemorrhage in first trimester; Supervision of other normal pregnancy, antepartum; Obesity during pregnancy, antepartum; Smoking (tobacco) complicating pregnancy, unspecified trimester; History of eating disorder; Migraine without aura, with intractable migraine, so stated, with status migrainosus; 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  Patient reports backache.  Contractions: Not present. Vag. Bleeding: None.  Movement: Present. Denies leaking of fluid/ROM.   The following portions of the patient's history were reviewed and updated as appropriate: allergies, current medications, past family history, past medical history, past social history, past surgical history and problem list. Problem list updated.  Objective:   Vitals:   01/10/23 1308  BP: 102/62  Pulse: 97  Temp: (!) 97.3 F (36.3 C)  Weight: 207 lb 12.8 oz (94.3 kg)    Fetal Status: Fetal Heart Rate (bpm): 123 Fundal Height: 32 cm Movement: Present     General:  Alert, oriented and cooperative. Patient is in no acute distress.  Skin: Skin is warm and dry. No rash noted.   Cardiovascular: Normal heart rate noted  Respiratory: Normal respiratory effort, no problems  with respiration noted  Abdomen: Soft, gravid, appropriate for gestational age.  Pain/Pressure: Absent     Pelvic: Cervical exam deferred        Extremities: Normal range of motion.  Edema: None  Mental Status: Normal mood and affect. Normal behavior. Normal judgment and thought content.   Assessment and Plan:  Pregnancy: G4P1021 at [redacted]w[redacted]d  1. Supervision of other normal pregnancy, antepartum Here with her mom Wants ocp's pp Not working Not exercising; encouraged to do so 3-4x/wk x 20 min Has car seat and basinett C/o mid back pain this am; has old mattress and can't afford to buy a new one; suggestions given Reviewed 11/29/22 u/s at 25 1/7 with AFI wnl, EFW=31%, anterior placenta 37 lb 12.8 oz (17.1 kg) 9 lb wt gain in last 3 wks  2. Obesity during pregnancy, antepartum Taking ASA 81 mg daily  3. Smoking (tobacco) complicating pregnancy, unspecified trimester Denies smoking  4. Major depressive disorder, single episode, moderate (HCC) Pt states hasn't picked up Zoloft from pharmacy yet Pt states was told by [facility] that if she sees them, that insurance will not pay for her private counselor Jenifer Hambright whom she sees 2x/mo so pt refused to go to [facility]  5. Abnormal glucose tolerance test in pregnancy 12/16/22 1 hour glucola=140 3 hour GTT 12/21/22 wnl    Preterm labor symptoms and general obstetric precautions including but not limited to vaginal bleeding, contractions, leaking of fluid and fetal movement were reviewed in detail with the patient. Please refer to After Visit Summary for other counseling recommendations.  Return in about 2 weeks (around 01/24/2023) for routine PNC.  No future appointments.  Alberteen Spindle, CNM

## 2023-01-10 NOTE — Progress Notes (Signed)
Per client, plans to pick up Zoloft and start taking it. Delayed pick up from pharmacy due to pay day / transportation issues. Counseled on recommendation for flu vaccine in pregnancy. VIS given and desires to consider if will take vaccine Pink sticky noted to offer next RV Also given RSV VIS. Jossie Ng, RN

## 2023-01-19 ENCOUNTER — Telehealth: Payer: Self-pay

## 2023-01-19 NOTE — Telephone Encounter (Signed)
Call transferred to [facility] from [place] as needs [facility] RV next week, but schedule full. Due to child care and transportation issues, client states Monday appt would be best. Scheduled for 01/24/23 with arrival time of 1040. Client aware this is a work-in appt. Jossie Ng, RN

## 2023-01-21 ENCOUNTER — Encounter: Payer: Self-pay | Admitting: Obstetrics and Gynecology

## 2023-01-21 ENCOUNTER — Observation Stay
Admission: EM | Admit: 2023-01-21 | Discharge: 2023-01-22 | Disposition: A | Discharge status: Home / Self Care | Payer: Medicaid Other | Attending: Obstetrics and Gynecology | Admitting: Obstetrics and Gynecology

## 2023-01-21 ENCOUNTER — Other Ambulatory Visit: Payer: Self-pay

## 2023-01-21 DIAGNOSIS — Z3480 Encounter for supervision of other normal pregnancy, unspecified trimester: Secondary | ICD-10-CM

## 2023-01-21 DIAGNOSIS — F17200 Nicotine dependence, unspecified, uncomplicated: Secondary | ICD-10-CM | POA: Insufficient documentation

## 2023-01-21 DIAGNOSIS — B029 Zoster without complications: Secondary | ICD-10-CM

## 2023-01-21 DIAGNOSIS — O99333 Smoking (tobacco) complicating pregnancy, third trimester: Secondary | ICD-10-CM | POA: Insufficient documentation

## 2023-01-21 DIAGNOSIS — O99810 Abnormal glucose complicating pregnancy: Secondary | ICD-10-CM

## 2023-01-21 DIAGNOSIS — Z3A33 33 weeks gestation of pregnancy: Secondary | ICD-10-CM | POA: Insufficient documentation

## 2023-01-21 DIAGNOSIS — O99210 Obesity complicating pregnancy, unspecified trimester: Secondary | ICD-10-CM

## 2023-01-21 DIAGNOSIS — O26853 Spotting complicating pregnancy, third trimester: Principal | ICD-10-CM | POA: Insufficient documentation

## 2023-01-21 DIAGNOSIS — O208 Other hemorrhage in early pregnancy: Principal | ICD-10-CM

## 2023-01-21 HISTORY — DX: Pneumonia, unspecified organism: J18.9

## 2023-01-21 NOTE — OB Triage Note (Signed)
PATIENT REPORTS TO TRIAGE FOR RIGHT LOWER LEG SWELLING AND INTERMITTENT SHARP PAIN IN RIGHT ANKLE FOR THE PAST WEEK. PT STATED AFTER USING RESTROOM NOTED PINK MUCOUS LIKE BLOOD ON TISSUE. PT DENIES BLEEDING LIKE A PERIOD. PT REPORTS POSITIVE FETAL MOVEMENT. PT DENIES ANY LEAKING OF FLUID AT THIS TIME. PT DENIES ALL PIH SYMPTOMS. PT REPORTS ELEVATED BP OF 125/77 AT HOME SEVERAL HOURS AGO. PT ON MONITOR AND ASSESSED. PROVIDER TO BE NOTIFIED

## 2023-01-22 DIAGNOSIS — O99333 Smoking (tobacco) complicating pregnancy, third trimester: Secondary | ICD-10-CM | POA: Diagnosis not present

## 2023-01-22 DIAGNOSIS — Z3A33 33 weeks gestation of pregnancy: Secondary | ICD-10-CM | POA: Diagnosis not present

## 2023-01-22 DIAGNOSIS — O26853 Spotting complicating pregnancy, third trimester: Secondary | ICD-10-CM | POA: Diagnosis present

## 2023-01-22 DIAGNOSIS — F17200 Nicotine dependence, unspecified, uncomplicated: Secondary | ICD-10-CM | POA: Diagnosis not present

## 2023-01-22 HISTORY — DX: Spotting complicating pregnancy, third trimester: O26.853

## 2023-01-22 LAB — WET PREP, GENITAL
Clue Cells Wet Prep HPF POC: NONE SEEN — AB
Sperm: NONE SEEN
Trich, Wet Prep: NONE SEEN — AB
WBC, Wet Prep HPF POC: <10 (ref <10)
Yeast Wet Prep HPF POC: NONE SEEN — AB

## 2023-01-22 NOTE — Discharge Summary (Signed)
Patient ID: Suzanne Nelson MRN: [id number] DOB/AGE: Feb 05, 1996 27 y.o.  Admit date: 01/21/2023 Discharge date: 01/22/2023  Admission Diagnoses: 27yo G4P1 at [redacted]w[redacted]d presents with spotting.  Denies LOF or contractions. Reports good fetal movement. No recent IC.  Discharge Diagnoses: No vaginal bleeding noted  Factors complicating pregnancy: Patient Active Problem List   Diagnosis Date Noted   Spotting affecting pregnancy in third trimester 01/22/2023   Abnormal glucose tolerance test in pregnancy 12/16/22 1 hour glucola=140 12/17/2022   UTI (urinary tract infection) during pregnancy dx'd [facility] ER 09/12/22 09/15/2022   Shingles in pregnancy diagnosed 09/02/22 [facility] ER 09/06/2022   Supervision of other normal pregnancy, antepartum 08/17/2022   Obesity during pregnancy, antepartum 08/17/2022   Smoking (tobacco) complicating pregnancy, unspecified trimester 08/17/2022   History of eating disorder 08/17/2022   Migraine without aura, with intractable migraine, so stated, with status migrainosus 08/17/2022   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; +UDS MJ 09/10/22 08/17/2022   Subchorionic hemorrhage in first trimester 08/02/2022   History of spontaneous abortion x 2 in the last year 07/21/2022   Hydronephrosis 05/18/2015   Calculus of distal left ureter 05/18/2015   Left ureteral stone 05/18/2015   Major depressive disorder, single episode, moderate (HCC) 03/16/2011   Attention deficit hyperactivity disorder, combined type 03/16/2011   Oppositional defiant disorder 03/16/2011     Prenatal Procedures: NST  Consults: None  Significant Diagnostic Studies:  Results for orders placed or performed during the hospital encounter of 01/21/23 (from the past 168 hour(s))  Wet prep, genital   Collection Time: 01/22/23 12:45 AM   Specimen: Vaginal  Result Value Ref Range   Yeast Wet Prep HPF POC NONE SEEN (A) NONE SEEN   Trich, Wet Prep NONE SEEN (A) NONE SEEN   Clue Cells Wet Prep HPF POC NONE SEEN (A) NONE  SEEN   WBC, Wet Prep HPF POC <10 <10   Sperm NONE SEEN     Treatments: none  Hospital Course:  This is a 27 y.o. [id number] with IUP at [redacted]w[redacted]d seen for spotting.  Wet prep swab collected with no blood noted on the swab.  Wet prep was negative.  She was observed, fetal heart rate monitoring remained reassuring, and she had no signs/symptoms of preterm labor or other maternal-fetal concerns.  She was deemed stable for discharge to home with outpatient follow up.  Discharge Physical Exam:  BP 113/71 (BP Location: Left Arm)   Pulse 94   Temp 97.8 F (36.6 C) (Oral)   Resp 19   Ht 5\' 2"  (1.575 m)   Wt 87.1 kg   LMP 06/03/2022 (Exact Date)   BMI 35.12 kg/m    NST: FHR baseline: 130 bpm Variability: moderate Accelerations: yes Decelerations: none Category/reactivity: reactive   TOCO: quiet SVE: deferred      Discharge Condition: Stable  Disposition: Discharge disposition: 01-Home or Self Care        Allergies as of 01/22/2023       Reactions   Onion Swelling   Adhesive [tape] Other (See Comments)   Steri-Strips & Silk tape   Tape Rash   Reaction to Paper tape. Pt denies reaction to tegaderm or surgical tape.        Medication List     TAKE these medications    acetaminophen 325 MG tablet Commonly known as: Tylenol Take 2 tablets (650 mg total) by mouth every 4 (four) hours as needed (for pain scale < 4).   aspirin EC 81 MG tablet Take 1  tablet (81 mg total) by mouth daily. Swallow whole. START taking 08/26/2022   cyclobenzaprine 5 MG tablet Commonly known as: FLEXERIL Take 1 tablet (5 mg total) by mouth every 6 (six) hours as needed (Migraines).   multivitamin-prenatal 27-0.8 MG Tabs tablet Take 1 tablet by mouth daily at 12 noon.   ondansetron 4 MG disintegrating tablet Commonly known as: ZOFRAN-ODT Take 1 tablet (4 mg total) by mouth every 8 (eight) hours as needed for nausea or vomiting.   riboflavin 100 MG Tabs tablet Commonly known as:  VITAMIN B-2 Take 1 tablet (100 mg total) by mouth daily.   sertraline 50 MG tablet Commonly known as: ZOLOFT Take 1 tablet (50 mg total) by mouth daily.        Follow-up Information     [facility] DEPT. Schedule an appointment as soon as possible for a visit in 2 day(s).   Contact information: [address] [place] [address] [phone]                Signed:  Quillian Quince 01/22/2023 2:31 AM

## 2023-01-22 NOTE — OB Triage Note (Signed)
Discharge instructions given and reviewed with pt. Pre term bleeding reviewed with pt. Pre term precautions reviewed with pt. Pt ambulated off the unit at 0242 with mother.

## 2023-01-24 ENCOUNTER — Ambulatory Visit: Payer: Medicaid Other | Admitting: Advanced Practice Midwife

## 2023-01-24 VITALS — BP 105/66 | HR 101 | Temp 97.0°F | Wt 212.6 lb

## 2023-01-24 DIAGNOSIS — O99333 Smoking (tobacco) complicating pregnancy, third trimester: Secondary | ICD-10-CM | POA: Diagnosis not present

## 2023-01-24 DIAGNOSIS — O99810 Abnormal glucose complicating pregnancy: Secondary | ICD-10-CM

## 2023-01-24 DIAGNOSIS — Z3480 Encounter for supervision of other normal pregnancy, unspecified trimester: Secondary | ICD-10-CM

## 2023-01-24 DIAGNOSIS — F1290 Cannabis use, unspecified, uncomplicated: Secondary | ICD-10-CM

## 2023-01-24 DIAGNOSIS — F321 Major depressive disorder, single episode, moderate: Secondary | ICD-10-CM

## 2023-01-24 DIAGNOSIS — O99213 Obesity complicating pregnancy, third trimester: Secondary | ICD-10-CM

## 2023-01-24 DIAGNOSIS — O99210 Obesity complicating pregnancy, unspecified trimester: Secondary | ICD-10-CM

## 2023-01-24 DIAGNOSIS — O99330 Smoking (tobacco) complicating pregnancy, unspecified trimester: Secondary | ICD-10-CM

## 2023-01-24 DIAGNOSIS — Z3483 Encounter for supervision of other normal pregnancy, third trimester: Secondary | ICD-10-CM

## 2023-01-24 NOTE — Progress Notes (Signed)
[facility] Department M[facility] Health Clinic  PRENATAL VISIT NOTE  Subjective:  Suzanne Nelson is a 27 y.o. [phone] at [redacted]w[redacted]d being seen today for ongoing prenatal care.  She is currently monitored for the following issues for this high-risk pregnancy and has Major depressive disorder, single episode, moderate (HCC); Attention deficit hyperactivity disorder, combined type; Oppositional defiant disorder; Hydronephrosis; Calculus of distal left ureter; Left ureteral stone; History of spontaneous abortion x 2 in the last year; Subchorionic hemorrhage in first trimester; Supervision of other normal pregnancy, antepartum; Obesity during pregnancy, antepartum; Smoking (tobacco) complicating pregnancy, unspecified trimester; History of eating disorder; Migraine without aura, with intractable migraine, so stated, with status migrainosus; 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  Patient reports no complaints.  Contractions: Not present. Vag. Bleeding: None.  Movement: Present. Denies leaking of fluid/ROM.   The following portions of the patient's history were reviewed and updated as appropriate: allergies, current medications, past family history, past medical history, past social history, past surgical history and problem list. Problem list updated.  Objective:   Vitals:   01/24/23 1044  BP: 105/66  Pulse: (!) 101  Temp: (!) 97 F (36.1 C)  Weight: 212 lb 9.6 oz (96.4 kg)    Fetal Status: Fetal Heart Rate (bpm): 150 Fundal Height: 33 cm Movement: Present     General:  Alert, oriented and cooperative. Patient is in no acute distress.  Skin: Skin is warm and dry. No rash noted.   Cardiovascular: Normal heart rate noted   Respiratory: Normal respiratory effort, no problems with respiration noted  Abdomen: Soft, gravid, appropriate for gestational age.  Pain/Pressure: Absent     Pelvic: Cervical exam deferred        Extremities: Normal range of motion.  Edema: Mild pitting, slight indentation  Mental Status: Normal mood and affect. Normal behavior. Normal judgment and thought content.   Assessment and Plan:  Pregnancy: G4P1021 at [redacted]w[redacted]d  1. Supervision of other normal pregnancy, antepartum Not working Living with her mom and daughter States went to L&D 01/21/23 for edematous right leg; discussion on edema and ways to prevent Has car seat and [place]ett Walking 3-4x/wk x 15-30 min  2. Obesity during pregnancy, antepartum 42 lb 9.6 oz (19.3 kg) 5 lb wt gain in last 2 wks Taking ASA 81 mg daily  3. 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; +UDS MJ 09/10/22 Denies use  4. Major depressive disorder, single episode, moderate (HCC) Pt states she sees her private counselor Hardie Pulley, 2x/mo Began taking Zoloft 50 mg on 01/13/23 and feels no different and declines to go up to 100 mg daily -cry, sleep wnl, appetite wnl, energy level wnl, -anhedonia, -SI/HI, -moody, irritable  5. Abnormal glucose tolerance test in pregnancy 12/16/22 1 hour glucola=140 3 hour GTT wnl on 12/21/22  6. Smoking (tobacco) complicating pregnancy, unspecified trimester Denies use   Preterm labor symptoms and general obstetric precautions including but not limited to vaginal bleeding, contractions, leaking of fluid and fetal movement were reviewed in detail with the patient. Please refer to After Visit Summary for other counseling recommendations.  Return in about 2 weeks (around 02/07/2023) for routine PNC.  No future appointments.  Alberteen Spindle, CNM

## 2023-01-24 NOTE — Progress Notes (Signed)
Kick count cards with client. Jossie Ng, RN

## 2023-02-05 ENCOUNTER — Other Ambulatory Visit: Payer: Self-pay | Admitting: Family Medicine

## 2023-02-05 DIAGNOSIS — F321 Major depressive disorder, single episode, moderate: Secondary | ICD-10-CM

## 2023-02-07 ENCOUNTER — Ambulatory Visit: Payer: Medicaid Other | Admitting: Advanced Practice Midwife

## 2023-02-07 ENCOUNTER — Telehealth: Payer: Self-pay | Admitting: Family Medicine

## 2023-02-07 VITALS — BP 105/67 | HR 98 | Temp 97.6°F | Wt 218.6 lb

## 2023-02-07 DIAGNOSIS — O99210 Obesity complicating pregnancy, unspecified trimester: Secondary | ICD-10-CM

## 2023-02-07 DIAGNOSIS — Z3483 Encounter for supervision of other normal pregnancy, third trimester: Secondary | ICD-10-CM

## 2023-02-07 DIAGNOSIS — O99810 Abnormal glucose complicating pregnancy: Secondary | ICD-10-CM

## 2023-02-07 DIAGNOSIS — O99333 Smoking (tobacco) complicating pregnancy, third trimester: Secondary | ICD-10-CM

## 2023-02-07 DIAGNOSIS — O99213 Obesity complicating pregnancy, third trimester: Secondary | ICD-10-CM

## 2023-02-07 DIAGNOSIS — F1290 Cannabis use, unspecified, uncomplicated: Secondary | ICD-10-CM

## 2023-02-07 DIAGNOSIS — Z23 Encounter for immunization: Secondary | ICD-10-CM

## 2023-02-07 DIAGNOSIS — Z3480 Encounter for supervision of other normal pregnancy, unspecified trimester: Secondary | ICD-10-CM

## 2023-02-07 DIAGNOSIS — O99330 Smoking (tobacco) complicating pregnancy, unspecified trimester: Secondary | ICD-10-CM

## 2023-02-07 NOTE — Telephone Encounter (Signed)
Pt wanted to make a FU appt for 11/6th as stated on her visit encounter, per nurse instruction we disregarded since the patient has an appt set up for 10/28 already. I called the patient to let her know that, but she seemed very animated as to her doctor wanting her to make sure there was availability because is imperative that she is seen every week. I told her not to worry, if the provider estimates that an appointment for the following week is necessary, it can be arranged on Monday 10/28 even if overbooking is required. FYI

## 2023-02-07 NOTE — Progress Notes (Signed)
[facility] Department Maternal Health Clinic  PRENATAL VISIT NOTE  Subjective:  Suzanne Nelson is a 27 y.o. [phone] at [redacted]w[redacted]d being seen today for ongoing prenatal care.  She is currently monitored for the following issues for this high-risk pregnancy and has Major depressive disorder, single episode, moderate (HCC); Attention deficit hyperactivity disorder, combined type; Oppositional defiant disorder; Hydronephrosis; Calculus of distal left ureter; Left ureteral stone; History of spontaneous abortion x 2 in the last year; Subchorionic hemorrhage in first trimester; Supervision of other normal pregnancy, antepartum; Obesity during pregnancy, antepartum; Smoking (tobacco) complicating pregnancy, unspecified trimester; History of eating disorder; Migraine without aura, with intractable migraine, so stated, with status migrainosus; 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  Patient reports  itching on arms and legs .  Contractions: Not present. Vag. Bleeding: None.  Movement: Present. Denies leaking of fluid/ROM.   The following portions of the patient's history were reviewed and updated as appropriate: allergies, current medications, past family history, past medical history, past social history, past surgical history and problem list. Problem list updated.  Objective:   Vitals:   02/07/23 1109  BP: 105/67  Pulse: 98  Temp: 97.6 F (36.4 C)  Weight: 218 lb 9.6 oz (99.2 kg)    Fetal Status: Fetal Heart Rate (bpm): 145 Fundal Height: 35 cm Movement: Present     General:  Alert, oriented and cooperative. Patient is in no acute distress.  Skin: Skin is warm and dry. No rash noted.   Cardiovascular: Normal heart rate  noted  Respiratory: Normal respiratory effort, no problems with respiration noted  Abdomen: Soft, gravid, appropriate for gestational age.  Pain/Pressure: Absent     Pelvic: Cervical exam deferred        Extremities: Normal range of motion.  Edema: Mild pitting, slight indentation  Mental Status: Normal mood and affect. Normal behavior. Normal judgment and thought content.   Assessment and Plan:  Pregnancy: G4P1021 at [redacted]w[redacted]d  1. Supervision of other normal pregnancy, antepartum 48 lb 9.6 oz (22 kg) 6 lb wt gain in last 2 wks Taking ASA 81 mg daily Began taking Zoloft 50 mg on 01/13/23 and feels it is working well and doesn't want to increase dosage Sees her private counselor 2x/mo Here with mom Not working Has car seat and crib Not exercising; encouraged to do so 3x/wk x 20 min C/o itcy legs and arms without rash; using Dove soap 1x/day with hot water; to monitor and if worsens may need bile acids checked Breakfast: 1 bowl Cheerios Dinner last night: 1 hamburger with ketchup, mustard, and buns, water; pt states she doesn't eat vegetables or fruits and getting food from food pantry because ran out of food stamps  2. 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 Obesity during pregnancy, antepartum Taking ASA 81 mg daily  4. Smoking (tobacco) complicating pregnancy, unspecified trimester Denies smoking  5. Abnormal glucose tolerance test in pregnancy 12/16/22 1 hour glucola=140 3 hour GTT wnl   Preterm labor symptoms and general obstetric precautions including but not limited to vaginal bleeding, contractions, leaking of fluid and fetal movement were reviewed in detail with the patient. Please refer to After Visit Summary for other counseling recommendations.  Return in  about 1 week (around 02/14/2023) for routine PNC.  Future Appointments  Date Time Provider Department Center  02/14/2023  8:40 AM [facility] PROVIDER [facility] None    Alberteen Spindle, CNM

## 2023-02-07 NOTE — Progress Notes (Addendum)
Counseled on recommendation for flu vaccine and RSV vaccine in pregnancy. Flu vaccine declined again. RSV vaccine accepted and tolerated without complaint. Jossie Ng, RN 1 week [facility] RV appt scheduled and reminder card given. Jossie Ng, RN

## 2023-02-14 ENCOUNTER — Inpatient Hospital Stay: Payer: Medicaid Other

## 2023-02-14 ENCOUNTER — Observation Stay
Admission: EM | Admit: 2023-02-14 | Discharge: 2023-02-14 | Disposition: A | Discharge status: Home / Self Care | Payer: Medicaid Other | Attending: Certified Nurse Midwife | Admitting: Certified Nurse Midwife

## 2023-02-14 ENCOUNTER — Other Ambulatory Visit: Payer: Self-pay

## 2023-02-14 ENCOUNTER — Encounter: Payer: Self-pay | Admitting: Obstetrics and Gynecology

## 2023-02-14 ENCOUNTER — Ambulatory Visit: Payer: Medicaid Other | Admitting: Advanced Practice Midwife

## 2023-02-14 VITALS — BP 119/70 | Wt 220.4 lb

## 2023-02-14 DIAGNOSIS — O26893 Other specified pregnancy related conditions, third trimester: Principal | ICD-10-CM | POA: Insufficient documentation

## 2023-02-14 DIAGNOSIS — Z3A36 36 weeks gestation of pregnancy: Secondary | ICD-10-CM | POA: Diagnosis not present

## 2023-02-14 DIAGNOSIS — O208 Other hemorrhage in early pregnancy: Secondary | ICD-10-CM

## 2023-02-14 DIAGNOSIS — O99210 Obesity complicating pregnancy, unspecified trimester: Secondary | ICD-10-CM

## 2023-02-14 DIAGNOSIS — M79661 Pain in right lower leg: Principal | ICD-10-CM | POA: Diagnosis present

## 2023-02-14 DIAGNOSIS — Z3483 Encounter for supervision of other normal pregnancy, third trimester: Secondary | ICD-10-CM

## 2023-02-14 DIAGNOSIS — Z87891 Personal history of nicotine dependence: Secondary | ICD-10-CM | POA: Diagnosis not present

## 2023-02-14 DIAGNOSIS — O99810 Abnormal glucose complicating pregnancy: Secondary | ICD-10-CM

## 2023-02-14 DIAGNOSIS — Z3480 Encounter for supervision of other normal pregnancy, unspecified trimester: Secondary | ICD-10-CM

## 2023-02-14 DIAGNOSIS — B029 Zoster without complications: Secondary | ICD-10-CM

## 2023-02-14 DIAGNOSIS — O99213 Obesity complicating pregnancy, third trimester: Secondary | ICD-10-CM

## 2023-02-14 DIAGNOSIS — R2241 Localized swelling, mass and lump, right lower limb: Secondary | ICD-10-CM | POA: Insufficient documentation

## 2023-02-14 MED ORDER — ACETAMINOPHEN 500 MG PO TABS: 1000.0000 mg | ORAL_TABLET | Freq: Four times a day (QID) | ORAL | Status: DC | PRN

## 2023-02-14 NOTE — Discharge Summary (Signed)
Suzanne Nelson is a 27 y.o. female. She is at [redacted]w[redacted]d gestation. Patient's last menstrual period was 06/03/2022 (exact date). Estimated Date of Delivery: 03/10/23  Prenatal care site: [facility]  Chief complaint: Swelling and pain in Right Lower Leg   HPI: Suzanne Nelson presents to L&D with concerns of swelling and pain in right lower leg. She is accompanied by her mother at bedside. She states that she has been experiencing swelling in her right calf and foot for the past few weeks. Reports this morning the pain was more intense and she had trouble walking around. Rates the pain 6/10. Her mother states that she had experienced DVT's in both of her pregnancies. Suzanne Nelson also reports experiencing intermittently pelvic pressure and discomfort "that comes and goes."    Factors complicating pregnancy: Major Depressive Disorder Single episode, moderate (HCC) Attention Deficit Hyperactivity Disorder, Combined type Oppositional Defiant Disorder Hydronephrosis Calculus of Distal Left Ureter Left Ureteral Stone History of Spontaneous abortion x 2 in last year Subchorionic Hemorrhage in first trimester Obesity- BMI: 40.31 Tobacco use in pregnancy History of eating disorder Migraine without aura w/intractable migraine  Positive Marijuana use in pregnancy Shingles in pregnancy Abnormal glucose tolerance test Spotting affecting pregnancy  S: Resting comfortably. Denies contractions, vaginal bleeding, and leakage of fluid. Endorses active fetal movement.  Maternal Medical History:  Past Medical Hx:  has a past medical history of ADHD (attention deficit hyperactivity disorder), Anemia, Anxiety, Asthma, Asthma, BV (bacterial vaginosis) (10/19/2019), Depression, Eating disorder (08/17/2022), Encounter for care or examination of lactating mother (05/18/2020), Migraine, No pertinent past medical history, Obesity during pregnancy, antepartum (08/17/2022), Panic attack, Pneumonia, and Pyelonephritis affecting pregnancy  in third trimester.    Past Surgical Hx:  has a past surgical history that includes Ureteroscopy with holmium laser lithotripsy (Left, 05/19/2015); Cysto (N/A, 05/19/2015); and STENT PLACE LEFT URETER ([facility] HX).   Allergies  Allergen Reactions   Onion Swelling   Adhesive [Tape] Other (See Comments)    Steri-Strips & Silk tape   Tape Rash    Reaction to Paper tape. Pt denies reaction to tegaderm or surgical tape.     Prior to Admission medications   Medication Sig Start Date End Date Taking? Authorizing Provider  aspirin EC 81 MG tablet Take 1 tablet (81 mg total) by mouth daily. Swallow whole. START taking 08/26/2022 08/17/22  Yes Lenice Llamas, FNP  Prenatal Vit-Fe Fumarate-FA (MULTIVITAMIN-PRENATAL) 27-0.8 MG TABS tablet Take 1 tablet by mouth daily at 12 noon.   Yes [provider]  sertraline (ZOLOFT) 50 MG tablet Take 1 tablet (50 mg total) by mouth daily. 12/02/22  Yes Nelson, Annamarie, PA-C  acetaminophen (TYLENOL) 325 MG tablet Take 2 tablets (650 mg total) by mouth every 4 (four) hours as needed (for pain scale < 4). 05/20/20   Suzanne Nelson, CNM  cyclobenzaprine (FLEXERIL) 5 MG tablet Take 1 tablet (5 mg total) by mouth every 6 (six) hours as needed (Migraines). Patient not taking: Reported on 01/24/2023 08/17/22   Lenice Llamas, FNP  ondansetron (ZOFRAN-ODT) 4 MG disintegrating tablet Take 1 tablet (4 mg total) by mouth every 8 (eight) hours as needed for nausea or vomiting. Patient not taking: Reported on 01/24/2023 07/09/22   Sharman Cheek, MD  riboflavin (VITAMIN B-2) 100 MG TABS tablet Take 1 tablet (100 mg total) by mouth daily. Patient not taking: Reported on 09/10/2022 08/17/22   Lenice Llamas, FNP  albuterol (PROVENTIL HFA;VENTOLIN HFA) 108 (90 Base) MCG/ACT inhaler Inhale 2 puffs into the lungs every 6 (six) hours as  needed for wheezing or shortness of breath. 05/22/18 10/28/19  Enid Derry, PA-C    Social History: She  reports that she quit smoking  about 10 months ago. Her smoking use included cigarettes. She has never used smokeless tobacco. She reports that she does not currently use alcohol. She reports that she does not currently use drugs after having used the following drugs: Marijuana.  Family History: family history includes Breast cancer in her paternal grandmother; Congestive Heart Failure in her mother; Depression in her father and mother; Diabetes Mellitus II in her mother; Hypertension in her father. No history of gyn cancers.  Review of Systems  Constitutional:  Negative for chills and fever.  Eyes: Negative.   Respiratory:  Negative for cough and shortness of breath.   Cardiovascular:  Positive for leg swelling (moderate right pedal/calf swelling). Negative for chest pain and palpitations.  Gastrointestinal:  Negative for abdominal pain.  Genitourinary: Negative.   Neurological:  Negative for dizziness and headaches.  Psychiatric/Behavioral: Negative.      O:  BP 127/67   Pulse 99   Ht 5\' 2"  (1.575 m)   Wt 100 kg   LMP 06/03/2022 (Exact Date)   BMI 40.31 kg/m  No results found for this or any previous visit (from the past 48 hour(s)).   Physical Exam Constitutional:      Appearance: Normal appearance. She is obese.  Eyes:     Extraocular Movements: Extraocular movements intact.     Pupils: Pupils are equal, round, and reactive to light.  Cardiovascular:     Rate and Rhythm: Normal rate.     Pulses: Normal pulses.  Pulmonary:     Effort: Pulmonary effort is normal.  Abdominal:     Palpations: Abdomen is soft.     Tenderness: There is no abdominal tenderness.     Comments: Gravid  Musculoskeletal:        General: Swelling (Right foot and calf) and tenderness present.     Right lower leg: Edema (Grade 3 (5-6 mm) edema noted in right foot) present.  Neurological:     Mental Status: She is alert and oriented to person, place, and time.  Skin:    General: Skin is warm and dry.     Capillary Refill:  Capillary refill takes more than 3 seconds.     Findings: Bruising (Bruising to R lower calf) present.  Psychiatric:        Mood and Affect: Mood normal.        Behavior: Behavior normal.        Thought Content: Thought content normal.        Judgment: Judgment normal.   Pelvic : Deferred  SVE:   Deferred  NST Baseline: 135 bpm Variability: Moderate Accels: Present Decels: None Toco: Occasional  Category: I  US Venous Img Lower Unilateral Right (DVT)       CLINICAL DATA:  Right leg swelling  EXAM: RIGHT LOWER EXTREMITY VENOUS DOPPLER ULTRASOUND  TECHNIQUE: Gray-scale sonography with compression, as well as color and duplex ultrasound, were performed to evaluate the deep venous system(s) from the level of the common femoral vein through the popliteal and proximal calf veins.  COMPARISON:  None Available.  FINDINGS: VENOUS  Normal compressibility of the common femoral, superficial femoral, and popliteal veins, as well as the visualized calf veins. Visualized portions of profunda femoral vein and great saphenous vein unremarkable. No filling defects to suggest DVT on grayscale or color Doppler imaging. Doppler waveforms show normal direction of  venous flow, normal respiratory plasticity and response to augmentation.  Limited views of the contralateral common femoral vein are unremarkable.  OTHER  None.  Limitations: none  IMPRESSION: Negative.   Electronically Signed   By: Darliss Cheney M.D.   On: 02/14/2023 21:28       Assessment: 27 y.o. [redacted]w[redacted]d here for antenatal surveillance during pregnancy.  Principle diagnosis: Swelling in Right Lower Extremity   Plan: Labor: Not present.  Fetal Wellbeing: Reassuring Cat 1 tracing. Reactive NST  US Venous Img Lower Unilateral Right (DVT) ordered; Results: Negative Patient advised to use compression socks or hose for management of lower extremity swelling. Education on the use of compression stockings  provided to patient.  The RICE (rest, ice, compression, and elevation) method recommended to patient in efforts of providing relief of edema, reduce swelling and pain.The RICE method reviewed with patient and education provided to patient.  Patient advised that the use of a maternity belt, heating pad, rest, and changing positions may help provide relief of pelvic pressure. Patient verbalized understanding.  D/c home stable, strict return precautions reviewed, follow-up as needed.   ----- Roney Jaffe, CNM Certified Nurse Midwife [place]  [facility]

## 2023-02-14 NOTE — Progress Notes (Signed)
[facility]  PRENATAL VISIT NOTE  Subjective:  Suzanne Nelson is a 27 y.o. [id number] at [redacted]w[redacted]d being seen today for ongoing prenatal care.  She is currently monitored for the following issues for this low-risk pregnancy and has Major depressive disorder, single episode, moderate (HCC); Attention deficit hyperactivity disorder, combined type; Oppositional defiant disorder; Hydronephrosis; Calculus of distal left ureter; Left ureteral stone; History of spontaneous abortion x 2 in the last year; Subchorionic hemorrhage in first trimester; Supervision of other normal pregnancy, antepartum; Obesity during pregnancy, antepartum; Smoking (tobacco) complicating pregnancy, unspecified trimester; History of eating disorder; Migraine without aura, with intractable migraine, so stated, with status migrainosus; 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; +UDS MJ 09/10/22; Shingles in pregnancy diagnosed 09/02/22 [facility] ER; UTI (urinary tract infection) during pregnancy dx'd [facility] ER 09/12/22; Abnormal glucose tolerance test in pregnancy 12/16/22 1 hour glucola=140; and Spotting affecting pregnancy in third trimester on their problem list.  Patient reports  right foot edema .  Contractions: Not present. Vag. Bleeding: None.  Movement: Present. Denies leaking of fluid/ROM.   The following portions of the patient's history were reviewed and updated as appropriate: allergies, current medications, past family history, past medical history, past social history, past surgical history and problem list. Problem list updated.  Objective:   Vitals:   02/14/23 0910  BP: 119/70  Weight: 220 lb 6 oz (100 kg)    Fetal Status: Fetal Heart Rate (bpm): 160 Fundal Height: 36 cm Movement: Present  Presentation: Vertex  General:  Alert, oriented and cooperative. Patient is in no acute distress.  Skin: Skin is warm and dry. No rash noted.   Cardiovascular: Normal heart rate noted  Respiratory: Normal  respiratory effort, no problems with respiration noted  Abdomen: Soft, gravid, appropriate for gestational age.  Pain/Pressure: Absent     Pelvic: Cervical exam deferred        Extremities: Normal range of motion.  Edema: Mild pitting, slight indentation  Mental Status: Normal mood and affect. Normal behavior. Normal judgment and thought content.   Assessment and Plan:  Pregnancy: G4P1021 at [redacted]w[redacted]d  1. Supervision of other normal pregnancy, antepartum Not working FOB will begin new job soon Taking Zoloft 50 mg daily and sees private counselor 2x/mo C/o edematous right foot again; went to L&D on 01/21/23 for it; suggestions given; right foot markedly more edematous than legt Last MJ gummy 12/02/22 per pt GC/Chlamydia/GC cultures done Has car seat and crib Walking up and down stairs 4x/wk x 15 stairs Knows when to go to L&D  - GBS Culture - Chlamydia/GC NAA, Confirmation  2. Obesity during pregnancy, antepartum Taking ASA 81 mg daily 50 lb 6 oz (22.8 kg)    Preterm labor symptoms and general obstetric precautions including but not limited to vaginal bleeding, contractions, leaking of fluid and fetal movement were reviewed in detail with the patient. Please refer to After Visit Summary for other counseling recommendations.  Return in about 1 week (around 02/21/2023) for routine PNC.  Future Appointments  Date Time Provider Department Center  02/23/2023  9:00 AM [facility] PROVIDER [facility] None    Alberteen Spindle, CNM

## 2023-02-14 NOTE — OB Triage Note (Signed)
26 y.o G4P1021 presents to L&D triage at [redacted]w[redacted]d with the complaints of pelvic pressure/discomfort and right foot pain and swelling. Pt reports that she's had swelling of her foot on and off for the past few weeks but today she began feeling it throughout her calf and says that the pain has worsened, rates it a 6/10. Pt endorses good fetal movement and denies vaginal bleeding, ctx's, rom. Liboon CNM notified of pt arrival. Monitors applied and assessing, vitals wnl.

## 2023-02-16 LAB — CHLAMYDIA/GC NAA, CONFIRMATION
Chlamydia trachomatis, NAA: NEGATIVE
Neisseria gonorrhoeae, NAA: NEGATIVE

## 2023-02-17 DIAGNOSIS — B951 Streptococcus, group B, as the cause of diseases classified elsewhere: Secondary | ICD-10-CM | POA: Insufficient documentation

## 2023-02-17 LAB — CULTURE, BETA STREP (GROUP B ONLY): Strep Gp B Culture: POSITIVE — AB

## 2023-02-18 ENCOUNTER — Other Ambulatory Visit: Payer: Self-pay

## 2023-02-18 ENCOUNTER — Encounter: Payer: Self-pay | Admitting: Obstetrics and Gynecology

## 2023-02-18 ENCOUNTER — Observation Stay
Admission: EM | Admit: 2023-02-18 | Discharge: 2023-02-19 | Disposition: A | Discharge status: Home / Self Care | Payer: Medicaid Other | Attending: Certified Nurse Midwife | Admitting: Certified Nurse Midwife

## 2023-02-18 DIAGNOSIS — Z3A37 37 weeks gestation of pregnancy: Secondary | ICD-10-CM | POA: Diagnosis not present

## 2023-02-18 DIAGNOSIS — O99210 Obesity complicating pregnancy, unspecified trimester: Secondary | ICD-10-CM

## 2023-02-18 DIAGNOSIS — O4703 False labor before 37 completed weeks of gestation, third trimester: Secondary | ICD-10-CM | POA: Diagnosis present

## 2023-02-18 DIAGNOSIS — Z79899 Other long term (current) drug therapy: Secondary | ICD-10-CM | POA: Insufficient documentation

## 2023-02-18 DIAGNOSIS — O99513 Diseases of the respiratory system complicating pregnancy, third trimester: Secondary | ICD-10-CM | POA: Insufficient documentation

## 2023-02-18 DIAGNOSIS — Z87891 Personal history of nicotine dependence: Secondary | ICD-10-CM | POA: Insufficient documentation

## 2023-02-18 DIAGNOSIS — J45909 Unspecified asthma, uncomplicated: Secondary | ICD-10-CM | POA: Insufficient documentation

## 2023-02-18 DIAGNOSIS — B029 Zoster without complications: Secondary | ICD-10-CM

## 2023-02-18 DIAGNOSIS — Z7982 Long term (current) use of aspirin: Secondary | ICD-10-CM | POA: Diagnosis not present

## 2023-02-18 DIAGNOSIS — O479 False labor, unspecified: Principal | ICD-10-CM | POA: Diagnosis present

## 2023-02-18 DIAGNOSIS — B951 Streptococcus, group B, as the cause of diseases classified elsewhere: Secondary | ICD-10-CM

## 2023-02-18 DIAGNOSIS — O99810 Abnormal glucose complicating pregnancy: Secondary | ICD-10-CM

## 2023-02-18 DIAGNOSIS — O4193X0 Disorder of amniotic fluid and membranes, unspecified, third trimester, not applicable or unspecified: Secondary | ICD-10-CM | POA: Insufficient documentation

## 2023-02-18 DIAGNOSIS — O208 Other hemorrhage in early pregnancy: Secondary | ICD-10-CM

## 2023-02-18 DIAGNOSIS — Z3480 Encounter for supervision of other normal pregnancy, unspecified trimester: Secondary | ICD-10-CM

## 2023-02-18 NOTE — OB Triage Note (Signed)
Suzanne Nelson 27 y.o. G4P1 [redacted]w[redacted]d presents to Labor & Delivery triage via wheelchair steered by ED staff reporting contractions since 1930 and possible LOF since 1800. She reports that her underwear were damp, then after church they were soaked. She reports having a hot flash with nausea at [employer]. Nausea is now resolved. She denies signs and symptoms consistent with active vaginal bleeding. She endorses positive fetal movement. External FM and TOCO applied to non-tender abdomen. Initial FHR 165. Vital signs obtained and within normal limits. Patient oriented to care environment including call bell and bed control use. Jazmine Liboon, CNM notified of patient's arrival.

## 2023-02-19 DIAGNOSIS — O479 False labor, unspecified: Principal | ICD-10-CM | POA: Diagnosis present

## 2023-02-19 DIAGNOSIS — O4703 False labor before 37 completed weeks of gestation, third trimester: Secondary | ICD-10-CM | POA: Diagnosis not present

## 2023-02-19 LAB — URINALYSIS, COMPLETE (UACMP) WITH MICROSCOPIC
Bilirubin Urine: NEGATIVE
Glucose, UA: >=500 mg/dL — AB
Hgb urine dipstick: NEGATIVE
Ketones, ur: 5 mg/dL — AB
Nitrite: NEGATIVE
Protein, ur: 30 mg/dL — AB
Specific Gravity, Urine: 1.028 (ref 1.005–1.030)
WBC, UA: >50 WBC/hpf (ref 0–5)
pH: 5 (ref 5.0–8.0)

## 2023-02-19 LAB — WET PREP, GENITAL
Clue Cells Wet Prep HPF POC: NONE SEEN
Sperm: NONE SEEN
Trich, Wet Prep: NONE SEEN
WBC, Wet Prep HPF POC: >=10 — AB (ref <10)
Yeast Wet Prep HPF POC: NONE SEEN

## 2023-02-19 LAB — RUPTURE OF MEMBRANE (ROM)PLUS: Rom Plus: NEGATIVE

## 2023-02-19 MED ORDER — LACTATED RINGERS IV SOLN: 125.0000 mL/h | INTRAVENOUS | Status: DC

## 2023-02-19 MED ORDER — LACTATED RINGERS IV SOLN: INTRAVENOUS | Status: DC

## 2023-02-19 NOTE — OB Triage Note (Signed)
Patient discharged home per order.  She is stable and ambulatory. An After Visit Summary was printed and given to the patient. Discharge education completed with patient and her mother including follow up instructions, appointments, and medication list. She received labor and bleeding precautions. Patient able to verbalize understanding. All questions fully answered upon discharge. Patient instructed to return to ED, call 911, or call provider for any changes in condition. Patient discharged home via personal vehicle with mother and all belongings.

## 2023-02-19 NOTE — Discharge Summary (Signed)
Suzanne Nelson is a 27 y.o. female. She is at [redacted]w[redacted]d gestation. Patient's last menstrual period was 06/03/2022 (exact date). Estimated Date of Delivery: 03/10/23  Prenatal care site: [facility]  Chief complaint: Uterine Contractions and leakage of fluid  HPI: Suzanne Nelson presents to L&D with concerns of uterine contractions and leakage of fluid. Reports contractions started at 1930 and have become stronger and closer together. She also reports possible rupture of membranes around 1800. She states after church, her underwear was damp and then progressed to being soaked. Described the fluid to be clear. Suzanne Nelson also reports experiencing hot flashes with nausea at [employer]. States her nausea resolved on its own. Denies vaginal bleeding and denies foul vaginal odor, vaginal itchiness, burning with urination, and change in consistency of vaginal discharge.   Factors complicating pregnancy: Major Depressive Disorder Single episode, moderate (HCC) Attention Deficit Hyperactivity Disorder, Combined type Oppositional Defiant Disorder Hydronephrosis Calculus of Distal Left Ureter Left Ureteral Stone History of Spontaneous abortion x 2 in last year Subchorionic Hemorrhage in first trimester Obesity- BMI: 40.31 Tobacco use in pregnancy History of eating disorder Migraine without aura w/intractable migraine  Positive Marijuana use in pregnancy Shingles in pregnancy Abnormal glucose tolerance test Spotting affecting pregnancy  S: Resting comfortably. Endorses active fetal movement.  Maternal Medical History:  Past Medical Hx:  has a past medical history of ADHD (attention deficit hyperactivity disorder), Anemia, Anxiety, Asthma, Asthma, BV (bacterial vaginosis) (10/19/2019), Depression, Eating disorder (08/17/2022), Encounter for care or examination of lactating mother (05/18/2020), Migraine, No pertinent past medical history, Obesity during pregnancy, antepartum (08/17/2022), Panic attack, Pneumonia,  and Pyelonephritis affecting pregnancy in third trimester.    Past Surgical Hx:  has a past surgical history that includes Ureteroscopy with holmium laser lithotripsy (Left, 05/19/2015); Cysto (N/A, 05/19/2015); and STENT PLACE LEFT URETER ([facility] HX).   Allergies  Allergen Reactions   Onion Swelling   Adhesive [Tape] Other (See Comments)    Steri-Strips & Silk tape   Tape Rash    Reaction to Paper tape. Pt denies reaction to tegaderm or surgical tape.     Prior to Admission medications   Medication Sig Start Date End Date Taking? Authorizing Provider  acetaminophen (TYLENOL) 325 MG tablet Take 2 tablets (650 mg total) by mouth every 4 (four) hours as needed (for pain scale < 4). 05/20/20  Yes Veal, Katelyn, CNM  aspirin EC 81 MG tablet Take 1 tablet (81 mg total) by mouth daily. Swallow whole. START taking 08/26/2022 08/17/22  Yes Lenice Llamas, FNP  Prenatal Vit-Fe Fumarate-FA (MULTIVITAMIN-PRENATAL) 27-0.8 MG TABS tablet Take 1 tablet by mouth daily at 12 noon.   Yes [provider]  sertraline (ZOLOFT) 50 MG tablet Take 1 tablet (50 mg total) by mouth daily. 12/02/22  Yes Streilein, Annamarie, PA-C  cyclobenzaprine (FLEXERIL) 5 MG tablet Take 1 tablet (5 mg total) by mouth every 6 (six) hours as needed (Migraines). Patient not taking: Reported on 01/24/2023 08/17/22   Lenice Llamas, FNP  ondansetron (ZOFRAN-ODT) 4 MG disintegrating tablet Take 1 tablet (4 mg total) by mouth every 8 (eight) hours as needed for nausea or vomiting. Patient not taking: Reported on 01/24/2023 07/09/22   Sharman Cheek, MD  riboflavin (VITAMIN B-2) 100 MG TABS tablet Take 1 tablet (100 mg total) by mouth daily. Patient not taking: Reported on 09/10/2022 08/17/22   Lenice Llamas, FNP  albuterol (PROVENTIL HFA;VENTOLIN HFA) 108 (90 Base) MCG/ACT inhaler Inhale 2 puffs into the lungs every 6 (six) hours as needed for wheezing or  shortness of breath. 05/22/18 10/28/19  Enid Derry, PA-C    Social  History: She  reports that she quit smoking about 10 months ago. Her smoking use included cigarettes. She has never used smokeless tobacco. She reports that she does not currently use alcohol. She reports that she does not currently use drugs after having used the following drugs: Marijuana.  Family History: family history includes Breast cancer in her paternal grandmother; Congestive Heart Failure in her mother; Depression in her father and mother; Diabetes Mellitus II in her mother; Hypertension in her father. No history of gyn cancers  Review of Systems: A full review of systems was performed and negative except as noted in the HPI.    O:  BP 129/65 (BP Location: Right Arm)   Pulse (!) 103   Temp 98.1 F (36.7 C) (Oral)   Resp 18   Ht 5\' 2"  (1.575 m)   Wt 100 kg   LMP 06/03/2022 (Exact Date)   BMI 40.31 kg/m  Results for orders placed or performed during the hospital encounter of 02/18/23 (from the past 48 hour(s))  Rupture of Membrane (ROM) Plus   Collection Time: 02/18/23 11:49 PM  Result Value Ref Range   Rom Plus NEGATIVE   Urinalysis, Complete w Microscopic -Urine, Clean Catch   Collection Time: 02/19/23 12:11 AM  Result Value Ref Range   Color, Urine YELLOW (A) YELLOW   APPearance CLOUDY (A) CLEAR   Specific Gravity, Urine 1.028 1.005 - 1.030   pH 5.0 5.0 - 8.0   Glucose, UA >=500 (A) NEGATIVE mg/dL   Hgb urine dipstick NEGATIVE NEGATIVE   Bilirubin Urine NEGATIVE NEGATIVE   Ketones, ur 5 (A) NEGATIVE mg/dL   Protein, ur 30 (A) NEGATIVE mg/dL   Nitrite NEGATIVE NEGATIVE   Leukocytes,Ua MODERATE (A) NEGATIVE   RBC / HPF 6-10 0 - 5 RBC/hpf   WBC, UA >50 0 - 5 WBC/hpf   Bacteria, UA MANY (A) NONE SEEN   Squamous Epithelial / HPF 21-50 0 - 5 /HPF   Mucus PRESENT   Wet prep, genital   Collection Time: 02/19/23 12:24 AM   Specimen: Vaginal  Result Value Ref Range   Yeast Wet Prep HPF POC NONE SEEN NONE SEEN   Trich, Wet Prep NONE SEEN NONE SEEN   Clue Cells Wet Prep  HPF POC NONE SEEN NONE SEEN   WBC, Wet Prep HPF POC >=10 (A) <10   Sperm NONE SEEN      Constitutional: NAD, AAOx3  HE/ENT: extraocular movements grossly intact, moist mucous membranes CV: RRR PULM: nl respiratory effort Abd: gravid, non-tender, non-distended, soft  Ext: Non-tender Psych: mood appropriate, speech normal Pelvic : Deferred  SVE: Dilation: 1 Effacement (%): 50 Cervical Position: Posterior Station: -3 Presentation: Vertex Exam by:: [place] Guptill   NST Baseline: 125 bpm Variability: moderate Accels: Present Decels: none Toco: irregular, every 2.5-10 minutes  Results for orders placed or performed during the hospital encounter of 02/18/23 (from the past 24 hour(s))  Rupture of Membrane (ROM) Plus     Status: None   Collection Time: 02/18/23 11:49 PM  Result Value Ref Range   Rom Plus NEGATIVE   Urinalysis, Complete w Microscopic -Urine, Clean Catch     Status: Abnormal   Collection Time: 02/19/23 12:11 AM  Result Value Ref Range   Color, Urine YELLOW (A) YELLOW   APPearance CLOUDY (A) CLEAR   Specific Gravity, Urine 1.028 1.005 - 1.030   pH 5.0 5.0 - 8.0  Glucose, UA >=500 (A) NEGATIVE mg/dL   Hgb urine dipstick NEGATIVE NEGATIVE   Bilirubin Urine NEGATIVE NEGATIVE   Ketones, ur 5 (A) NEGATIVE mg/dL   Protein, ur 30 (A) NEGATIVE mg/dL   Nitrite NEGATIVE NEGATIVE   Leukocytes,Ua MODERATE (A) NEGATIVE   RBC / HPF 6-10 0 - 5 RBC/hpf   WBC, UA >50 0 - 5 WBC/hpf   Bacteria, UA MANY (A) NONE SEEN   Squamous Epithelial / HPF 21-50 0 - 5 /HPF   Mucus PRESENT   Wet prep, genital     Status: Abnormal   Collection Time: 02/19/23 12:24 AM   Specimen: Vaginal  Result Value Ref Range   Yeast Wet Prep HPF POC NONE SEEN NONE SEEN   Trich, Wet Prep NONE SEEN NONE SEEN   Clue Cells Wet Prep HPF POC NONE SEEN NONE SEEN   WBC, Wet Prep HPF POC >=10 (A) <10   Sperm NONE SEEN      Category: I  Assessment: 27 y.o. [redacted]w[redacted]d here for antenatal surveillance during  pregnancy. Low suspicion for rupture of membranes and active labor.   Principle diagnosis: Uterine Contractions    Plan: Wet prep collected; Results: Negative Urinalysis collected; Results: Negative for UTI ROM + collected; Results: Negative Active labor: Not present. Cervix 1/50/-3. Contractions irregular, every 2.5-10 mins. Contraction quality mild. Patient reports not feeling contractions. Resting tone soft. Ambulation in L&D hallways encouraged. Cervix unchanged after 2 hour cervical exam recheck.  Patient given 2 pitchers of ice water. Patient advised to maintain an adequate amount of water intake (at least 8-12 glasses of water daily). Signs and symptoms of labor and Braxton Hicks contractions reviewed and education provided to patient. All questions answered. Patient verbalized understanding.  Fetal Wellbeing: Reassuring Cat 1 tracing. Reactive NST  D/c home stable, strict return precautions reviewed, follow-up as needed.   ----- Roney Jaffe, CNM Certified Nurse Midwife [place]  [facility]

## 2023-02-22 NOTE — Progress Notes (Unsigned)
[facility]  PRENATAL VISIT NOTE  Subjective:  Suzanne Nelson is a 27 y.o. [phone] at [redacted]w[redacted]d being seen today for ongoing prenatal care.  She is currently monitored for the following issues for this high-risk pregnancy and has Major depressive disorder, single episode, moderate (HCC); Attention deficit hyperactivity disorder, combined type; Oppositional defiant disorder; Hydronephrosis; Calculus of distal left ureter; Left ureteral stone; History of spontaneous abortion x 2 in the last year; Subchorionic hemorrhage in first trimester; Supervision of other normal pregnancy, antepartum; Obesity during pregnancy, antepartum; Smoking (tobacco) complicating pregnancy, unspecified trimester; History of eating disorder; Migraine without aura, with intractable migraine, so stated, with status migrainosus; 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  Patient reports {sx:14538}.   .  .   . Denies leaking of fluid/ROM.   The following portions of the patient's history were reviewed and updated as appropriate: allergies, current medications, past family history, past medical history, past social history, past surgical history and problem list. Problem list updated.  Objective:  There were no vitals filed for this visit.  Fetal Status:           General:  Alert, oriented and cooperative. Patient is in no acute distress.  Skin: Skin is warm and dry. No rash noted.   Cardiovascular: Normal heart rate noted  Respiratory: Normal respiratory effort, no problems with respiration noted  Abdomen: Soft, gravid, appropriate for  gestational age.        Pelvic: Cervical exam deferred        Extremities: Normal range of motion.     Mental Status: Normal mood and affect. Normal behavior. Normal judgment and thought content.   Assessment and Plan:  Pregnancy: G4P1021 at [redacted]w[redacted]d  1. Supervision of other normal pregnancy, antepartum -recently went to ER on 11/1 for possible ROM, negative and discharged home -taking PNV daily  2. Obesity during pregnancy, antepartum -taking ASA daily -weight -exercise  3. Major depressive disorder, single episode, moderate (HCC) -taking zoloft daily  4. Smoking (tobacco) complicating pregnancy, unspecified trimester -smoking   5. 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; +UDS MJ 09/10/22 -last gummy in August, denies recent use  6. Pain and swelling of right lower leg ***  7. [redacted] weeks gestation of pregnancy ***   Term labor symptoms and general obstetric precautions including but not limited to vaginal bleeding, contractions, leaking of fluid and fetal movement were reviewed in detail with the patient. Please refer to After Visit Summary for other counseling recommendations.  No follow-ups on file.  Future Appointments  Date Time Provider Department Center  02/23/2023  9:00 AM [facility] PROVIDER [facility] None    Lenice Llamas, FNP

## 2023-02-23 ENCOUNTER — Ambulatory Visit: Payer: Medicaid Other | Admitting: Family Medicine

## 2023-02-23 VITALS — BP 106/67 | HR 96 | Temp 96.8°F | Wt 225.0 lb

## 2023-02-23 DIAGNOSIS — Z3483 Encounter for supervision of other normal pregnancy, third trimester: Secondary | ICD-10-CM

## 2023-02-23 DIAGNOSIS — F1290 Cannabis use, unspecified, uncomplicated: Secondary | ICD-10-CM

## 2023-02-23 DIAGNOSIS — O99330 Smoking (tobacco) complicating pregnancy, unspecified trimester: Secondary | ICD-10-CM

## 2023-02-23 DIAGNOSIS — Z3A37 37 weeks gestation of pregnancy: Secondary | ICD-10-CM

## 2023-02-23 DIAGNOSIS — O99213 Obesity complicating pregnancy, third trimester: Secondary | ICD-10-CM

## 2023-02-23 DIAGNOSIS — O99333 Smoking (tobacco) complicating pregnancy, third trimester: Secondary | ICD-10-CM

## 2023-02-23 DIAGNOSIS — M79661 Pain in right lower leg: Secondary | ICD-10-CM

## 2023-02-23 DIAGNOSIS — O99210 Obesity complicating pregnancy, unspecified trimester: Secondary | ICD-10-CM

## 2023-02-23 DIAGNOSIS — M7989 Other specified soft tissue disorders: Secondary | ICD-10-CM

## 2023-02-23 DIAGNOSIS — F321 Major depressive disorder, single episode, moderate: Secondary | ICD-10-CM

## 2023-02-23 DIAGNOSIS — Z3480 Encounter for supervision of other normal pregnancy, unspecified trimester: Secondary | ICD-10-CM

## 2023-02-23 LAB — URINALYSIS
Bilirubin, UA: NEGATIVE
Glucose, UA: NEGATIVE
Ketones, UA: NEGATIVE
Nitrite, UA: NEGATIVE
RBC, UA: NEGATIVE
Specific Gravity, UA: 1.02 (ref 1.005–1.030)
Urobilinogen, Ur: 0.2 mg/dL (ref 0.2–1.0)
pH, UA: 8.5 — ABNORMAL HIGH (ref 5.0–7.5)

## 2023-02-23 NOTE — Progress Notes (Addendum)
GBS handout given and counseling. In house urine result reviewed by provider during visit. BTHIELE RN

## 2023-02-26 ENCOUNTER — Observation Stay
Admission: EM | Admit: 2023-02-26 | Discharge: 2023-02-26 | Disposition: A | Discharge status: Home / Self Care | Payer: Medicaid Other | Attending: Obstetrics and Gynecology | Admitting: Obstetrics and Gynecology

## 2023-02-26 ENCOUNTER — Other Ambulatory Visit: Payer: Self-pay

## 2023-02-26 ENCOUNTER — Encounter: Payer: Self-pay | Admitting: Obstetrics and Gynecology

## 2023-02-26 DIAGNOSIS — Z3A38 38 weeks gestation of pregnancy: Secondary | ICD-10-CM | POA: Insufficient documentation

## 2023-02-26 DIAGNOSIS — O208 Other hemorrhage in early pregnancy: Principal | ICD-10-CM

## 2023-02-26 DIAGNOSIS — J45909 Unspecified asthma, uncomplicated: Secondary | ICD-10-CM | POA: Insufficient documentation

## 2023-02-26 DIAGNOSIS — B029 Zoster without complications: Secondary | ICD-10-CM

## 2023-02-26 DIAGNOSIS — Z3490 Encounter for supervision of normal pregnancy, unspecified, unspecified trimester: Secondary | ICD-10-CM

## 2023-02-26 DIAGNOSIS — O99513 Diseases of the respiratory system complicating pregnancy, third trimester: Secondary | ICD-10-CM | POA: Insufficient documentation

## 2023-02-26 DIAGNOSIS — Z79899 Other long term (current) drug therapy: Secondary | ICD-10-CM | POA: Diagnosis not present

## 2023-02-26 DIAGNOSIS — O471 False labor at or after 37 completed weeks of gestation: Secondary | ICD-10-CM | POA: Diagnosis present

## 2023-02-26 DIAGNOSIS — Z7982 Long term (current) use of aspirin: Secondary | ICD-10-CM | POA: Diagnosis not present

## 2023-02-26 DIAGNOSIS — O99810 Abnormal glucose complicating pregnancy: Secondary | ICD-10-CM

## 2023-02-26 DIAGNOSIS — Z87891 Personal history of nicotine dependence: Secondary | ICD-10-CM | POA: Diagnosis not present

## 2023-02-26 DIAGNOSIS — Z3480 Encounter for supervision of other normal pregnancy, unspecified trimester: Secondary | ICD-10-CM

## 2023-02-26 DIAGNOSIS — M545 Low back pain, unspecified: Secondary | ICD-10-CM | POA: Diagnosis present

## 2023-02-26 DIAGNOSIS — B951 Streptococcus, group B, as the cause of diseases classified elsewhere: Secondary | ICD-10-CM

## 2023-02-26 DIAGNOSIS — O99210 Obesity complicating pregnancy, unspecified trimester: Secondary | ICD-10-CM

## 2023-02-26 LAB — WET PREP, GENITAL
Clue Cells Wet Prep HPF POC: NONE SEEN
Sperm: NONE SEEN
Trich, Wet Prep: NONE SEEN
WBC, Wet Prep HPF POC: >=10 — AB (ref <10)
Yeast Wet Prep HPF POC: NONE SEEN

## 2023-02-26 LAB — URINALYSIS, ROUTINE W REFLEX MICROSCOPIC
Bilirubin Urine: NEGATIVE
Glucose, UA: NEGATIVE mg/dL
Hgb urine dipstick: NEGATIVE
Ketones, ur: NEGATIVE mg/dL
Nitrite: NEGATIVE
Protein, ur: NEGATIVE mg/dL
Specific Gravity, Urine: 1.018 (ref 1.005–1.030)
pH: 5 (ref 5.0–8.0)

## 2023-02-26 LAB — CHLAMYDIA/NGC RT PCR (ARMC ONLY)
Chlamydia Tr: NOT DETECTED
N gonorrhoeae: NOT DETECTED

## 2023-02-26 NOTE — OB Triage Note (Signed)
27 y.o G4P1 presents to L&D triage at [redacted]w[redacted]d w c/o abdominal pain, back pain, and possible ctx's. Pt endorses fetal movement and denies lof, vaginal bleeding. UA, Wet prep returned wnl. Pt showed positive NST and labor evaluation showed no cervical change. Cervix was 1.5/50/-3. Pt being discharged home by [facility]. Signs and symptoms of labor reviewed w pt and pain control options reviewed. Pt verbalized understanding of discharge instructions and sent home stable with mother.

## 2023-02-26 NOTE — Discharge Summary (Cosign Needed Addendum)
Suzanne Nelson is a 27 y.o. female. She is at [redacted]w[redacted]d gestation. Patient's last menstrual period was 06/03/2022 (exact date). Estimated Date of Delivery: 03/10/23  Prenatal care site: [facility]  Chief complaint: observationUterine contractions, abdominal pain and lower back pain.  HPI: Suzanne Nelson presents to L&D with complaints of abdominal pain, back pain, and possible ctx's. Patient reported that she took 500 mg of tylenol before coming to triage without relief.   Factors complicating pregnancy: Major Depressive Disorder Single episode, moderate (HCC) Attention Deficit Hyperactivity Disorder, Combined type Oppositional Defiant Disorder Hydronephrosis Calculus of Distal Left Ureter Left Ureteral Stone History of Spontaneous abortion x 2 in last year Subchorionic Hemorrhage in first trimester Obesity- BMI: 40.31 Tobacco use in pregnancy History of eating disorder Migraine without aura w/intractable migraine  Positive Marijuana use in pregnancy Shingles in pregnancy Abnormal glucose tolerance test Spotting affecting pregnancy  S: Resting comfortably. no VB.no LOF,  Active fetal movement.   Maternal Medical History:  Past Medical Hx:  has a past medical history of ADHD (attention deficit hyperactivity disorder), Anemia, Anxiety, Asthma, Asthma, BV (bacterial vaginosis) (10/19/2019), Depression, Eating disorder (08/17/2022), Encounter for care or examination of lactating mother (05/18/2020), Migraine, No pertinent past medical history, Obesity during pregnancy, antepartum (08/17/2022), Panic attack, Pneumonia, and Pyelonephritis affecting pregnancy in third trimester.    Past Surgical Hx:  has a past surgical history that includes Ureteroscopy with holmium laser lithotripsy (Left, 05/19/2015); Cysto (N/A, 05/19/2015); and STENT PLACE LEFT URETER ([facility] HX).   Allergies  Allergen Reactions   Onion Swelling   Adhesive [Tape] Other (See Comments)    Steri-Strips & Silk tape   Tape Rash     Reaction to Paper tape. Pt denies reaction to tegaderm or surgical tape.     Prior to Admission medications   Medication Sig Start Date End Date Taking? Authorizing Provider  acetaminophen (TYLENOL) 325 MG tablet Take 2 tablets (650 mg total) by mouth every 4 (four) hours as needed (for pain scale < 4). 05/20/20  Yes Veal, Katelyn, CNM  aspirin EC 81 MG tablet Take 1 tablet (81 mg total) by mouth daily. Swallow whole. START taking 08/26/2022 08/17/22  Yes Lenice Llamas, FNP  Prenatal Vit-Fe Fumarate-FA (MULTIVITAMIN-PRENATAL) 27-0.8 MG TABS tablet Take 1 tablet by mouth daily at 12 noon.   Yes [provider]  sertraline (ZOLOFT) 50 MG tablet Take 1 tablet (50 mg total) by mouth daily. 12/02/22  Yes Streilein, Annamarie, PA-C  albuterol (PROVENTIL HFA;VENTOLIN HFA) 108 (90 Base) MCG/ACT inhaler Inhale 2 puffs into the lungs every 6 (six) hours as needed for wheezing or shortness of breath. 05/22/18 10/28/19  Enid Derry, PA-C    Social History: She  reports that she quit smoking about 10 months ago. Her smoking use included cigarettes. She has never used smokeless tobacco. She reports that she does not currently use alcohol. She reports that she does not currently use drugs after having used the following drugs: Marijuana.  Family History: family history includes Breast cancer in her paternal grandmother; Congestive Heart Failure in her mother; Depression in her father and mother; Diabetes Mellitus II in her mother; Hypertension in her father. ,no history of gyn cancers  Review of Systems: A full review of systems was performed and negative except as noted in the HPI.    O:  Temp 98 F (36.7 C) (Oral)   Resp 16   Ht 5\' 2"  (1.575 m)   Wt 102.1 kg   LMP 06/03/2022 (Exact Date)   BMI 41.15  kg/m  Results for orders placed or performed during the hospital encounter of 02/26/23 (from the past 48 hour(s))  Wet prep, genital   Collection Time: 02/26/23  3:10 PM   Specimen: Urine, Clean  Catch  Result Value Ref Range   Yeast Wet Prep HPF POC NONE SEEN NONE SEEN   Trich, Wet Prep NONE SEEN NONE SEEN   Clue Cells Wet Prep HPF POC NONE SEEN NONE SEEN   WBC, Wet Prep HPF POC >=10 (A) <10   Sperm NONE SEEN   Chlamydia/NGC rt PCR ([facility] only)   Collection Time: 02/26/23  3:10 PM   Specimen: Urine, Clean Catch  Result Value Ref Range   Specimen source GC/Chlam URINE, RANDOM    Chlamydia Tr NOT DETECTED NOT DETECTED   N gonorrhoeae NOT DETECTED NOT DETECTED  Urinalysis, Routine w reflex microscopic -Urine, Clean Catch   Collection Time: 02/26/23  3:10 PM  Result Value Ref Range   Color, Urine YELLOW (A) YELLOW   APPearance CLOUDY (A) CLEAR   Specific Gravity, Urine 1.018 1.005 - 1.030   pH 5.0 5.0 - 8.0   Glucose, UA NEGATIVE NEGATIVE mg/dL   Hgb urine dipstick NEGATIVE NEGATIVE   Bilirubin Urine NEGATIVE NEGATIVE   Ketones, ur NEGATIVE NEGATIVE mg/dL   Protein, ur NEGATIVE NEGATIVE mg/dL   Nitrite NEGATIVE NEGATIVE   Leukocytes,Ua LARGE (A) NEGATIVE   RBC / HPF 6-10 0 - 5 RBC/hpf   WBC, UA 21-50 0 - 5 WBC/hpf   Bacteria, UA RARE (A) NONE SEEN   Squamous Epithelial / HPF 21-50 0 - 5 /HPF   Mucus PRESENT    Ca Oxalate Crys, UA PRESENT      Constitutional: NAD, AAOx3  HE/ENT: extraocular movements grossly intact, moist mucous membranes CV: RRR PULM: nl respiratory effort, CTABL Abd: gravid, non-tender, non-distended, soft  Ext: Non-tender, Nonedmeatous Psych: mood appropriate, speech normal Pelvic : deferred SVE: Dilation: 1.5 Effacement (%): 50 Station: -3 Presentation: Vertex Exam by:: Rebelo, RN   NST: Baseline FHR: 135 beats/min Variability: moderate Accelerations: present Decelerations: absent Tocometry: occasional contractions Time: at least 20 minutes   Interpretation: Category I INDICATIONS: rule out uterine contractions RESULTS:  A NST procedure was performed with FHR monitoring and a normal baseline established, appropriate time of 20-40  minutes of evaluation, and accels >2 seen w 15x15 characteristics.  Results show a REACTIVE NST.    Assessment: 27 y.o. [redacted]w[redacted]d here for antenatal surveillance during pregnancy. Patient observed for 2 hours, no cervical change or signs of labor noted, labs wnl  Principle diagnosis:  The primary encounter diagnosis was Subchorionic hemorrhage in first trimester. Diagnoses of Supervision of other normal pregnancy, antepartum, Obesity during pregnancy, antepartum, Shingles in pregnancy diagnosed 09/02/22 [facility] ER, Abnormal glucose tolerance test in pregnancy 12/16/22 1 hour glucola=140, and Positive GBS test were also pertinent to this visit.   Plan: Labor: not present.  Fetal Wellbeing: Reassuring Cat 1 tracing. Reactive NST  Labs WNL D/c home stable, precautions reviewed, follow-up as scheduled.   ----- Chari Manning, CNM Certified Nurse Midwife [facility]

## 2023-02-26 NOTE — OB Triage Note (Signed)
Pt states she has had abdominal and back pain since last night. She reports being unable to determine between ctx and fetal movement. Denies vaginal bleeding, LOF. Suzanne Nelson

## 2023-02-28 ENCOUNTER — Encounter: Payer: Self-pay | Admitting: Obstetrics and Gynecology

## 2023-02-28 ENCOUNTER — Observation Stay
Admission: EM | Admit: 2023-02-28 | Discharge: 2023-02-28 | Disposition: A | Discharge status: Home / Self Care | Payer: Medicaid Other | Attending: Obstetrics and Gynecology | Admitting: Obstetrics and Gynecology

## 2023-02-28 ENCOUNTER — Other Ambulatory Visit: Payer: Self-pay

## 2023-02-28 DIAGNOSIS — H5310 Unspecified subjective visual disturbances: Secondary | ICD-10-CM | POA: Insufficient documentation

## 2023-02-28 DIAGNOSIS — O1203 Gestational edema, third trimester: Secondary | ICD-10-CM | POA: Diagnosis not present

## 2023-02-28 DIAGNOSIS — Z3A38 38 weeks gestation of pregnancy: Secondary | ICD-10-CM | POA: Insufficient documentation

## 2023-02-28 DIAGNOSIS — O99810 Abnormal glucose complicating pregnancy: Secondary | ICD-10-CM

## 2023-02-28 DIAGNOSIS — B029 Zoster without complications: Secondary | ICD-10-CM

## 2023-02-28 DIAGNOSIS — O99891 Other specified diseases and conditions complicating pregnancy: Secondary | ICD-10-CM | POA: Diagnosis present

## 2023-02-28 DIAGNOSIS — R519 Headache, unspecified: Secondary | ICD-10-CM | POA: Insufficient documentation

## 2023-02-28 DIAGNOSIS — B951 Streptococcus, group B, as the cause of diseases classified elsewhere: Secondary | ICD-10-CM

## 2023-02-28 DIAGNOSIS — O99713 Diseases of the skin and subcutaneous tissue complicating pregnancy, third trimester: Secondary | ICD-10-CM | POA: Insufficient documentation

## 2023-02-28 DIAGNOSIS — G43909 Migraine, unspecified, not intractable, without status migrainosus: Secondary | ICD-10-CM | POA: Diagnosis present

## 2023-02-28 DIAGNOSIS — Z3480 Encounter for supervision of other normal pregnancy, unspecified trimester: Secondary | ICD-10-CM

## 2023-02-28 DIAGNOSIS — H43393 Other vitreous opacities, bilateral: Secondary | ICD-10-CM | POA: Diagnosis not present

## 2023-02-28 DIAGNOSIS — O208 Other hemorrhage in early pregnancy: Principal | ICD-10-CM

## 2023-02-28 DIAGNOSIS — O99210 Obesity complicating pregnancy, unspecified trimester: Secondary | ICD-10-CM

## 2023-02-28 LAB — RESP PANEL BY RT-PCR (RSV, FLU A&B, COVID)  RVPGX2
Influenza A by PCR: NEGATIVE
Influenza B by PCR: NEGATIVE
Resp Syncytial Virus by PCR: NEGATIVE
SARS Coronavirus 2 by RT PCR: NEGATIVE

## 2023-02-28 LAB — COMPREHENSIVE METABOLIC PANEL
ALT: 15 U/L (ref 0–44)
AST: 21 U/L (ref 15–41)
Albumin: 2.5 g/dL — ABNORMAL LOW (ref 3.5–5.0)
Alkaline Phosphatase: 181 U/L — ABNORMAL HIGH (ref 38–126)
Anion gap: 5 (ref 5–15)
BUN: 9 mg/dL (ref 6–20)
CO2: 24 mmol/L (ref 22–32)
Calcium: 8.2 mg/dL — ABNORMAL LOW (ref 8.9–10.3)
Chloride: 106 mmol/L (ref 98–111)
Creatinine, Ser: 0.56 mg/dL (ref 0.44–1.00)
GFR, Estimated: >60 mL/min (ref >60)
Glucose, Bld: 95 mg/dL (ref 70–99)
Potassium: 3.9 mmol/L (ref 3.5–5.1)
Sodium: 135 mmol/L (ref 135–145)
Total Bilirubin: 0.4 mg/dL (ref <1.2)
Total Protein: 6 g/dL — ABNORMAL LOW (ref 6.5–8.1)

## 2023-02-28 LAB — PROTEIN / CREATININE RATIO, URINE
Creatinine, Urine: 57 mg/dL
Protein Creatinine Ratio: 0.18 mg/mg{creat} — ABNORMAL HIGH (ref 0.00–0.15)
Total Protein, Urine: 10 mg/dL

## 2023-02-28 LAB — CBC
HCT: 29 % — ABNORMAL LOW (ref 36.0–46.0)
Hemoglobin: 9.3 g/dL — ABNORMAL LOW (ref 12.0–15.0)
MCH: 25 pg — ABNORMAL LOW (ref 26.0–34.0)
MCHC: 32.1 g/dL (ref 30.0–36.0)
MCV: 78 fL — ABNORMAL LOW (ref 80.0–100.0)
Platelets: 275 10*3/uL (ref 150–400)
RBC: 3.72 MIL/uL — ABNORMAL LOW (ref 3.87–5.11)
RDW: 14.1 % (ref 11.5–15.5)
WBC: 16.2 10*3/uL — ABNORMAL HIGH (ref 4.0–10.5)
nRBC: 0 % (ref 0.0–0.2)

## 2023-02-28 MED ORDER — DIPHENHYDRAMINE HCL 25 MG PO CAPS
25.0000 mg | ORAL_CAPSULE | Freq: Once | ORAL | Status: AC
  Administered 2023-02-28: 25 mg via ORAL
  Filled 2023-02-28: qty 1

## 2023-02-28 MED ORDER — MAGNESIUM SULFATE IN D5W 1-5 GM/100ML-% IV SOLN
1.0000 g | Freq: Once | INTRAVENOUS | Status: AC
  Administered 2023-02-28: 1 g via INTRAVENOUS
  Filled 2023-02-28: qty 100

## 2023-02-28 MED ORDER — PROCHLORPERAZINE EDISYLATE 10 MG/2ML IJ SOLN
10.0000 mg | Freq: Once | INTRAMUSCULAR | Status: AC
  Administered 2023-02-28: 10 mg via INTRAVENOUS
  Filled 2023-02-28: qty 2

## 2023-02-28 MED ORDER — LACTATED RINGERS IV BOLUS
1000.0000 mL | Freq: Once | INTRAVENOUS | Status: AC
  Administered 2023-02-28: 1000 mL via INTRAVENOUS

## 2023-02-28 MED ORDER — BUTALBITAL-APAP-CAFFEINE 50-325-40 MG PO TABS
2.0000 | ORAL_TABLET | Freq: Four times a day (QID) | ORAL | Status: DC | PRN
  Administered 2023-02-28: 2 via ORAL
  Filled 2023-02-28: qty 2

## 2023-02-28 NOTE — Discharge Summary (Signed)
Patient ID: Suzanne Nelson MRN: [id number] DOB/AGE: [birth year] 27 y.o.  Admit date: 02/28/2023 Discharge date: 02/28/2023  Admission Diagnoses: 27yo G4P1 at [redacted]w[redacted]d presents with vision changes and the worst HA. She has a history of Migraine HAs.   Discharge Diagnoses: HA resolved  Factors complicating pregnancy: Major Depressive Disorder Single episode, moderate (HCC) Attention Deficit Hyperactivity Disorder, Combined type Oppositional Defiant Disorder Hydronephrosis Calculus of Distal Left Ureter Left Ureteral Stone History of Spontaneous abortion x 2 in last year Subchorionic Hemorrhage in first trimester Obesity- BMI: 40.31 Tobacco use in pregnancy History of eating disorder Migraine without aura w/intractable migraine  Positive Marijuana use in pregnancy Shingles in pregnancy Abnormal glucose tolerance test Spotting affecting pregnancy  Prenatal Procedures: NST  Consults: None  Significant Diagnostic Studies:  Results for orders placed or performed during the hospital encounter of 02/28/23 (from the past 168 hour(s))  Resp panel by RT-PCR (RSV, Flu A&B, Covid) Anterior Nasal Swab   Collection Time: 02/28/23  2:46 PM   Specimen: Anterior Nasal Swab  Result Value Ref Range   SARS Coronavirus 2 by RT PCR NEGATIVE NEGATIVE   Influenza A by PCR NEGATIVE NEGATIVE   Influenza B by PCR NEGATIVE NEGATIVE   Resp Syncytial Virus by PCR NEGATIVE NEGATIVE  Protein / creatinine ratio, urine   Collection Time: 02/28/23  2:46 PM  Result Value Ref Range   Creatinine, Urine 57 mg/dL   Total Protein, Urine 10 mg/dL   Protein Creatinine Ratio 0.18 (H) 0.00 - 0.15 mg/mg[Cre]  Comprehensive metabolic panel   Collection Time: 02/28/23  3:15 PM  Result Value Ref Range   Sodium 135 135 - 145 mmol/L   Potassium 3.9 3.5 - 5.1 mmol/L   Chloride 106 98 - 111 mmol/L   CO2 24 22 - 32 mmol/L   Glucose, Bld 95 70 - 99 mg/dL   BUN 9 6 - 20 mg/dL   Creatinine, Ser 9.62 0.44 - 1.00 mg/dL    Calcium 8.2 (L) 8.9 - 10.3 mg/dL   Total Protein 6.0 (L) 6.5 - 8.1 g/dL   Albumin 2.5 (L) 3.5 - 5.0 g/dL   AST 21 15 - 41 U/L   ALT 15 0 - 44 U/L   Alkaline Phosphatase 181 (H) 38 - 126 U/L   Total Bilirubin 0.4 <1.2 mg/dL   GFR, Estimated >95 >28 mL/min   Anion gap 5 5 - 15  CBC   Collection Time: 02/28/23  3:15 PM  Result Value Ref Range   WBC 16.2 (H) 4.0 - 10.5 K/uL   RBC 3.72 (L) 3.87 - 5.11 MIL/uL   Hemoglobin 9.3 (L) 12.0 - 15.0 g/dL   HCT 41.3 (L) 24.4 - 01.0 %   MCV 78.0 (L) 80.0 - 100.0 fL   MCH 25.0 (L) 26.0 - 34.0 pg   MCHC 32.1 30.0 - 36.0 g/dL   RDW 27.2 53.6 - 64.4 %   Platelets 275 150 - 400 K/uL   nRBC 0.0 0.0 - 0.2 %    Treatments: IV hydration and Headache treatment - Fioricet then Benadryl, Compazine, and Mag  Hospital Course:  This is a 27 y.o. [id number] with IUP at [redacted]w[redacted]d seen for the worse HA ever with vision changes.  Fioricet was given with some resolution.  She was then given Benadryl, Compazine, and Mag.  An IV was also started and LR infusion.  She was observed, fetal heart rate monitoring remained reassuring, and she had no signs/symptoms of  labor or other maternal-fetal concerns.  She  was deemed stable for discharge to home with outpatient follow up.  Discharge Physical Exam:  BP 115/74   Pulse 96   Temp 97.8 F (36.6 C) (Oral)   Resp 18   LMP 06/03/2022 (Exact Date)  Vitals:   02/28/23 1441 02/28/23 1450 02/28/23 1540 02/28/23 1600  BP: 116/63 110/65 114/63 114/69   02/28/23 1620 02/28/23 1640 02/28/23 1700 02/28/23 1720  BP: 122/76 112/75 108/70 118/69   02/28/23 1740  BP: 115/74      General: NAD CV: RRR Pulm: nl effort ABD: s/nd/nt, gravid DVT Evaluation: LE non-ttp, no evidence of DVT on exam.  NST: FHR baseline: 145 bpm Variability: moderate Accelerations: yes Decelerations: none Time: 1440 - 1940 Category/reactivity: reactive  TOCO: quiet SVE: deferred      Discharge Condition: Stable  Disposition:  Discharge  disposition: 01-Home or Self Care        Allergies as of 02/28/2023       Reactions   Onion Swelling   Adhesive [tape] Other (See Comments)   Steri-Strips & Silk tape   Tape Rash   Reaction to Paper tape. Pt denies reaction to tegaderm or surgical tape.        Medication List     TAKE these medications    acetaminophen 325 MG tablet Commonly known as: Tylenol Take 2 tablets (650 mg total) by mouth every 4 (four) hours as needed (for pain scale < 4).   aspirin EC 81 MG tablet Take 1 tablet (81 mg total) by mouth daily. Swallow whole. START taking 08/26/2022   multivitamin-prenatal 27-0.8 MG Tabs tablet Take 1 tablet by mouth daily at 12 noon.   sertraline 50 MG tablet Commonly known as: ZOLOFT Take 1 tablet (50 mg total) by mouth daily.         SignedHaroldine Laws, CNM 02/28/2023 7:51 PM

## 2023-02-28 NOTE — OB Triage Note (Signed)
Pt discharged home per order. She is stable and ambulatory. AVS printed and given to pt. All questions answered. Left floor with mother and all belongings.

## 2023-02-28 NOTE — Progress Notes (Signed)
RN called to bedside. Suzanne Nelson requested to be discharged, as she feeling that she would rest better at home. Ulyses Southward, CNM notified and verbal order for discharge obtained.

## 2023-02-28 NOTE — OB Triage Note (Signed)
Patient comes to hospital today with complaint of a pounding headache rating in 7/10.  She also feels that her feet are more swollen than usual and are painful due to edema.  She reports black spots in front of her eyes since midnight last night.  She reports being nauseated but has not vomited.Patient took tylenol 1000mg  at home earlier today but pain did not improve.

## 2023-03-01 NOTE — Progress Notes (Unsigned)
[facility]  PRENATAL VISIT NOTE  Subjective:  Suzanne Nelson is a 27 y.o. [phone] at [redacted]w[redacted]d being seen today for ongoing prenatal care.  She is currently monitored for the following issues for this low-risk pregnancy and has Major depressive disorder, single episode, moderate (HCC); Attention deficit hyperactivity disorder, combined type; Oppositional defiant disorder; History of spontaneous abortion x 2 in the last year; Supervision of other normal pregnancy, antepartum; Obesity during pregnancy, antepartum; Smoking (tobacco) complicating pregnancy, unspecified trimester; Migraine without aura, with intractable migraine, so stated, with status migrainosus; 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; +UDS MJ 09/10/22; Shingles in pregnancy diagnosed 09/02/22 [facility] ER; UTI (urinary tract infection) during pregnancy dx'd [facility] ER 09/12/22; Abnormal glucose tolerance test in pregnancy 12/16/22 1 hour glucola=140; Pain and swelling of right lower leg; Positive GBS test; and Migraine on their problem list.  Patient reports {sx:14538}.   .  .   . ***Denies leaking of fluid/ROM.   The following portions of the patient's history were reviewed and updated as appropriate: allergies, current medications, past family history, past medical history, past social history, past surgical history and problem list. Problem list updated.  Objective:  There were no vitals filed for this visit.  Fetal Status:           General:  Alert, oriented and cooperative. Patient is in no acute distress.  Skin: Skin is warm and dry. No rash noted.   Cardiovascular: Normal heart rate noted  Respiratory: Normal respiratory effort, no problems with respiration noted  Abdomen: Soft, gravid, appropriate for gestational age.        Pelvic: {Blank single:19197::"Cervical exam performed","Cervical exam deferred"}        Extremities: Normal range of motion.     Mental Status: Normal mood and affect. Normal  behavior. Normal judgment and thought content.   Assessment and Plan:  Pregnancy: G4P1021 at [redacted]w[redacted]d  1. Supervision of other normal pregnancy, antepartum -taking PNV daily   2. Obesity during pregnancy, antepartum -weight -exercise -taking ASA daily   3. 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; +UDS MJ 09/10/22 -reports last gummy in August- discussed ways to maintain abstinence   4. Smoking (tobacco) complicating pregnancy, unspecified trimester -reports she quit smoking about 2 months ago -denies current use -discussed ways to continue abstinence from smoking after delivery  5. Pain and swelling of right lower leg -went to the ER with HA and vision changes- no evidence of pre-eclampsia- all labs wnl, and BP normotensive   Term labor symptoms and general obstetric precautions including but not limited to vaginal bleeding, contractions, leaking of fluid and fetal movement were reviewed in detail with the patient. Please refer to After Visit Summary for other counseling recommendations.  No follow-ups on file.  Future Appointments  Date Time Provider Department Center  03/02/2023 10:30 AM [facility] PROVIDER [facility] None    Lenice Llamas, FNP

## 2023-03-02 ENCOUNTER — Ambulatory Visit: Payer: Medicaid Other | Admitting: Family Medicine

## 2023-03-02 VITALS — BP 107/73 | HR 98 | Temp 96.9°F | Wt 231.0 lb

## 2023-03-02 DIAGNOSIS — O99330 Smoking (tobacco) complicating pregnancy, unspecified trimester: Secondary | ICD-10-CM

## 2023-03-02 DIAGNOSIS — O99333 Smoking (tobacco) complicating pregnancy, third trimester: Secondary | ICD-10-CM

## 2023-03-02 DIAGNOSIS — O99213 Obesity complicating pregnancy, third trimester: Secondary | ICD-10-CM

## 2023-03-02 DIAGNOSIS — M7989 Other specified soft tissue disorders: Secondary | ICD-10-CM

## 2023-03-02 DIAGNOSIS — M79661 Pain in right lower leg: Secondary | ICD-10-CM

## 2023-03-02 DIAGNOSIS — F1290 Cannabis use, unspecified, uncomplicated: Secondary | ICD-10-CM

## 2023-03-02 DIAGNOSIS — Z3480 Encounter for supervision of other normal pregnancy, unspecified trimester: Secondary | ICD-10-CM

## 2023-03-02 DIAGNOSIS — Z3483 Encounter for supervision of other normal pregnancy, third trimester: Secondary | ICD-10-CM

## 2023-03-02 DIAGNOSIS — O99210 Obesity complicating pregnancy, unspecified trimester: Secondary | ICD-10-CM

## 2023-03-02 NOTE — Progress Notes (Signed)
[facility] IOL referral for 03/17/2023 received from Aliene Altes FNP-C today. Per [facility] requirement, IOL referral can't be faxed until 10 days prior to requested IOL date. Referral and snapshot pages on [facility] nurse to do cart in an outguide with sticky note instruction to fax on 03/07/23. Jossie Ng, RN

## 2023-03-03 NOTE — Telephone Encounter (Signed)
Pts mother called with patient also on the line to speak with provider about dilation and efacment is wrong compared to hospital and would like a call back.

## 2023-03-03 NOTE — Telephone Encounter (Signed)
Returned call to patient. Patient confused about dilation and effacement measurements from hospital visits and clinic visit. Patient concerned that her dilation was "going backward". Patient counseled that measurement of dilation is subjective and people may measure slightly differently, but that her dilation will progress and not go "backward". Patient counseled that she may dilate more before labor or not until in labor and either is ok, no need to worry. Patient states she was just confused and feels much better. Patient has another revisit on 03/09/23. Patient counseled that IOL will be scheduled, but she may have the baby before that and we can cancel that appointment. Patient states understanding and denies further questions.Burt Knack

## 2023-03-08 NOTE — Progress Notes (Unsigned)
[facility]  PRENATAL VISIT NOTE  Subjective:  Suzanne Nelson is a 27 y.o. [phone] at [redacted]w[redacted]d being seen today for ongoing prenatal care.  She is currently monitored for the following issues for this low-risk pregnancy and has Major depressive disorder, single episode, moderate (HCC); Attention deficit hyperactivity disorder, combined type; Oppositional defiant disorder; History of spontaneous abortion x 2 in the last year; Supervision of other normal pregnancy, antepartum; Obesity during pregnancy, antepartum; Smoking (tobacco) complicating pregnancy, unspecified trimester; Migraine without aura, with intractable migraine, so stated, with status migrainosus; 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; +UDS MJ 09/10/22; Shingles in pregnancy diagnosed 09/02/22 [facility] ER; UTI (urinary tract infection) during pregnancy dx'd [facility] ER 09/12/22; Abnormal glucose tolerance test in pregnancy 12/16/22 1 hour glucola=140; Pain and swelling of right lower leg; Positive GBS test; and Migraine on their problem list.  Patient reports {sx:14538}.   .  .   . Denies leaking of fluid/ROM.   The following portions of the patient's history were reviewed and updated as appropriate: allergies, current medications, past family history, past medical history, past social history, past surgical history and problem list. Problem list updated.  Objective:  There were no vitals filed for this visit.  Fetal Status:           General:  Alert, oriented and cooperative. Patient is in no acute distress.  Skin: Skin is warm and dry. No rash noted.   Cardiovascular: Normal heart rate noted  Respiratory: Normal respiratory effort, no problems with respiration noted  Abdomen: Soft, gravid, appropriate for gestational age.        Pelvic: {Blank single:19197::"Cervical exam performed","Cervical exam deferred"}        Extremities: Normal range of motion.     Mental Status: Normal mood and affect. Normal  behavior. Normal judgment and thought content.   Assessment and Plan:  Pregnancy: [id number] at [redacted]w[redacted]d  1. Supervision of other normal pregnancy, antepartum -cervix check performed at last appointment   2. Obesity during pregnancy, antepartum -taking ASA daily  3. Major depressive disorder, single episode, moderate (HCC) ***  4. Pain and swelling of right lower leg -BP? -HA, scotoma, RUQ pain?  5. [redacted] weeks gestation of pregnancy ***   {Blank single:19197::"Term","Preterm"} labor symptoms and general obstetric precautions including but not limited to vaginal bleeding, contractions, leaking of fluid and fetal movement were reviewed in detail with the patient. Please refer to After Visit Summary for other counseling recommendations.  No follow-ups on file.  Future Appointments  Date Time Provider Department Center  03/09/2023  8:20 AM [facility] PROVIDER [facility] None    Lenice Llamas, FNP

## 2023-03-09 ENCOUNTER — Ambulatory Visit: Payer: Medicaid Other | Admitting: *Deleted

## 2023-03-09 ENCOUNTER — Telehealth: Payer: Self-pay

## 2023-03-09 ENCOUNTER — Ambulatory Visit: Payer: Medicaid Other | Admitting: Family Medicine

## 2023-03-09 ENCOUNTER — Encounter: Payer: Self-pay | Admitting: Advanced Practice Midwife

## 2023-03-09 ENCOUNTER — Ambulatory Visit: Payer: Medicaid Other | Attending: Advanced Practice Midwife

## 2023-03-09 VITALS — BP 106/70 | HR 112 | Temp 97.8°F | Wt 232.4 lb

## 2023-03-09 VITALS — BP 134/75 | HR 93

## 2023-03-09 DIAGNOSIS — O99810 Abnormal glucose complicating pregnancy: Secondary | ICD-10-CM

## 2023-03-09 DIAGNOSIS — B029 Zoster without complications: Secondary | ICD-10-CM

## 2023-03-09 DIAGNOSIS — B951 Streptococcus, group B, as the cause of diseases classified elsewhere: Secondary | ICD-10-CM | POA: Diagnosis not present

## 2023-03-09 DIAGNOSIS — O99210 Obesity complicating pregnancy, unspecified trimester: Secondary | ICD-10-CM | POA: Diagnosis present

## 2023-03-09 DIAGNOSIS — O99323 Drug use complicating pregnancy, third trimester: Secondary | ICD-10-CM | POA: Diagnosis not present

## 2023-03-09 DIAGNOSIS — F321 Major depressive disorder, single episode, moderate: Secondary | ICD-10-CM

## 2023-03-09 DIAGNOSIS — M79661 Pain in right lower leg: Secondary | ICD-10-CM

## 2023-03-09 DIAGNOSIS — O99333 Smoking (tobacco) complicating pregnancy, third trimester: Secondary | ICD-10-CM

## 2023-03-09 DIAGNOSIS — O3663X0 Maternal care for excessive fetal growth, third trimester, not applicable or unspecified: Secondary | ICD-10-CM | POA: Insufficient documentation

## 2023-03-09 DIAGNOSIS — Z3A39 39 weeks gestation of pregnancy: Secondary | ICD-10-CM | POA: Diagnosis not present

## 2023-03-09 DIAGNOSIS — O99213 Obesity complicating pregnancy, third trimester: Secondary | ICD-10-CM | POA: Insufficient documentation

## 2023-03-09 DIAGNOSIS — F17210 Nicotine dependence, cigarettes, uncomplicated: Secondary | ICD-10-CM

## 2023-03-09 DIAGNOSIS — Z3480 Encounter for supervision of other normal pregnancy, unspecified trimester: Secondary | ICD-10-CM | POA: Diagnosis present

## 2023-03-09 DIAGNOSIS — M7989 Other specified soft tissue disorders: Secondary | ICD-10-CM

## 2023-03-09 DIAGNOSIS — Z3483 Encounter for supervision of other normal pregnancy, third trimester: Secondary | ICD-10-CM

## 2023-03-09 DIAGNOSIS — F1290 Cannabis use, unspecified, uncomplicated: Secondary | ICD-10-CM | POA: Diagnosis not present

## 2023-03-09 DIAGNOSIS — O99013 Anemia complicating pregnancy, third trimester: Secondary | ICD-10-CM | POA: Insufficient documentation

## 2023-03-09 MED ORDER — IRON (FERROUS SULFATE) 325 (65 FE) MG PO TABS: 1.0000 | ORAL_TABLET | Freq: Every day | ORAL | Status: AC

## 2023-03-09 NOTE — Telephone Encounter (Signed)
EGA = 39 6/7 today. At approximately 4:45 pm, returned call to client who voices concerns over when she will be delivered and due to size of baby wants a C-section. Voicing concerns of needing to know date of delivery ASAP due to transportation problems. Kept 1515 MFM [place] appt today. Following consult with Hazle Coca CNM after reading her note, client counseled that Dr. Jean Rosenthal is aware of her [place] report today and will be the MD deciding on method of delivery and delivery date. Client counseled as soon as [facility] knows the plan, we will notify her and that [facility] may notify her directly. Counseled unlikely that she will know plan tonight due to time. Jossie Ng, RN

## 2023-03-09 NOTE — Progress Notes (Signed)
IOL date from [facility] is pending (referral faxed with confirmation received 03/06/23). 02/28/23 hgb at [facility] L & D evaluation = 9.3. Per Aliene Altes FNP-C, initiate ferrous sulfate 325mg  (1 tablet) po daily. Counseled on how to take medicine and questions answered. 1 week [facility] RV appt scheduled. Jossie Ng, RN

## 2023-03-09 NOTE — Addendum Note (Signed)
Addended by: Arnetha Courser on: 03/09/2023 11:57 AM   Modules accepted: Orders

## 2023-03-09 NOTE — Telephone Encounter (Signed)
patient is trying to get in touch with provider because she was sent to ED and wants a call back as soon as they can.

## 2023-03-09 NOTE — Telephone Encounter (Signed)
Aliene Altes FNP-C ordered MFM growth [place] this am on paper referral. Requested E. Sciora CNM order via [employer] as Ms. Sydnee Levans now out of agency. Call to MFM scheduler and spoke with [facility] regarding need for appt ASAP due to EGA = 39 6/7 and fundal height this am = 44. Per Kelby Aline, no available appts at [facility] location and 03/23/23 first available appt at [facility] location. Per Kelby Aline, she will speak with techs to determine if [place] can be scheduled earlier. Return call from [place] and [place] scheduled for 1515 in [place] this pm. RN then called client with [place] appt and she states will have to contact mother to see if her friend will loan them her car. Client called back ~ 10 minutes later stating she can go to [place] appt this pm. [facility] facility address given to client. Jossie Ng, RN

## 2023-03-10 ENCOUNTER — Telehealth: Payer: Self-pay

## 2023-03-10 MED ORDER — SOD CITRATE-CITRIC ACID 500-334 MG/5ML PO SOLN
30.0000 mL | ORAL | Status: AC
  Administered 2023-03-11: 30 mL via ORAL

## 2023-03-10 MED ORDER — CEFAZOLIN SODIUM-DEXTROSE 2-4 GM/100ML-% IV SOLN
2.0000 g | INTRAVENOUS | Status: AC
  Administered 2023-03-11: 2 g via INTRAVENOUS
  Administered 2023-03-11: 1 g via INTRAVENOUS
  Filled 2023-03-10: qty 100

## 2023-03-10 NOTE — H&P (Signed)
OB/GYN Labor and Delivery H&P Name: Suzanne Nelson Nov 07, 1995 Date: 03/11/2023   Time: 12:49 PM    CC: scheduled c/s   HPI: Suzanne Nelson is a 27 y.o. [id number] at [redacted]w[redacted]d by L/8 who presents to clinic for scheduled elective cesarean section for fetal macrosomia.   Surgical history: none Placenta: anterior   Denies VB, contractions, leakage of fluid. Reports good fetal movement.   Her pregnancy is complicated by: Obesity Fetal macrosomia Depression Anemia Tobacco use UTI THC use Migraines Shingles in pregnancy GBS positive Abnormal 1 hr GTT, normal 3 hr   ROS: Negative except that noted in HPI.     PRENATAL CARE PROVIDER: [facility]   OBHx:    OB History  Gravida Para Term Preterm AB Living  4 1 1   2 1   SAB IAB Ectopic Multiple Live Births  2     0 1    # Outcome Date GA Lbr Len/2nd Weight Sex Type Anes PTL Lv  4 Current           3 SAB 05/02/22 [redacted]w[redacted]d         2 SAB 01/01/22 [redacted]w[redacted]d         1 Term 05/18/20 [redacted]w[redacted]d / 01:52 3520 g F Vag-Spont EPI  LIV     PROBLEM LIST:  Patient Active Problem List   Diagnosis Date Noted   [redacted] weeks gestation of pregnancy 03/11/2023   Anemia, antepartum, third trimester 03/09/2023   Migraine 02/28/2023   Positive GBS test 02/17/2023   Pain and swelling of right lower leg 02/14/2023   Abnormal glucose tolerance test in pregnancy 12/16/22 1 hour glucola=140 12/17/2022   UTI (urinary tract infection) during pregnancy dx'd [facility] ER 09/12/22 09/15/2022   Shingles in pregnancy diagnosed 09/02/22 [facility] ER 09/06/2022   Supervision of other normal pregnancy, antepartum 08/17/2022   Obesity during pregnancy, antepartum 08/17/2022   Smoking (tobacco) complicating pregnancy, unspecified trimester 08/17/2022   Migraine without aura, with intractable migraine, so stated, with status migrainosus 08/17/2022   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; +UDS MJ 09/10/22 08/17/2022   History of spontaneous abortion x 2 in the last year 07/21/2022   Major depressive disorder,  single episode, moderate (HCC) 03/16/2011   Attention deficit hyperactivity disorder, combined type 03/16/2011   Oppositional defiant disorder 03/16/2011     PMHx:    Past Medical History:  Diagnosis Date   ADHD (attention deficit hyperactivity disorder)    Anemia    Anxiety    Asthma    Asthma    BV (bacterial vaginosis) 10/19/2019   Noted after NOB. Rx for Metrogel   Calculus of distal left ureter 05/18/2015   Depression    Eating disorder 08/17/2022   Encounter for care or examination of lactating mother 05/18/2020   History of eating disorder 08/17/2022   Hydronephrosis 05/18/2015   Left ureteral stone 05/18/2015   Migraine    No pertinent past medical history    Obesity during pregnancy, antepartum 08/17/2022   Panic attack    Pneumonia    Pyelonephritis affecting pregnancy in third trimester    Spotting affecting pregnancy in third trimester 01/22/2023   Subchorionic hemorrhage in first trimester 08/02/2022   Seen on 4/11 [place]       PSHx:    Past Surgical History:  Procedure Laterality Date   CYSTO N/A 05/19/2015   Procedure: CYSTO;  Surgeon: Vanna Scotland, MD;  Location: [facility] ORS;  Service: Urology;  Laterality: N/A;   STENT PLACE LEFT URETER (  [facility] HX)     URETEROSCOPY WITH HOLMIUM LASER LITHOTRIPSY Left 05/19/2015   Procedure: URETEROSCOPY WITH HOLMIUM LASER LITHOTRIPSY;  Surgeon: Vanna Scotland, MD;  Location: [facility] ORS;  Service: Urology;  Laterality: Left;     MEDS:    No current facility-administered medications on file prior to encounter.   Current Outpatient Medications on File Prior to Encounter  Medication Sig Dispense Refill   acetaminophen (TYLENOL) 500 MG tablet Take 500-1,000 mg by mouth every 6 (six) hours as needed (pain.).     aspirin EC 81 MG tablet Take 1 tablet (81 mg total) by mouth daily. Swallow whole. START taking 08/26/2022 30 tablet 8   Iron, Ferrous Sulfate, 325 (65 Fe) MG TABS Take 1 tablet by mouth daily at 6 (six) AM. (Patient taking  differently: Take 1 tablet by mouth at bedtime.)     Prenatal Vit-Fe Fumarate-FA (MULTIVITAMIN-PRENATAL) 27-0.8 MG TABS tablet Take 1 tablet by mouth in the morning.     sertraline (ZOLOFT) 50 MG tablet Take 1 tablet (50 mg total) by mouth daily. (Patient taking differently: Take 50 mg by mouth at bedtime.) 30 tablet 1   [DISCONTINUED] albuterol (PROVENTIL HFA;VENTOLIN HFA) 108 (90 Base) MCG/ACT inhaler Inhale 2 puffs into the lungs every 6 (six) hours as needed for wheezing or shortness of breath. 1 Inhaler 0     ALLERGIES:    Allergies  Allergen Reactions   Onion Swelling   Coconut (Cocos Nucifera) Itching   Adhesive [Tape] Other (See Comments)    Steri-Strips & Silk tape   Tape Rash    Reaction to Paper tape. Pt denies reaction to tegaderm or surgical tape.     FAMILY Hx:    Family History  Problem Relation Age of Onset   Depression Mother    Diabetes Mellitus II Mother        Pre Diabetes   Congestive Heart Failure Mother    Depression Father    Hypertension Father    Breast cancer Paternal Grandmother        not sure of age     SOCIAL Hx:    Social History   Tobacco Use   Smoking status: Former    Current packs/day: 0.00    Types: Cigarettes    Quit date: 04/07/2022    Years since quitting: 0.9   Smokeless tobacco: Never   Tobacco comments:    quiting now  Substance Use Topics   Alcohol use: Not Currently    Comment: socially; drank 3 glasses wine 07/03/22 and states did not know she was pregnant     OBJECTIVE:    Vitals: BP 116/67 (BP Location: Right Arm)   Pulse 83   Temp 97.9 F (36.6 C) (Oral)   Resp 18   Ht 5\' 2"  (1.575 m)   Wt 105.2 kg   LMP 06/03/2022 (Exact Date)   BMI 42.43 kg/m  Body mass index is 42.43 kg/m.   Physical exam:  Gen-  Alert and interactive, NAD.  CV-   Regular rate PULM-   Respirations unlabored on room air ABD-   Gravid, nontender Ext-   Trace LE edema   03/09/23 EFW: 4689g (>99%), AC >99%    Prenatal Labs:     ABO/RH(D)  Date Value Ref Range Status  03/11/2023 O POS  Final   Hepatitis B Surface Ag  Date Value Ref Range Status  08/17/2022 Negative Negative Final                LAB  RESULTS    Genetics NIPS: Low risk   AFP: Neg Fetal EXB:MWUXLK            A1C/GTT Early GCT:        28w GMW:NUUVOZ 1hr   Three hour GTT: Passed  Blood Type O/Positive/-- (04/30 1459)  Antibody Negative (04/30 1459)  Rubella 2.03 (04/30 1459)  RPR Non Reactive (08/29 1237)  HBsAg Negative (04/30 1459)  HIV Non Reactive (08/29 1237)  HCVAb Non Reactive (04/30 1459)     Flu: declined Tdap: received 12/16/22 RSV: received 02/07/23   ASSESSMENT/PLAN:     Suzanne Nelson is a 27 y.o. [id number] at [redacted]w[redacted]d by -/8 who presents for scheduled elective c/s for fetal macrosomia.   #Scheduled c/s - Admit to L&D for scheduled c/s - Routine admission labs.  - Rh+ - Breastfeeding - Contraception: OCPs - Discussed and consented the patient for a cesarean section. Risks such as infection, bleeding, organ damage, anesthesia complications, and need for possible blood products were discussed. Patient will accept products in case of an emergency. We discussed the risks of a blood transfusion, such as a 1 in 1.2-1.4 million chance of contracting HIV, Hep C.   #Depression: Continue Zoloft 50 mg  #Hx THC use: UDS ordered    Romana Juniper, MD 03/11/2023 12:49 PM

## 2023-03-10 NOTE — Telephone Encounter (Signed)
Per Hazle Coca CNM, message received from Dr. Jean Rosenthal stating C-section scheduled for Friday. Call made to [facility] and verified client scheduled for C-section on 03/11/23 at 1300. Client needs to arrive by 1100 per L & D staff. Call to client with above information. Verified client has transportation for appt and she stated yes. Jossie Ng, RN

## 2023-03-11 ENCOUNTER — Inpatient Hospital Stay
Admission: RE | Admit: 2023-03-11 | Discharge: 2023-03-13 | DRG: 787 | Disposition: A | Discharge status: Home / Self Care | Payer: Medicaid Other | Attending: Obstetrics | Admitting: Obstetrics

## 2023-03-11 ENCOUNTER — Encounter
Admission: RE | Disposition: A | Discharge status: Home / Self Care | Payer: Self-pay | Source: Home / Self Care | Attending: Obstetrics

## 2023-03-11 ENCOUNTER — Inpatient Hospital Stay: Payer: Medicaid Other | Admitting: Anesthesiology

## 2023-03-11 ENCOUNTER — Encounter: Payer: Self-pay | Admitting: Obstetrics and Gynecology

## 2023-03-11 ENCOUNTER — Other Ambulatory Visit: Payer: Self-pay

## 2023-03-11 ENCOUNTER — Other Ambulatory Visit
Admission: RE | Admit: 2023-03-11 | Discharge: 2023-03-11 | Disposition: A | Discharge status: Home / Self Care | Payer: Medicaid Other | Source: Home / Self Care | Attending: Obstetrics and Gynecology | Admitting: Obstetrics and Gynecology

## 2023-03-11 DIAGNOSIS — Z8249 Family history of ischemic heart disease and other diseases of the circulatory system: Secondary | ICD-10-CM

## 2023-03-11 DIAGNOSIS — Z3480 Encounter for supervision of other normal pregnancy, unspecified trimester: Principal | ICD-10-CM

## 2023-03-11 DIAGNOSIS — O99324 Drug use complicating childbirth: Secondary | ICD-10-CM | POA: Diagnosis present

## 2023-03-11 DIAGNOSIS — O480 Post-term pregnancy: Secondary | ICD-10-CM | POA: Diagnosis present

## 2023-03-11 DIAGNOSIS — O99810 Abnormal glucose complicating pregnancy: Secondary | ICD-10-CM

## 2023-03-11 DIAGNOSIS — F32A Depression, unspecified: Secondary | ICD-10-CM | POA: Diagnosis present

## 2023-03-11 DIAGNOSIS — D62 Acute posthemorrhagic anemia: Secondary | ICD-10-CM | POA: Diagnosis not present

## 2023-03-11 DIAGNOSIS — F1290 Cannabis use, unspecified, uncomplicated: Secondary | ICD-10-CM | POA: Diagnosis present

## 2023-03-11 DIAGNOSIS — Z818 Family history of other mental and behavioral disorders: Secondary | ICD-10-CM

## 2023-03-11 DIAGNOSIS — O9081 Anemia of the puerperium: Secondary | ICD-10-CM | POA: Diagnosis not present

## 2023-03-11 DIAGNOSIS — O99824 Streptococcus B carrier state complicating childbirth: Secondary | ICD-10-CM | POA: Diagnosis present

## 2023-03-11 DIAGNOSIS — Z7982 Long term (current) use of aspirin: Secondary | ICD-10-CM | POA: Diagnosis not present

## 2023-03-11 DIAGNOSIS — Z3A40 40 weeks gestation of pregnancy: Secondary | ICD-10-CM

## 2023-03-11 DIAGNOSIS — Z79899 Other long term (current) drug therapy: Secondary | ICD-10-CM

## 2023-03-11 DIAGNOSIS — B029 Zoster without complications: Secondary | ICD-10-CM

## 2023-03-11 DIAGNOSIS — O3663X0 Maternal care for excessive fetal growth, third trimester, not applicable or unspecified: Secondary | ICD-10-CM | POA: Diagnosis present

## 2023-03-11 DIAGNOSIS — Z91048 Other nonmedicinal substance allergy status: Secondary | ICD-10-CM | POA: Diagnosis not present

## 2023-03-11 DIAGNOSIS — O99214 Obesity complicating childbirth: Secondary | ICD-10-CM | POA: Diagnosis present

## 2023-03-11 DIAGNOSIS — O99344 Other mental disorders complicating childbirth: Secondary | ICD-10-CM | POA: Diagnosis present

## 2023-03-11 DIAGNOSIS — B951 Streptococcus, group B, as the cause of diseases classified elsewhere: Secondary | ICD-10-CM

## 2023-03-11 DIAGNOSIS — Z98891 History of uterine scar from previous surgery: Secondary | ICD-10-CM

## 2023-03-11 DIAGNOSIS — Z3483 Encounter for supervision of other normal pregnancy, third trimester: Secondary | ICD-10-CM | POA: Diagnosis not present

## 2023-03-11 DIAGNOSIS — O99210 Obesity complicating pregnancy, unspecified trimester: Secondary | ICD-10-CM

## 2023-03-11 LAB — CBC
HCT: 32 % — ABNORMAL LOW (ref 36.0–46.0)
Hemoglobin: 10.4 g/dL — ABNORMAL LOW (ref 12.0–15.0)
MCH: 24.6 pg — ABNORMAL LOW (ref 26.0–34.0)
MCHC: 32.5 g/dL (ref 30.0–36.0)
MCV: 75.8 fL — ABNORMAL LOW (ref 80.0–100.0)
Platelets: 253 10*3/uL (ref 150–400)
RBC: 4.22 MIL/uL (ref 3.87–5.11)
RDW: 14.9 % (ref 11.5–15.5)
WBC: 15.1 10*3/uL — ABNORMAL HIGH (ref 4.0–10.5)
nRBC: 0 % (ref 0.0–0.2)

## 2023-03-11 LAB — COMPREHENSIVE METABOLIC PANEL
ALT: 14 U/L (ref 0–44)
AST: 20 U/L (ref 15–41)
Albumin: 2.9 g/dL — ABNORMAL LOW (ref 3.5–5.0)
Alkaline Phosphatase: 232 U/L — ABNORMAL HIGH (ref 38–126)
Anion gap: 7 (ref 5–15)
BUN: 7 mg/dL (ref 6–20)
CO2: 21 mmol/L — ABNORMAL LOW (ref 22–32)
Calcium: 8.1 mg/dL — ABNORMAL LOW (ref 8.9–10.3)
Chloride: 105 mmol/L (ref 98–111)
Creatinine, Ser: 0.41 mg/dL — ABNORMAL LOW (ref 0.44–1.00)
GFR, Estimated: >60 mL/min (ref >60)
Glucose, Bld: 77 mg/dL (ref 70–99)
Potassium: 3.8 mmol/L (ref 3.5–5.1)
Sodium: 133 mmol/L — ABNORMAL LOW (ref 135–145)
Total Bilirubin: 0.6 mg/dL (ref <1.2)
Total Protein: 6.9 g/dL (ref 6.5–8.1)

## 2023-03-11 LAB — URINE DRUG SCREEN, QUALITATIVE (ARMC ONLY)
Amphetamines, Ur Screen: NOT DETECTED
Barbiturates, Ur Screen: NOT DETECTED
Benzodiazepine, Ur Scrn: NOT DETECTED
Cannabinoid 50 Ng, Ur ~~LOC~~: NOT DETECTED
Cocaine Metabolite,Ur ~~LOC~~: NOT DETECTED
MDMA (Ecstasy)Ur Screen: NOT DETECTED
Methadone Scn, Ur: NOT DETECTED
Opiate, Ur Screen: NOT DETECTED
Phencyclidine (PCP) Ur S: NOT DETECTED
Tricyclic, Ur Screen: NOT DETECTED

## 2023-03-11 LAB — TYPE AND SCREEN
ABO/RH(D): O POS
Antibody Screen: NEGATIVE

## 2023-03-11 SURGERY — Surgical Case
Anesthesia: Spinal

## 2023-03-11 MED ORDER — MORPHINE SULFATE (PF) 0.5 MG/ML IJ SOLN
INTRAMUSCULAR | Status: AC
  Filled 2023-03-11: qty 10

## 2023-03-11 MED ORDER — PHENYLEPHRINE 80 MCG/ML (10ML) SYRINGE FOR IV PUSH (FOR BLOOD PRESSURE SUPPORT)
PREFILLED_SYRINGE | INTRAVENOUS | Status: AC
  Filled 2023-03-11: qty 10

## 2023-03-11 MED ORDER — SERTRALINE HCL 25 MG PO TABS
50.0000 mg | ORAL_TABLET | Freq: Every day | ORAL | Status: DC
  Administered 2023-03-11 – 2023-03-12 (×2): 50 mg via ORAL
  Filled 2023-03-11: qty 2
  Filled 2023-03-11: qty 1
  Filled 2023-03-11: qty 2

## 2023-03-11 MED ORDER — SOD CITRATE-CITRIC ACID 500-334 MG/5ML PO SOLN
ORAL | Status: AC
  Filled 2023-03-11: qty 15

## 2023-03-11 MED ORDER — CEFAZOLIN SODIUM 1 G IJ SOLR
INTRAMUSCULAR | Status: AC
  Filled 2023-03-11: qty 10

## 2023-03-11 MED ORDER — LACTATED RINGERS IV SOLN: INTRAVENOUS | Status: DC

## 2023-03-11 MED ORDER — ONDANSETRON HCL 4 MG/2ML IJ SOLN
INTRAMUSCULAR | Status: AC
  Filled 2023-03-11: qty 2

## 2023-03-11 MED ORDER — OXYTOCIN-SODIUM CHLORIDE 30-0.9 UT/500ML-% IV SOLN
INTRAVENOUS | Status: AC
  Filled 2023-03-11: qty 500

## 2023-03-11 MED ORDER — MEASLES, MUMPS & RUBELLA VAC IJ SOLR
0.5000 mL | Freq: Once | INTRAMUSCULAR | Status: DC
  Filled 2023-03-11: qty 0.5

## 2023-03-11 MED ORDER — FENTANYL CITRATE (PF) 100 MCG/2ML IJ SOLN
INTRAMUSCULAR | Status: AC
  Filled 2023-03-11: qty 2

## 2023-03-11 MED ORDER — BUPIVACAINE HCL (PF) 0.25 % IJ SOLN
INTRAMUSCULAR | Status: AC
  Filled 2023-03-11: qty 60

## 2023-03-11 MED ORDER — KETOROLAC TROMETHAMINE 30 MG/ML IJ SOLN
INTRAMUSCULAR | Status: AC
  Filled 2023-03-11: qty 1

## 2023-03-11 MED ORDER — POLYETHYLENE GLYCOL 3350 17 G PO PACK
17.0000 g | PACK | Freq: Every day | ORAL | Status: DC
  Administered 2023-03-12 – 2023-03-13 (×2): 17 g via ORAL
  Filled 2023-03-11 (×3): qty 1

## 2023-03-11 MED ORDER — KETOROLAC TROMETHAMINE 30 MG/ML IJ SOLN
30.0000 mg | Freq: Four times a day (QID) | INTRAMUSCULAR | Status: DC | PRN
  Administered 2023-03-12 (×2): 30 mg via INTRAVENOUS
  Filled 2023-03-11 (×2): qty 1

## 2023-03-11 MED ORDER — DIBUCAINE (PERIANAL) 1 % EX OINT: 1.0000 | TOPICAL_OINTMENT | CUTANEOUS | Status: DC | PRN

## 2023-03-11 MED ORDER — PHENYLEPHRINE HCL-NACL 20-0.9 MG/250ML-% IV SOLN
INTRAVENOUS | Status: AC
  Filled 2023-03-11: qty 250

## 2023-03-11 MED ORDER — NALOXONE HCL 0.4 MG/ML IJ SOLN: 0.4000 mg | INTRAMUSCULAR | Status: DC | PRN

## 2023-03-11 MED ORDER — SODIUM CHLORIDE 0.9% FLUSH: 3.0000 mL | INTRAVENOUS | Status: DC | PRN

## 2023-03-11 MED ORDER — DIPHENHYDRAMINE HCL 50 MG/ML IJ SOLN: 12.5000 mg | INTRAMUSCULAR | Status: DC | PRN

## 2023-03-11 MED ORDER — ACETAMINOPHEN 325 MG PO TABS
650.0000 mg | ORAL_TABLET | Freq: Four times a day (QID) | ORAL | Status: DC
  Administered 2023-03-11 – 2023-03-13 (×8): 650 mg via ORAL
  Filled 2023-03-11 (×8): qty 2

## 2023-03-11 MED ORDER — DEXAMETHASONE SODIUM PHOSPHATE 10 MG/ML IJ SOLN
INTRAMUSCULAR | Status: AC
  Filled 2023-03-11: qty 1

## 2023-03-11 MED ORDER — MORPHINE SULFATE (PF) 0.5 MG/ML IJ SOLN
INTRAMUSCULAR | Status: DC | PRN
  Administered 2023-03-11: .1 mg via EPIDURAL

## 2023-03-11 MED ORDER — DIPHENHYDRAMINE HCL 25 MG PO CAPS: 25.0000 mg | ORAL_CAPSULE | Freq: Four times a day (QID) | ORAL | Status: DC | PRN

## 2023-03-11 MED ORDER — SIMETHICONE 80 MG PO CHEW
80.0000 mg | CHEWABLE_TABLET | Freq: Three times a day (TID) | ORAL | Status: DC
  Administered 2023-03-11 – 2023-03-13 (×6): 80 mg via ORAL
  Filled 2023-03-11 (×6): qty 1

## 2023-03-11 MED ORDER — ONDANSETRON HCL 4 MG/2ML IJ SOLN: 4.0000 mg | Freq: Four times a day (QID) | INTRAMUSCULAR | Status: DC | PRN

## 2023-03-11 MED ORDER — SIMETHICONE 80 MG PO CHEW: 80.0000 mg | CHEWABLE_TABLET | ORAL | Status: DC | PRN

## 2023-03-11 MED ORDER — FENTANYL CITRATE (PF) 100 MCG/2ML IJ SOLN
INTRAMUSCULAR | Status: DC | PRN
  Administered 2023-03-11: 15 ug via INTRAVENOUS

## 2023-03-11 MED ORDER — BUPIVACAINE IN DEXTROSE 0.75-8.25 % IT SOLN
INTRATHECAL | Status: DC | PRN
  Administered 2023-03-11: 1.5 mL via INTRATHECAL

## 2023-03-11 MED ORDER — DEXAMETHASONE SODIUM PHOSPHATE 10 MG/ML IJ SOLN
INTRAMUSCULAR | Status: DC | PRN
  Administered 2023-03-11: 10 mg via INTRAVENOUS

## 2023-03-11 MED ORDER — CHLORHEXIDINE GLUCONATE 0.12 % MT SOLN
OROMUCOSAL | Status: AC
  Administered 2023-03-11: 15 mL
  Filled 2023-03-11: qty 15

## 2023-03-11 MED ORDER — NALOXONE HCL 4 MG/10ML IJ SOLN: 1.0000 ug/kg/h | INTRAVENOUS | Status: DC | PRN

## 2023-03-11 MED ORDER — WITCH HAZEL-GLYCERIN EX PADS: 1.0000 | MEDICATED_PAD | CUTANEOUS | Status: DC | PRN

## 2023-03-11 MED ORDER — PRENATAL MULTIVITAMIN CH
1.0000 | ORAL_TABLET | Freq: Every day | ORAL | Status: DC
  Administered 2023-03-12 – 2023-03-13 (×2): 1 via ORAL
  Filled 2023-03-11 (×2): qty 1

## 2023-03-11 MED ORDER — SENNA 8.6 MG PO TABS
1.0000 | ORAL_TABLET | Freq: Every day | ORAL | Status: DC
  Administered 2023-03-12 – 2023-03-13 (×2): 8.6 mg via ORAL
  Filled 2023-03-11 (×3): qty 1

## 2023-03-11 MED ORDER — OXYCODONE HCL 5 MG PO TABS: 5.0000 mg | ORAL_TABLET | Freq: Four times a day (QID) | ORAL | Status: DC | PRN

## 2023-03-11 MED ORDER — KETOROLAC TROMETHAMINE 30 MG/ML IJ SOLN
INTRAMUSCULAR | Status: DC | PRN
  Administered 2023-03-11 (×2): 30 mg via INTRAVENOUS

## 2023-03-11 MED ORDER — ONDANSETRON HCL 4 MG/2ML IJ SOLN: 4.0000 mg | Freq: Three times a day (TID) | INTRAMUSCULAR | Status: DC | PRN

## 2023-03-11 MED ORDER — ONDANSETRON HCL 4 MG/2ML IJ SOLN
INTRAMUSCULAR | Status: DC | PRN
  Administered 2023-03-11: 4 mg via INTRAVENOUS

## 2023-03-11 MED ORDER — PHENYLEPHRINE 80 MCG/ML (10ML) SYRINGE FOR IV PUSH (FOR BLOOD PRESSURE SUPPORT)
PREFILLED_SYRINGE | INTRAVENOUS | Status: DC | PRN
  Administered 2023-03-11: 80 ug via INTRAVENOUS

## 2023-03-11 MED ORDER — OXYTOCIN-SODIUM CHLORIDE 30-0.9 UT/500ML-% IV SOLN
INTRAVENOUS | Status: DC | PRN
  Administered 2023-03-11: 600 m[IU]/min via INTRAVENOUS

## 2023-03-11 MED ORDER — OXYTOCIN-SODIUM CHLORIDE 30-0.9 UT/500ML-% IV SOLN
2.5000 [IU]/h | INTRAVENOUS | Status: DC
  Administered 2023-03-12: 2.5 [IU]/h via INTRAVENOUS
  Filled 2023-03-11: qty 500

## 2023-03-11 MED ORDER — OXYCODONE HCL 5 MG PO TABS
5.0000 mg | ORAL_TABLET | ORAL | Status: DC | PRN
  Administered 2023-03-12 (×2): 5 mg via ORAL
  Filled 2023-03-11 (×2): qty 1

## 2023-03-11 MED ORDER — COCONUT OIL OIL: 1.0000 | TOPICAL_OIL | Status: DC | PRN

## 2023-03-11 MED ORDER — KETOROLAC TROMETHAMINE 30 MG/ML IJ SOLN
30.0000 mg | Freq: Four times a day (QID) | INTRAMUSCULAR | Status: DC | PRN
Start: 2023-03-11 — End: 2023-03-12

## 2023-03-11 MED ORDER — TETANUS-DIPHTH-ACELL PERTUSSIS 5-2.5-18.5 LF-MCG/0.5 IM SUSY
0.5000 mL | PREFILLED_SYRINGE | Freq: Once | INTRAMUSCULAR | Status: DC
  Filled 2023-03-11: qty 0.5

## 2023-03-11 MED ORDER — PHENYLEPHRINE HCL-NACL 20-0.9 MG/250ML-% IV SOLN
INTRAVENOUS | Status: DC | PRN
  Administered 2023-03-11: 50 ug/min via INTRAVENOUS

## 2023-03-11 MED ORDER — DIPHENHYDRAMINE HCL 25 MG PO CAPS
25.0000 mg | ORAL_CAPSULE | ORAL | Status: DC | PRN
  Administered 2023-03-11: 25 mg via ORAL
  Filled 2023-03-11: qty 1

## 2023-03-11 MED ORDER — MENTHOL 3 MG MT LOZG
1.0000 | LOZENGE | OROMUCOSAL | Status: DC | PRN
Start: 2023-03-11 — End: 2023-03-13

## 2023-03-11 MED ORDER — IBUPROFEN 600 MG PO TABS
600.0000 mg | ORAL_TABLET | Freq: Four times a day (QID) | ORAL | Status: DC
  Administered 2023-03-11 – 2023-03-13 (×6): 600 mg via ORAL
  Filled 2023-03-11 (×6): qty 1

## 2023-03-11 SURGICAL SUPPLY — 4 items
DERMABOND ADVANCED .7 DNX12 (GAUZE/BANDAGES/DRESSINGS) IMPLANT
DRSG OPSITE POSTOP 4X10 (GAUZE/BANDAGES/DRESSINGS) IMPLANT
SUT MON AB 3-0 SH 27 (SUTURE) IMPLANT
SUT VIC AB 0 CTX36XBRD ANBCTRL (SUTURE) IMPLANT

## 2023-03-11 NOTE — Op Note (Signed)
Cesarean Section Operative Note   Name: Suzanne Nelson MRN: [id number] Date of surgery: 03/11/2023   Pre-operative diagnosis: IUP at [redacted]w[redacted]d Fetal macrosomia Migraines Shingles in pregnancy Obesity Anemia Tobacco use in pregnancy THC use in pregnancy UTI in pregnancy GBS positive Depression   Post-operative diagnosis: Same s/p PLTCS   Procedure: Primary low-transverse cesarean section   Surgeon: Kathalene Frames, MD  Assistant: Chari Manning, CNM. No other capable assistant available, in surgery requiring high level assistant.   Anesthesia: Spinal  Indication: Fetal macrosomia. Patient was counseled and elected for an elective cesarean section for fetal macrosomia.    Blood loss:  550cc Fluids: 500cc UOP: 75cc  TOLAC candidate: Patient is a TOLAC candidate.   Complications: none Specimens: None   Findings: No fascial adhesions. No intra-abdominal adhesions. Normal uterus, fallopian tubes, bilateral ovaries.    Procedure: The patient was evaluated and consented prior to surgery. The risks, benefits, complications, treatment options, and expected outcomes were discussed with the patient. The patient concurred with the proposed plan, giving informed consent. The site of surgery was properly noted.    The patient was taken to Operating Room [facility] OR 02, identified as Suzanne Nelson and the procedure verified as a C-Section Delivery.  A Time Out was held and the above information confirmed. Antibiotics were given: See MAR. Sequential compression devices were placed.    After induction of anesthesia, the patient was placed in a dorsal supine position with a left lateral tilt. A foley catheter was placed, and the patient was prepped and draped in the usual sterile manner. After confirming an adequate level of anesthesia, a pfannenstiel skin incision was made with a scalpel and Bovie cautery was used to carry down the incision through the subcutaneous tissue to the fascia.  A fascial incision was made and extended transversely. The fascia was dissected off the rectus muscles superiorly and inferiorly. The peritoneum was identified and entered superiorly with blunt dissection.  The peritoneal incision was extended superiorly and inferiorly with good visualization of the bladder. A bladder blade was placed. A low transverse uterine incision was made and bag of water was broken (clear). The viable female infant was delivered atraumatically without difficulty. The umbilical cord was clamped and cut after 60 seconds, and the infant was then handed to the neonatal team. The placenta was removed with fundal massage and manual extraction. It was noted to be intact; disposed. Given O+ blood type, cord blood was collected. The uterine outline, tubes, and ovaries appeared normal. The uterus was exteriorized and cleared of all clots and debris. The uterine incision was closed with running locked zero Vicryl suture. An expanding hematoma was noted in the left broad ligament; an O'Leary stitch was used to prevent continued bleeding. The hematoma was noted to be stable in size. Bleeding was noted from the incision so a double layer closure technique was used. Continued oozing was noted from the left aspect of the incision, so a figure of eight was used to obtain hemostasis. Hemostasis was observed. The uterus was replaced in the abdomen. The cul-de-sac and peritoneal cavity were irrigated and cleared of all clots and debris. Again hemostasis was noted. The peritoneum was reapproximated with a running suture of 3-0 Monocryl.  The fascia was then reapproximated with running sutures of #1PDS. Irrigation of the subcutaneous tissue was done with normal saline and hemostasis was obtained. 60cc of 0.5% bupivacaine was mixed with 20cc of NS. The solution was injected into the fascia, subcutaneous tissue, and  skin for local anesthesia.  Subcutaneous tissue was reapproximated in a running fashion using 3-0  Monocryl suture. Finally, the skin was reapproximated with 4-0 monocryl.  Dermabond was applied and a honeycomb dressing was then placed. Patient was left in stable condition and taken to [facility] PACU.   The instrument, sponge, and needle counts were correct prior to the abdominal closure and at the conclusion of the case.    Information for the patient's newborn:  Samanthe, Remlinger [[id number]]  Live born female  Birth Weight: 9 lb 4.2 oz (4200 g) APGAR: 8, 9  Newborn Delivery   Birth date/time: 03/11/2023 13:30:00 Delivery type: C-Section, Low Transverse Trial of labor: No C-section categorization: Primary    Maternal condition: stable, to postpartum Newborn condition: stable, remains with mother

## 2023-03-11 NOTE — Anesthesia Preprocedure Evaluation (Signed)
Anesthesia Evaluation  Patient identified by MRN, date of birth, ID band Patient awake    Reviewed: Allergy & Precautions, H&P , NPO status , Patient's Chart, lab work & pertinent test results, reviewed documented beta blocker date and time   History of Anesthesia Complications Negative for: history of anesthetic complications  Airway Mallampati: II  TM Distance: >3 FB Neck ROM: full    Dental  (+) Teeth Intact, Dental Advidsory Given   Pulmonary neg shortness of breath, asthma , neg sleep apnea, neg COPD, neg recent URI, former smoker   Pulmonary exam normal breath sounds clear to auscultation       Cardiovascular Exercise Tolerance: Good negative cardio ROS Normal cardiovascular exam Rhythm:regular Rate:Normal     Neuro/Psych  Headaches, neg Seizures PSYCHIATRIC DISORDERS (Depression and anxiety) Anxiety Depression       GI/Hepatic Neg liver ROS,GERD  Poorly Controlled,,  Endo/Other  neg diabetes  Class 4 obesity  Renal/GU Renal disease (kidney stones)  negative genitourinary   Musculoskeletal   Abdominal   Peds  Hematology  (+) Blood dyscrasia, anemia   Anesthesia Other Findings Past Medical History:   Asthma                                                       Anxiety                                                      Depression                                                   Reproductive/Obstetrics (+) Pregnancy                             Anesthesia Physical Anesthesia Plan  ASA: 3  Anesthesia Plan: Spinal   Post-op Pain Management:    Induction:   PONV Risk Score and Plan: 2  Airway Management Planned: Natural Airway and Nasal Cannula  Additional Equipment:   Intra-op Plan:   Post-operative Plan:   Informed Consent: I have reviewed the patients History and Physical, chart, labs and discussed the procedure including the risks, benefits and alternatives for the  proposed anesthesia with the patient or authorized representative who has indicated his/her understanding and acceptance.     Dental Advisory Given  Plan Discussed with: Anesthesiologist, CRNA and Surgeon  Anesthesia Plan Comments:         Anesthesia Quick Evaluation

## 2023-03-11 NOTE — Lactation Note (Signed)
This note was copied from a baby's chart. Lactation Consultation Note  Patient Name: Suzanne Nelson [id number] Date: 03/11/2023 Age:28 hours Reason for consult: L&D Initial assessment   Maternal Data Has patient been taught Hand Expression?: Yes Does the patient have breastfeeding experience prior to this delivery?: Yes How long did the patient breastfeed?: attempted in hospital with now 64 yrs old, baby had tongue tie  Feeding Mother's Current Feeding Choice: Breast Milk and Formula Mom states baby latched to right breast easily and nursed well x approx10-15 min, now rooting, after hand expression on left, assisted mom with latching baby to left breast in cradle hold, baby would latch, suck a few times and then come off, baby can extend tongue over gum line, but frenulum may be tight, will observe, baby tends to pull in lower lip at this attempt LATCH Score Latch: Repeated attempts needed to sustain latch, nipple held in mouth throughout feeding, stimulation needed to elicit sucking reflex.  Audible Swallowing: None  Type of Nipple: Everted at rest and after stimulation  Comfort (Breast/Nipple): Soft / non-tender  Hold (Positioning): Assistance needed to correctly position infant at breast and maintain latch.  LATCH Score: 6   Lactation Tools Discussed/Used    Interventions Interventions: Breast feeding basics reviewed;Assisted with latch;Skin to skin;Hand express;Adjust position;Education  Discharge    Consult Status Consult Status: Follow-up from L&D Date: 03/12/23 Follow-up type: In-patient    Dyann Kief 03/11/2023, 3:52 PM

## 2023-03-11 NOTE — Discharge Summary (Shared)
Postpartum Discharge Summary  Patient Name: Suzanne Nelson DOB: 1995/12/05 MRN: [id number]  Date of admission: 03/11/2023 Delivery date:03/11/2023 Delivering provider: Kathalene Frames V Date of discharge: 03/13/2023  Primary OB: [facility] [id number] last menstrual period was 06/03/2022 (exact date). EDC Estimated Date of Delivery: 03/10/23 Gestational Age at Delivery: [redacted]w[redacted]d   Admitting diagnosis: [redacted] weeks gestation of pregnancy [Z3A.40] S/P repeat low transverse C-section [Z98.891] Intrauterine pregnancy: [redacted]w[redacted]d     Secondary diagnosis:   Principal Problem:   [redacted] weeks gestation of pregnancy Active Problems:   S/P repeat low transverse C-section   Discharge Diagnosis: Term Pregnancy Delivered                                                Post partum procedures: IV iron Complications: None Delivery Type: primary cesarean section, low transverse incision Anesthesia: spinal anesthesia Placenta: spontaneous To Pathology: No   Prenatal Labs:                LAB RESULTS     Genetics NIPS: Low risk   AFP: Neg Fetal ZDG:[id number]            A1C/GTT Early GCT:        28w VQQ:VZDGLO 1hr   Three hour GTT: Passed  Blood Type O/Positive/-- (04/30 1459)  Antibody Negative (04/30 1459)  Rubella 2.03 (04/30 1459)  RPR Non Reactive (08/29 1237)  HBsAg Negative (04/30 1459)  HIV Non Reactive (08/29 1237)  HCVAb Non Reactive (04/30 1459)     Hospital course: Sceduled C/S   27 y.o. yo [id number] at [redacted]w[redacted]d was admitted to the hospital 03/11/2023 for scheduled cesarean section with the following indication:Macrosomia.Delivery details are as follows:  Membrane Rupture Time/Date: 1:29 PM,03/11/2023  Delivery Method:C-Section, Low Transverse Operative Delivery:N/A Details of operation can be found in separate operative note.  Patient had a postpartum course complicated by acute blood loss anemia. She received IV Venofer.  She is ambulating, tolerating a regular diet, passing flatus, and  urinating well. Patient is discharged home in stable condition on  03/13/23        Newborn Data: Birth date:03/11/2023 Birth time:1:30 PM Gender:Female Living status:Living Apgars:8 ,9  Weight:4200 g    She is discharged home on Miralax daily x30d and senna 2 tabs daily x 7 days.    MMR:N/A Varivax vaccine given: was not indicated T-DaP:Given prenatally Flu: declined RSV: received  Transfusion:No  Physical exam  Vitals:   03/12/23 1248 03/12/23 1624 03/12/23 2300 03/13/23 0803  BP: 109/65 112/74 126/82 115/72  Pulse:  94  91  Resp: 18 20 20 18   Temp: 98.3 F (36.8 C) 98.7 F (37.1 C) 97.9 F (36.6 C) 97.8 F (36.6 C)  TempSrc: Oral Oral  Oral  SpO2:  99%  99%  Weight:      Height:       General: alert, cooperative, and no distress Lochia: appropriate Uterine Fundus: firm Incision: Healing well with no significant drainage, No significant erythema, covered with occlusive OP site dressing   DVT Evaluation: No evidence of DVT seen on physical exam.  Labs: Lab Results  Component Value Date   WBC 21.6 (H) 03/12/2023   HGB 8.8 (L) 03/12/2023   HCT 27.4 (L) 03/12/2023   MCV 76.5 (L) 03/12/2023   PLT 245 03/12/2023      Latest Ref Rng & Units 03/11/2023  10:51 AM  CMP  Glucose 70 - 99 mg/dL 77   BUN 6 - 20 mg/dL 7   Creatinine 4.78 - 2.95 mg/dL 6.21   Sodium 308 - 657 mmol/L 133   Potassium 3.5 - 5.1 mmol/L 3.8   Chloride 98 - 111 mmol/L 105   CO2 22 - 32 mmol/L 21   Calcium 8.9 - 10.3 mg/dL 8.1   Total Protein 6.5 - 8.1 g/dL 6.9   Total Bilirubin <8.4 mg/dL 0.6   Alkaline Phos 38 - 126 U/L 232   AST 15 - 41 U/L 20   ALT 0 - 44 U/L 14    Edinburgh Score:    03/12/2023    7:31 AM  Edinburgh Postnatal Depression Scale Screening Tool  I have been able to laugh and see the funny side of things. 0  I have looked forward with enjoyment to things. 0  I have blamed myself unnecessarily when things went wrong. 2  I have been anxious or worried for no good  reason. 1  I have felt scared or panicky for no good reason. 0  Things have been getting on top of me. 1  I have been so unhappy that I have had difficulty sleeping. 0  I have felt sad or miserable. 0  I have been so unhappy that I have been crying. 0  The thought of harming myself has occurred to me. 0  Edinburgh Postnatal Depression Scale Total 4    Risk assessment for postpartum VTE and prophylactic treatment: Very high risk factors: None High risk factors: If 1 risk factor, mechanical prophylaxis and early ambulation  and BMI 40-50 kg/m2 Moderate risk factors: Cesarean delivery   Postpartum VTE prophylaxis with LMWH not indicated  After visit meds:  Allergies as of 03/13/2023       Reactions   Onion Swelling   Coconut (cocos Nucifera) Itching   Adhesive [tape] Other (See Comments)   Steri-Strips & Silk tape   Tape Rash   Reaction to Paper tape. Pt denies reaction to tegaderm or surgical tape.        Medication List     STOP taking these medications    aspirin EC 81 MG tablet       TAKE these medications    acetaminophen 325 MG tablet Commonly known as: TYLENOL Take 2 tablets (650 mg total) by mouth every 6 (six) hours as needed for mild pain (pain score 1-3) or fever. What changed:  medication strength how much to take reasons to take this   ibuprofen 600 MG tablet Commonly known as: ADVIL Take 1 tablet (600 mg total) by mouth every 6 (six) hours as needed for cramping, mild pain (pain score 1-3) or fever.   Iron (Ferrous Sulfate) 325 (65 Fe) MG Tabs Take 1 tablet by mouth daily at 6 (six) AM. What changed: when to take this   multivitamin-prenatal 27-0.8 MG Tabs tablet Take 1 tablet by mouth in the morning.   oxyCODONE 5 MG immediate release tablet Commonly known as: Oxy IR/ROXICODONE Take 1-2 tablets (5-10 mg total) by mouth every 4 (four) hours as needed for up to 7 days for severe pain (pain score 7-10) or breakthrough pain.   polyethylene  glycol 17 g packet Commonly known as: MIRALAX / GLYCOLAX Take 17 g by mouth daily for 7 days. Start taking on: March 14, 2023   senna 8.6 MG Tabs tablet Commonly known as: SENOKOT Take 1 tablet (8.6 mg total) by mouth at bedtime  as needed for mild constipation.   sertraline 50 MG tablet Commonly known as: ZOLOFT Take 1 tablet (50 mg total) by mouth daily. What changed: when to take this   simethicone 80 MG chewable tablet Commonly known as: MYLICON Chew 1 tablet (80 mg total) by mouth 4 (four) times daily as needed for flatulence.       Discharge home in stable condition Infant Feeding: Breast Infant Disposition:home with mother Discharge instruction: per After Visit Summary and Postpartum booklet. Activity: Advance as tolerated. Pelvic rest for 6 weeks.  Diet: routine diet Anticipated Birth Control: OCPs Postpartum Appointment:6 weeks Additional Postpartum F/U: Incision check 2 weeks Future Appointments:No future appointments. Follow up Visit:  Follow-up Information     Aryana Wonnacott V, MD Follow up in 2 week(s).   Specialty: Obstetrics and Gynecology Why: 2wk incision check Contact information: [address] [place] [address] [phone]         Atzin Buchta, Festus Holts, MD Follow up in 6 week(s).   Specialty: Obstetrics and Gynecology Why: 6wk postpartum Contact information: [address] [place] [address] [phone]                 Plan:  DILYN MCDAVITT was discharged to home in good condition. Follow-up appointment as directed.    Signed:  Janyce Llanos, CNM 03/13/2023 10:09 AM
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
## 2023-03-11 NOTE — Transfer of Care (Signed)
Immediate Anesthesia Transfer of Care Note  Patient: Suzanne Nelson  Procedure(s) Performed: CESAREAN SECTION  Patient Location: PACU  Anesthesia Type:Spinal  Level of Consciousness: awake, alert , and oriented  Airway & Oxygen Therapy: Patient Spontanous Breathing  Post-op Assessment: Report given to RN and Post -op Vital signs reviewed and stable  Post vital signs: Reviewed and stable  Last Vitals:  Vitals Value Taken Time  BP 125/84 03/11/23 1442  Temp    Pulse    Resp 16 03/11/23 1442  SpO2 100 % 03/11/23 1442    Last Pain:  Vitals:   03/11/23 1113  TempSrc: Oral  PainSc:          Complications: No notable events documented.

## 2023-03-11 NOTE — Anesthesia Procedure Notes (Signed)
Spinal  Patient location during procedure: OR Start time: 03/11/2023 12:55 PM End time: 03/11/2023 1:04 PM Reason for block: surgical anesthesia Staffing Performed: resident/CRNA  Anesthesiologist: Lenard Simmer, MD Resident/CRNA: Omer Jack, CRNA Performed by: Omer Jack, CRNA Authorized by: Lenard Simmer, MD   Preanesthetic Checklist Completed: patient identified, IV checked, site marked, risks and benefits discussed, surgical consent, monitors and equipment checked, pre-op evaluation and timeout performed Spinal Block Patient position: sitting Prep: DuraPrep Patient monitoring: heart rate, cardiac monitor, continuous pulse ox and blood pressure Approach: midline Location: L3-4 Injection technique: single-shot Needle Needle type: Sprotte  Needle gauge: 24 G Needle length: 9 cm Assessment Sensory level: T4 Events: CSF return

## 2023-03-12 LAB — RPR: RPR Ser Ql: NONREACTIVE

## 2023-03-12 LAB — CBC
HCT: 27.4 % — ABNORMAL LOW (ref 36.0–46.0)
Hemoglobin: 8.8 g/dL — ABNORMAL LOW (ref 12.0–15.0)
MCH: 24.6 pg — ABNORMAL LOW (ref 26.0–34.0)
MCHC: 32.1 g/dL (ref 30.0–36.0)
MCV: 76.5 fL — ABNORMAL LOW (ref 80.0–100.0)
Platelets: 245 10*3/uL (ref 150–400)
RBC: 3.58 MIL/uL — ABNORMAL LOW (ref 3.87–5.11)
RDW: 14.6 % (ref 11.5–15.5)
WBC: 21.6 10*3/uL — ABNORMAL HIGH (ref 4.0–10.5)
nRBC: 0 % (ref 0.0–0.2)

## 2023-03-12 MED ORDER — IRON SUCROSE 300 MG IVPB - SIMPLE MED
300.0000 mg | Freq: Once | Status: AC
  Administered 2023-03-12: 300 mg via INTRAVENOUS
  Filled 2023-03-12: qty 300

## 2023-03-12 MED ORDER — OXYCODONE HCL 5 MG PO TABS
5.0000 mg | ORAL_TABLET | ORAL | Status: DC | PRN
  Administered 2023-03-12 – 2023-03-13 (×2): 10 mg via ORAL
  Filled 2023-03-12 (×2): qty 2

## 2023-03-12 NOTE — Lactation Note (Signed)
This note was copied from a baby's chart. Lactation Consultation Note  Patient Name: Suzanne Nelson [id number] Date: 03/12/2023 Age:28 hours Reason for consult: Follow-up assessment;Term;Exclusive pumping and bottle feeding   Maternal Data This is mom's 2nd baby, primary C/S for macrosomia. Mom with history of anemia, obesity, depression, and marijuana use.  On follow-up visit mom reports she has only had 1 hour sleep. Per mom baby does not maintain latch and a nipple shield was tried last night. LC offered to assist with breastfeeding. At this time mom reports she has decided she wants to exclusively pump and feed. LC number on white board if mom changes her mind and would like breastfeeding assistance. Has patient been taught Hand Expression?: Yes Does the patient have breastfeeding experience prior to this delivery?: Yes How long did the patient breastfeed?: Briefly attempted breastfeeding with 1st baby post delivery while in the hospital. Per mom baby had a tongue tie and  would not latch even with a nipple shield.  Feeding Mother's Current Feeding Choice: Breast Milk and Formula Per mom she is exclusively pumping and bottle feeding baby any expressed breastmilk. She is also providing formula supplement.  Interventions Interventions: Education Provided mom with tips and strategies to maximize milk production. Encouraged mom to use the DEBP after discharge for the first 14 days to establish her milk supply. After 14 days mom can use her mom cozies. LC also recommended mom check the mom cozies brand she has as there is 1 mom cozies pump that is a hospital grade equivalent.   Discharge Discharge Education:  (Per mom she prefers LC return tomorrow to go over any discharge infor as she has only gotten 1 hour sleep and can't take in more information right now.) Pump: Personal (Mom has mom cozies. Per mom she also has a DEBP her friend gave her and mom reports she needs to find the cord that  goes to it.)  Consult Status Consult Status: Follow-up Date: 03/13/23 Follow-up type: In-patient  Update provided to care nurse.  Fuller Song 03/12/2023, 1:47 PM

## 2023-03-12 NOTE — Progress Notes (Signed)
Post Operative Day 1 Subjective: Doing well, no complaints.  Tolerating regular diet, pain with PO meds, voiding and ambulating without difficulty.  No CP SOB Fever,Chills, N/V or leg pain; denies nipple or breast pain, no HA change of vision, RUQ/epigastric pain  Objective: BP 99/67 (BP Location: Right Arm)   Pulse 97   Temp 98 F (36.7 C) (Oral)   Resp 18   Ht 5\' 2"  (1.575 m)   Wt 105.2 kg   LMP 06/03/2022 (Exact Date)   SpO2 96%   Breastfeeding Unknown   BMI 42.43 kg/m    Physical Exam:  General: NAD Breasts: soft/nontender  CV: RRR Pulm: nl effort, CTABL Abdomen: soft, NT, BS x 4 Incision:  Dsg CDI/Honeycomb dressing intact/no erythema or drainage Lochia: moderate Uterine Fundus: fundus firm and 1 fb below umbilicus DVT Evaluation: no cords, ttp LEs   Recent Labs    03/11/23 1051 03/12/23 0528  HGB 10.4* 8.8*  HCT 32.0* 27.4*  WBC 15.1* 21.6*  PLT 253 245    I/O last 3 completed shifts: In: 5430 [P.O.:4680; I.V.:600; IV Piggyback:150] Out: 4050 [Urine:3500; Blood:550] No intake/output data recorded.   Assessment/Plan: 27 y.o. [id number] post-operative day # 1  - Continue routine PP care - Lactation consult PRN  - Postpartum contraception: planning OCPs - Acute blood loss anemia, clinically significant - hemoglobin changed from 10.4 to 8.8, patient is asymptomatic, hemodynamically stable; start po ferrous sulfate BID with stool softeners, administer Venofer IV  - WBC increased 15.1 -> 21.6, afebrile, no tachycardia - Immunization status: all Imms up to date  Disposition: Does not desire Dc home today.   Janyce Llanos, CNM 03/12/2023 11:20 AM

## 2023-03-12 NOTE — Anesthesia Postprocedure Evaluation (Signed)
Anesthesia Post Note  Patient: Suzanne Nelson  Procedure(s) Performed: CESAREAN SECTION  Patient location during evaluation: Mother Baby Anesthesia Type: Spinal Level of consciousness: awake and alert Pain management: pain level controlled Vital Signs Assessment: post-procedure vital signs reviewed and stable Respiratory status: spontaneous breathing and respiratory function stable Cardiovascular status: blood pressure returned to baseline and stable Postop Assessment: no headache, adequate PO intake, able to ambulate, patient able to bend at knees and no apparent nausea or vomiting Anesthetic complications: no   No notable events documented.   Last Vitals:  Vitals:   03/12/23 0425 03/12/23 0859  BP: (!) 107/56 99/67  Pulse:  97  Resp: 20 18  Temp: 37.1 C 36.7 C  SpO2:      Last Pain:  Vitals:   03/12/23 0859  TempSrc: Oral  PainSc:                  Foye Deer

## 2023-03-13 MED ORDER — OXYCODONE HCL 5 MG PO TABS: 5.0000 mg | ORAL_TABLET | ORAL | 0 refills | Status: AC | PRN

## 2023-03-13 MED ORDER — ACETAMINOPHEN 325 MG PO TABS: 650.0000 mg | ORAL_TABLET | Freq: Four times a day (QID) | ORAL | 0 refills | Status: AC | PRN

## 2023-03-13 MED ORDER — SENNA 8.6 MG PO TABS: 1.0000 | ORAL_TABLET | Freq: Every evening | ORAL | 0 refills | Status: DC | PRN

## 2023-03-13 MED ORDER — POLYETHYLENE GLYCOL 3350 17 G PO PACK: 17.0000 g | PACK | Freq: Every day | ORAL | 0 refills | Status: AC

## 2023-03-13 MED ORDER — SIMETHICONE 80 MG PO CHEW: 80.0000 mg | CHEWABLE_TABLET | Freq: Four times a day (QID) | ORAL | 0 refills | Status: DC | PRN

## 2023-03-13 MED ORDER — IBUPROFEN 600 MG PO TABS: 600.0000 mg | ORAL_TABLET | Freq: Four times a day (QID) | ORAL | 0 refills | Status: DC | PRN
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
## 2023-03-13 NOTE — Discharge Instructions (Signed)
Call office if you have any of the following:  -Persistent headache or visual changes (possible high blood pressure) -Fever >101.0 F or chills (possible infection) -Breast concerns (engorgement, mastitis) -Excessive vaginal bleeding (soaking through more than one pad in 1 hr x 2 hr) -Incision drainage/redness/increased pain/warmth at site (possible infection)  -Leg pain or redness (possible blood clot) -Depression/anxiety increased symptoms 2 weeks after delivery  Activity & Hygiene: -Do not lift > 10 lbs for 6 weeks.  -No intercourse or tampons for 6 weeks.  -No swimming pools, hot tubs or tub baths- showers only for 6 weeks **No driving for 1-2 weeks or while taking pain medication after c-section  -It is normal to bleed for up to 6 weeks. You should not soak through more than 1 pad in 1 hour x 2 hours.  Breastfeeding: -Continue prenatal vitamin.  -Increase calories and fluids.  -Your milk will come in, in the next couple of days (right now it is colostrum).  -You may have a slight fever when your milk comes in, but it should go away on its own.   -If it does not, and rises above 101 F please call the doctor.  -You will also feel achy and your breasts will be firm. They will also start to leak.  *For breastfeeding concerns, the lactation consultant can be reached at [phone]  Not Breastfeeding: -Avoid breast stimulation and wear supportive bra -ICE helps decrease inflammation and pain -Express milk for comfort by hand, do not empty breast  Postpartum blues: -feelings of happy one minute and sad another minute are normal for the first few weeks. -if it gets worse please let your doctor know. It is very common!!
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
## 2023-03-13 NOTE — Progress Notes (Signed)
Patient discharged home with family.  Discharge instructions, when to follow up, and medications reviewed with patient.  Patient verbalized understanding. Patient will be escorted out by staff.

## 2023-03-14 ENCOUNTER — Encounter: Payer: Self-pay | Admitting: Obstetrics and Gynecology

## 2023-03-16 ENCOUNTER — Ambulatory Visit: Payer: Medicaid Other

## 2023-09-01 ENCOUNTER — Encounter: Payer: Self-pay | Admitting: Emergency Medicine

## 2023-09-01 ENCOUNTER — Other Ambulatory Visit: Payer: Self-pay

## 2023-09-01 ENCOUNTER — Inpatient Hospital Stay
Admission: EM | Admit: 2023-09-01 | Discharge: 2023-09-03 | DRG: 661 | Disposition: A | Discharge status: Home / Self Care | Attending: Internal Medicine | Admitting: Internal Medicine

## 2023-09-01 ENCOUNTER — Emergency Department

## 2023-09-01 DIAGNOSIS — N132 Hydronephrosis with renal and ureteral calculous obstruction: Secondary | ICD-10-CM | POA: Diagnosis present

## 2023-09-01 DIAGNOSIS — Z8249 Family history of ischemic heart disease and other diseases of the circulatory system: Secondary | ICD-10-CM

## 2023-09-01 DIAGNOSIS — Z818 Family history of other mental and behavioral disorders: Secondary | ICD-10-CM

## 2023-09-01 DIAGNOSIS — Z5971 Insufficient health insurance coverage: Secondary | ICD-10-CM

## 2023-09-01 DIAGNOSIS — Z79899 Other long term (current) drug therapy: Secondary | ICD-10-CM

## 2023-09-01 DIAGNOSIS — N136 Pyonephrosis: Principal | ICD-10-CM | POA: Diagnosis present

## 2023-09-01 DIAGNOSIS — Z833 Family history of diabetes mellitus: Secondary | ICD-10-CM

## 2023-09-01 DIAGNOSIS — Z87891 Personal history of nicotine dependence: Secondary | ICD-10-CM

## 2023-09-01 DIAGNOSIS — Z803 Family history of malignant neoplasm of breast: Secondary | ICD-10-CM

## 2023-09-01 DIAGNOSIS — Z91018 Allergy to other foods: Secondary | ICD-10-CM

## 2023-09-01 DIAGNOSIS — Z87442 Personal history of urinary calculi: Secondary | ICD-10-CM

## 2023-09-01 DIAGNOSIS — N200 Calculus of kidney: Principal | ICD-10-CM

## 2023-09-01 DIAGNOSIS — N3289 Other specified disorders of bladder: Secondary | ICD-10-CM | POA: Diagnosis not present

## 2023-09-01 DIAGNOSIS — Z91048 Other nonmedicinal substance allergy status: Secondary | ICD-10-CM

## 2023-09-01 DIAGNOSIS — F32A Depression, unspecified: Secondary | ICD-10-CM | POA: Diagnosis present

## 2023-09-01 DIAGNOSIS — J45909 Unspecified asthma, uncomplicated: Secondary | ICD-10-CM | POA: Insufficient documentation

## 2023-09-01 DIAGNOSIS — N390 Urinary tract infection, site not specified: Secondary | ICD-10-CM | POA: Diagnosis present

## 2023-09-01 DIAGNOSIS — F419 Anxiety disorder, unspecified: Secondary | ICD-10-CM | POA: Insufficient documentation

## 2023-09-01 LAB — HEPATIC FUNCTION PANEL
ALT: 60 U/L — ABNORMAL HIGH (ref 0–44)
AST: 40 U/L (ref 15–41)
Albumin: 4.6 g/dL (ref 3.5–5.0)
Alkaline Phosphatase: 79 U/L (ref 38–126)
Bilirubin, Direct: <0.1 mg/dL (ref 0.0–0.2)
Total Bilirubin: 0.8 mg/dL (ref 0.0–1.2)
Total Protein: 7.5 g/dL (ref 6.5–8.1)

## 2023-09-01 LAB — URINALYSIS, ROUTINE W REFLEX MICROSCOPIC
RBC / HPF: >50 RBC/hpf (ref 0–5)
WBC, UA: >50 WBC/hpf (ref 0–5)

## 2023-09-01 LAB — BASIC METABOLIC PANEL WITH GFR
Anion gap: 12 (ref 5–15)
BUN: 8 mg/dL (ref 6–20)
CO2: 20 mmol/L — ABNORMAL LOW (ref 22–32)
Calcium: 9 mg/dL (ref 8.9–10.3)
Chloride: 106 mmol/L (ref 98–111)
Creatinine, Ser: 0.66 mg/dL (ref 0.44–1.00)
GFR, Estimated: >60 mL/min (ref >60)
Glucose, Bld: 116 mg/dL — ABNORMAL HIGH (ref 70–99)
Potassium: 3.8 mmol/L (ref 3.5–5.1)
Sodium: 138 mmol/L (ref 135–145)

## 2023-09-01 LAB — CBC
HCT: 44.3 % (ref 36.0–46.0)
Hemoglobin: 14.8 g/dL (ref 12.0–15.0)
MCH: 28.2 pg (ref 26.0–34.0)
MCHC: 33.4 g/dL (ref 30.0–36.0)
MCV: 84.5 fL (ref 80.0–100.0)
Platelets: 385 10*3/uL (ref 150–400)
RBC: 5.24 MIL/uL — ABNORMAL HIGH (ref 3.87–5.11)
RDW: 13.9 % (ref 11.5–15.5)
WBC: 18.5 10*3/uL — ABNORMAL HIGH (ref 4.0–10.5)
nRBC: 0 % (ref 0.0–0.2)

## 2023-09-01 LAB — LACTIC ACID, PLASMA: Lactic Acid, Venous: 0.7 mmol/L (ref 0.5–1.9)

## 2023-09-01 LAB — POC URINE PREG, ED: Preg Test, Ur: NEGATIVE

## 2023-09-01 LAB — LIPASE, BLOOD: Lipase: 28 U/L (ref 11–51)

## 2023-09-01 MED ORDER — MORPHINE SULFATE (PF) 4 MG/ML IV SOLN
4.0000 mg | Freq: Once | INTRAVENOUS | Status: AC
  Administered 2023-09-01: 4 mg via INTRAVENOUS
  Filled 2023-09-01: qty 1

## 2023-09-01 MED ORDER — SODIUM CHLORIDE 0.9 % IV BOLUS
1000.0000 mL | Freq: Once | INTRAVENOUS | Status: AC
  Administered 2023-09-01: 1000 mL via INTRAVENOUS

## 2023-09-01 MED ORDER — ONDANSETRON HCL 4 MG/2ML IJ SOLN
4.0000 mg | INTRAMUSCULAR | Status: AC
  Administered 2023-09-01: 4 mg via INTRAVENOUS
  Filled 2023-09-01: qty 2

## 2023-09-01 MED ORDER — SODIUM CHLORIDE 0.9 % IV SOLN
1.0000 g | INTRAVENOUS | Status: AC
  Administered 2023-09-01: 1 g via INTRAVENOUS
  Filled 2023-09-01: qty 10

## 2023-09-01 NOTE — ED Provider Notes (Signed)
 [facility] Provider Note   Event Date/Time   First MD Initiated Contact with Patient 09/01/23 1845     (approximate)  History   Flank Pain  HPI  Suzanne Nelson is a 28 y.o. female with a history of for cesarean section.  Noted to have a history of anemia, prior UTI.  This morning patient woke up with left flank pain and vomiting.  She reports that she thought maybe it could be a little bit of a stomach bug type issue since her daughter had a stomach virus about a week ago, but she has not had fever.  She also reports a history of a kidney stone that required surgical removal in around 2016  She is having pain and fairly sharp left flank and left back.  She also noticed small amount of blood intermixed with urine or with stool earlier.  No vaginal bleeding or pelvic pain.  No vaginal discharge.  Denies pregnancy       Physical Exam   Triage Vital Signs: ED Triage Vitals  Encounter Vitals Group     BP 09/01/23 1809 (!) 150/98     Systolic BP Percentile --      Diastolic BP Percentile --      Pulse Rate 09/01/23 1809 69     Resp 09/01/23 1809 17     Temp 09/01/23 1809 98.1 F (36.7 C)     Temp Source 09/01/23 1809 Oral     SpO2 09/01/23 1809 96 %     Weight 09/01/23 1808 175 lb (79.4 kg)     Height 09/01/23 1808 5\' 2"  (1.575 m)     Head Circumference --      Peak Flow --      Pain Score 09/01/23 1808 10     Pain Loc --      Pain Education --      Exclude from Growth Chart --     Most recent vital signs: Vitals:   09/01/23 1809 09/01/23 2145  BP: (!) 150/98 114/73  Pulse: 69 80  Resp: 17 16  Temp: 98.1 F (36.7 C)   SpO2: 96%      General: Awake, no distress.  Appears mildly ill without acute distress CV:  Good peripheral perfusion.  Normal tones Resp:  Normal effort.  Abd:  No distention.  Abdomen soft nontender nondistended in the right upper quadrant and right lower but mild discomfort through palpation of the left mid flank  region, and mild and reproducible pain with percussion over the left costovertebral angle none noted on the right Other:     ED Results / Procedures / Treatments   Labs (all labs ordered are listed, but only abnormal results are displayed) Labs Reviewed  URINALYSIS, ROUTINE W REFLEX MICROSCOPIC - Abnormal; Notable for the following components:      Result Value   Color, Urine RED (*)    APPearance TURBID (*)    Glucose, UA   (*)    Value: TEST NOT REPORTED DUE TO COLOR INTERFERENCE OF URINE PIGMENT   Hgb urine dipstick   (*)    Value: TEST NOT REPORTED DUE TO COLOR INTERFERENCE OF URINE PIGMENT   Bilirubin Urine   (*)    Value: TEST NOT REPORTED DUE TO COLOR INTERFERENCE OF URINE PIGMENT   Ketones, ur   (*)    Value: TEST NOT REPORTED DUE TO COLOR INTERFERENCE OF URINE PIGMENT   Protein, ur   (*)  Value: TEST NOT REPORTED DUE TO COLOR INTERFERENCE OF URINE PIGMENT   Nitrite   (*)    Value: TEST NOT REPORTED DUE TO COLOR INTERFERENCE OF URINE PIGMENT   Leukocytes,Ua   (*)    Value: TEST NOT REPORTED DUE TO COLOR INTERFERENCE OF URINE PIGMENT   Bacteria, UA FEW (*)    All other components within normal limits  BASIC METABOLIC PANEL WITH GFR - Abnormal; Notable for the following components:   CO2 20 (*)    Glucose, Bld 116 (*)    All other components within normal limits  CBC - Abnormal; Notable for the following components:   WBC 18.5 (*)    RBC 5.24 (*)    All other components within normal limits  HEPATIC FUNCTION PANEL - Abnormal; Notable for the following components:   ALT 60 (*)    All other components within normal limits  URINE CULTURE  CULTURE, BLOOD (ROUTINE X 2)  CULTURE, BLOOD (ROUTINE X 2)  LIPASE, BLOOD  LACTIC ACID, PLASMA  POC URINE PREG, ED     EKG     RADIOLOGY  CT Renal Stone Study Result Date: 09/01/2023 CLINICAL DATA:  Left side flank pain EXAM: CT ABDOMEN AND PELVIS WITHOUT CONTRAST TECHNIQUE: Multidetector CT imaging of the abdomen and  pelvis was performed following the standard protocol without IV contrast. RADIATION DOSE REDUCTION: This exam was performed according to the departmental dose-optimization program which includes automated exposure control, adjustment of the mA and/or kV according to patient size and/or use of iterative reconstruction technique. COMPARISON:  05/18/2015 FINDINGS: Lower chest: No acute findings. Hepatobiliary: No focal hepatic abnormality. Gallbladder unremarkable. Pancreas: No focal abnormality or ductal dilatation. Spleen: No focal abnormality.  Normal size. Adrenals/Urinary Tract: Adrenal glands normal. Left hydronephrosis and hydroureter due to 3 mm distal left ureteral stone. No stones or hydronephrosis on the right. Urinary bladder unremarkable. Stomach/Bowel: Normal appendix. Stomach, large and small bowel grossly unremarkable. Vascular/Lymphatic: No evidence of aneurysm or adenopathy. Reproductive: Uterus and adnexa unremarkable.  No mass. Other: No free fluid or free air. Musculoskeletal: No acute bony abnormality. IMPRESSION: 3 mm distal left ureteral stone with mild left hydronephrosis and hydroureter. Electronically Signed   By: Janeece Mechanic M.D.   On: 09/01/2023 22:21     CT imaging clinical history and labs discussed with Dr. Minus Amel.  He recommends admission, initiate Rocephin , and urology to provide full consultation in the morning   PROCEDURES:  Critical Care performed: No  Procedures   MEDICATIONS ORDERED IN ED: Medications  cefTRIAXone  (ROCEPHIN ) 1 g in sodium chloride  0.9 % 100 mL IVPB (has no administration in time range)  sodium chloride  0.9 % bolus 1,000 mL (1,000 mLs Intravenous New Bag/Given 09/01/23 2142)  morphine  (PF) 4 MG/ML injection 4 mg (4 mg Intravenous Given 09/01/23 2141)  ondansetron  (ZOFRAN ) injection 4 mg (4 mg Intravenous Given 09/01/23 2141)     IMPRESSION / MDM / ASSESSMENT AND PLAN / ED COURSE  I reviewed the triage vital signs and the nursing notes.                              Differential diagnosis includes but is not limited to, abdominal perforation, aortic dissection, cholecystitis, appendicitis, diverticulitis, colitis, esophagitis/gastritis, kidney stone, pyelonephritis, urinary tract infection, aortic aneurysm. All are considered in decision and treatment plan. Based upon the patient's presentation and risk factors, high probability for ureteral/urinary type etiology based on clinical history presentation.  Urine preg test negative  Patient's presentation is most consistent with acute complicated illness / injury requiring diagnostic workup.   ----------------------------------------- 11:10 PM on 09/01/2023 ----------------------------------------- Patient reports excellent pain control fully awake and alert stable hemodynamics.  Understand agreeable with plan for admission.  Consulted with patient accepted to hospitalist service by Dr. Vallarie Gauze, urology consult to follow this morning.  Conveyed to hospitalist recommendation for n.p.o. at midnight         FINAL CLINICAL IMPRESSION(S) / ED DIAGNOSES   Final diagnoses:  Kidney stone on left side  Urinary tract infection, acute     Rx / DC Orders   ED Discharge Orders     None        Note:  This document was prepared using Dragon voice recognition software and may include unintentional dictation errors.   Iver Marker, MD 09/01/23 2311

## 2023-09-01 NOTE — ED Triage Notes (Signed)
 Patient to ED via POV for left side flank pain. Started this AM with intermittent vomiting. Hx kidney stones.

## 2023-09-02 ENCOUNTER — Observation Stay: Admitting: Anesthesiology

## 2023-09-02 ENCOUNTER — Encounter: Payer: Self-pay | Admitting: Internal Medicine

## 2023-09-02 ENCOUNTER — Observation Stay

## 2023-09-02 ENCOUNTER — Encounter
Admission: EM | Disposition: A | Discharge status: Home / Self Care | Payer: Self-pay | Source: Home / Self Care | Attending: Internal Medicine

## 2023-09-02 DIAGNOSIS — F419 Anxiety disorder, unspecified: Secondary | ICD-10-CM

## 2023-09-02 DIAGNOSIS — Z87442 Personal history of urinary calculi: Secondary | ICD-10-CM | POA: Diagnosis not present

## 2023-09-02 DIAGNOSIS — Z803 Family history of malignant neoplasm of breast: Secondary | ICD-10-CM | POA: Diagnosis not present

## 2023-09-02 DIAGNOSIS — J4520 Mild intermittent asthma, uncomplicated: Secondary | ICD-10-CM

## 2023-09-02 DIAGNOSIS — Z79899 Other long term (current) drug therapy: Secondary | ICD-10-CM | POA: Diagnosis not present

## 2023-09-02 DIAGNOSIS — J45909 Unspecified asthma, uncomplicated: Secondary | ICD-10-CM | POA: Insufficient documentation

## 2023-09-02 DIAGNOSIS — Z818 Family history of other mental and behavioral disorders: Secondary | ICD-10-CM | POA: Diagnosis not present

## 2023-09-02 DIAGNOSIS — N136 Pyonephrosis: Secondary | ICD-10-CM | POA: Diagnosis present

## 2023-09-02 DIAGNOSIS — N132 Hydronephrosis with renal and ureteral calculous obstruction: Secondary | ICD-10-CM | POA: Diagnosis not present

## 2023-09-02 DIAGNOSIS — Z91048 Other nonmedicinal substance allergy status: Secondary | ICD-10-CM | POA: Diagnosis not present

## 2023-09-02 DIAGNOSIS — N390 Urinary tract infection, site not specified: Secondary | ICD-10-CM

## 2023-09-02 DIAGNOSIS — N1330 Unspecified hydronephrosis: Secondary | ICD-10-CM

## 2023-09-02 DIAGNOSIS — N3289 Other specified disorders of bladder: Secondary | ICD-10-CM | POA: Diagnosis not present

## 2023-09-02 DIAGNOSIS — Z87891 Personal history of nicotine dependence: Secondary | ICD-10-CM | POA: Diagnosis not present

## 2023-09-02 DIAGNOSIS — Z91018 Allergy to other foods: Secondary | ICD-10-CM | POA: Diagnosis not present

## 2023-09-02 DIAGNOSIS — Z8249 Family history of ischemic heart disease and other diseases of the circulatory system: Secondary | ICD-10-CM | POA: Diagnosis not present

## 2023-09-02 DIAGNOSIS — N200 Calculus of kidney: Principal | ICD-10-CM

## 2023-09-02 DIAGNOSIS — N201 Calculus of ureter: Secondary | ICD-10-CM

## 2023-09-02 DIAGNOSIS — F32A Depression, unspecified: Secondary | ICD-10-CM | POA: Diagnosis present

## 2023-09-02 DIAGNOSIS — Z833 Family history of diabetes mellitus: Secondary | ICD-10-CM | POA: Diagnosis not present

## 2023-09-02 DIAGNOSIS — Z5971 Insufficient health insurance coverage: Secondary | ICD-10-CM | POA: Diagnosis not present

## 2023-09-02 HISTORY — PX: CYSTOSCOPY W/ RETROGRADES: SHX1426

## 2023-09-02 HISTORY — PX: CYSTOSCOPY/URETEROSCOPY/HOLMIUM LASER/STENT PLACEMENT: SHX6546

## 2023-09-02 SURGERY — CYSTOSCOPY/URETEROSCOPY/HOLMIUM LASER/STENT PLACEMENT
Anesthesia: General | Laterality: Left

## 2023-09-02 SURGERY — CYSTOURETEROSCOPY, USING HOLMIUM LASER
Anesthesia: Choice | Laterality: Left

## 2023-09-02 MED ORDER — EPHEDRINE SULFATE-NACL 50-0.9 MG/10ML-% IV SOSY
PREFILLED_SYRINGE | INTRAVENOUS | Status: DC | PRN
  Administered 2023-09-02 (×2): 5 mg via INTRAVENOUS

## 2023-09-02 MED ORDER — DEXAMETHASONE SODIUM PHOSPHATE 10 MG/ML IJ SOLN
INTRAMUSCULAR | Status: AC
  Filled 2023-09-02: qty 1

## 2023-09-02 MED ORDER — MIDAZOLAM HCL 2 MG/2ML IJ SOLN
INTRAMUSCULAR | Status: DC | PRN
  Administered 2023-09-02: 2 mg via INTRAVENOUS

## 2023-09-02 MED ORDER — KETOROLAC TROMETHAMINE 30 MG/ML IJ SOLN: 30.0000 mg | Freq: Four times a day (QID) | INTRAMUSCULAR | Status: DC | PRN

## 2023-09-02 MED ORDER — FENTANYL CITRATE (PF) 100 MCG/2ML IJ SOLN
25.0000 ug | INTRAMUSCULAR | Status: DC | PRN
  Administered 2023-09-02 (×2): 25 ug via INTRAVENOUS
  Administered 2023-09-02: 50 ug via INTRAVENOUS

## 2023-09-02 MED ORDER — LIDOCAINE HCL (CARDIAC) PF 100 MG/5ML IV SOSY
PREFILLED_SYRINGE | INTRAVENOUS | Status: DC | PRN
  Administered 2023-09-02: 60 mg via INTRAVENOUS

## 2023-09-02 MED ORDER — SUCCINYLCHOLINE CHLORIDE 200 MG/10ML IV SOSY
PREFILLED_SYRINGE | INTRAVENOUS | Status: DC | PRN
  Administered 2023-09-02: 100 mg via INTRAVENOUS

## 2023-09-02 MED ORDER — ACETAMINOPHEN 325 MG PO TABS: 650.0000 mg | ORAL_TABLET | Freq: Four times a day (QID) | ORAL | Status: DC | PRN

## 2023-09-02 MED ORDER — OXYCODONE HCL 5 MG/5ML PO SOLN: 5.0000 mg | Freq: Once | ORAL | Status: AC | PRN

## 2023-09-02 MED ORDER — SODIUM CHLORIDE 0.9 % IR SOLN
Status: DC | PRN
  Administered 2023-09-02: 3000 mL via INTRAVESICAL

## 2023-09-02 MED ORDER — SODIUM CHLORIDE 0.9 % IV SOLN: INTRAVENOUS | Status: AC

## 2023-09-02 MED ORDER — FENTANYL CITRATE (PF) 100 MCG/2ML IJ SOLN
INTRAMUSCULAR | Status: AC
  Filled 2023-09-02: qty 2

## 2023-09-02 MED ORDER — OXYCODONE HCL 5 MG PO TABS
5.0000 mg | ORAL_TABLET | Freq: Once | ORAL | Status: AC | PRN
  Administered 2023-09-02: 5 mg via ORAL

## 2023-09-02 MED ORDER — DEXAMETHASONE SODIUM PHOSPHATE 10 MG/ML IJ SOLN
INTRAMUSCULAR | Status: DC | PRN
  Administered 2023-09-02: 10 mg via INTRAVENOUS

## 2023-09-02 MED ORDER — FENTANYL CITRATE (PF) 100 MCG/2ML IJ SOLN
INTRAMUSCULAR | Status: DC | PRN
  Administered 2023-09-02: 25 ug via INTRAVENOUS
  Administered 2023-09-02: 50 ug via INTRAVENOUS

## 2023-09-02 MED ORDER — ROCURONIUM BROMIDE 10 MG/ML (PF) SYRINGE
PREFILLED_SYRINGE | INTRAVENOUS | Status: AC
  Filled 2023-09-02: qty 10

## 2023-09-02 MED ORDER — ONDANSETRON HCL 4 MG PO TABS: 4.0000 mg | ORAL_TABLET | Freq: Four times a day (QID) | ORAL | Status: DC | PRN

## 2023-09-02 MED ORDER — OXYBUTYNIN CHLORIDE 5 MG PO TABS
5.0000 mg | ORAL_TABLET | Freq: Three times a day (TID) | ORAL | Status: DC
  Administered 2023-09-02 – 2023-09-03 (×3): 5 mg via ORAL
  Filled 2023-09-02 (×3): qty 1

## 2023-09-02 MED ORDER — IOHEXOL 180 MG/ML  SOLN
INTRAMUSCULAR | Status: DC | PRN
  Administered 2023-09-02: 10 mL

## 2023-09-02 MED ORDER — ALPRAZOLAM 0.25 MG PO TABS
0.2500 mg | ORAL_TABLET | Freq: Once | ORAL | Status: AC
  Administered 2023-09-02: 0.25 mg via ORAL
  Filled 2023-09-02: qty 1

## 2023-09-02 MED ORDER — MORPHINE SULFATE (PF) 2 MG/ML IV SOLN: 2.0000 mg | INTRAVENOUS | Status: DC | PRN

## 2023-09-02 MED ORDER — PROPOFOL 10 MG/ML IV BOLUS
INTRAVENOUS | Status: DC | PRN
  Administered 2023-09-02: 180 mg via INTRAVENOUS

## 2023-09-02 MED ORDER — KETOROLAC TROMETHAMINE 30 MG/ML IJ SOLN
INTRAMUSCULAR | Status: DC | PRN
Start: 2023-09-02 — End: 2023-09-02
  Administered 2023-09-02: 15 mg via INTRAVENOUS

## 2023-09-02 MED ORDER — SODIUM CHLORIDE 0.9 % IV SOLN
1.0000 g | INTRAVENOUS | Status: DC
  Administered 2023-09-02 – 2023-09-03 (×2): 1 g via INTRAVENOUS
  Filled 2023-09-02 (×2): qty 10

## 2023-09-02 MED ORDER — OXYCODONE HCL 5 MG PO TABS
ORAL_TABLET | ORAL | Status: AC
  Filled 2023-09-02: qty 1

## 2023-09-02 MED ORDER — DEXMEDETOMIDINE HCL IN NACL 80 MCG/20ML IV SOLN
INTRAVENOUS | Status: DC | PRN
  Administered 2023-09-02: 8 ug via INTRAVENOUS

## 2023-09-02 MED ORDER — ONDANSETRON HCL 4 MG/2ML IJ SOLN
INTRAMUSCULAR | Status: DC | PRN
  Administered 2023-09-02: 4 mg via INTRAVENOUS

## 2023-09-02 MED ORDER — ONDANSETRON HCL 4 MG/2ML IJ SOLN: 4.0000 mg | Freq: Four times a day (QID) | INTRAMUSCULAR | Status: DC | PRN

## 2023-09-02 MED ORDER — SUCCINYLCHOLINE CHLORIDE 200 MG/10ML IV SOSY
PREFILLED_SYRINGE | INTRAVENOUS | Status: AC
  Filled 2023-09-02: qty 10

## 2023-09-02 MED ORDER — ACETAMINOPHEN 650 MG RE SUPP: 650.0000 mg | Freq: Four times a day (QID) | RECTAL | Status: DC | PRN

## 2023-09-02 MED ORDER — EPHEDRINE 5 MG/ML INJ
INTRAVENOUS | Status: AC
  Filled 2023-09-02: qty 5

## 2023-09-02 MED ORDER — HYDROCODONE-ACETAMINOPHEN 5-325 MG PO TABS
1.0000 | ORAL_TABLET | ORAL | Status: DC | PRN
  Administered 2023-09-02 (×3): 1 via ORAL
  Administered 2023-09-03: 2 via ORAL
  Administered 2023-09-03: 1 via ORAL
  Filled 2023-09-02: qty 1
  Filled 2023-09-02: qty 2
  Filled 2023-09-02 (×3): qty 1

## 2023-09-02 MED ORDER — MIDAZOLAM HCL 2 MG/2ML IJ SOLN
INTRAMUSCULAR | Status: AC
  Filled 2023-09-02: qty 2

## 2023-09-02 MED ORDER — KETOROLAC TROMETHAMINE 30 MG/ML IJ SOLN
INTRAMUSCULAR | Status: AC
  Filled 2023-09-02: qty 1

## 2023-09-02 MED ORDER — ONDANSETRON HCL 4 MG/2ML IJ SOLN
INTRAMUSCULAR | Status: AC
  Filled 2023-09-02: qty 2

## 2023-09-02 SURGICAL SUPPLY — 26 items
ADHESIVE MASTISOL STRL (MISCELLANEOUS) IMPLANT
BAG DRAIN SIEMENS DORNER NS (MISCELLANEOUS) ×2 IMPLANT
BAG PRESSURE INF REUSE 3000 (BAG) ×2 IMPLANT
BRUSH SCRUB EZ 1% IODOPHOR (MISCELLANEOUS) ×2 IMPLANT
CATH URET FLEX-TIP 2 LUMEN 10F (CATHETERS) IMPLANT
CATH URETL OPEN 5X70 (CATHETERS) IMPLANT
CNTNR URN SCR LID CUP LEK RST (MISCELLANEOUS) IMPLANT
DRAPE UTILITY 15X26 TOWEL STRL (DRAPES) ×2 IMPLANT
DRSG TEGADERM 2-3/8X2-3/4 SM (GAUZE/BANDAGES/DRESSINGS) IMPLANT
FIBER LASER MOSES 200 DFL (Laser) IMPLANT
FIBER LASER MOSES 365 DFL (Laser) IMPLANT
GLOVE BIOGEL PI IND STRL 7.5 (GLOVE) ×2 IMPLANT
GOWN STRL REUS W/ TWL LRG LVL3 (GOWN DISPOSABLE) ×2 IMPLANT
GOWN STRL REUS W/ TWL XL LVL3 (GOWN DISPOSABLE) ×2 IMPLANT
GUIDEWIRE STR DUAL SENSOR (WIRE) ×2 IMPLANT
KIT TURNOVER CYSTO (KITS) ×2 IMPLANT
PACK CYSTO AR (MISCELLANEOUS) ×2 IMPLANT
SET CYSTO W/LG BORE CLAMP LF (SET/KITS/TRAYS/PACK) ×2 IMPLANT
SHEATH NAVIGATOR HD 12/14X36 (SHEATH) IMPLANT
SOL .9 NS 3000ML IRR UROMATIC (IV SOLUTION) ×2 IMPLANT
STENT URET 6FRX24 CONTOUR (STENTS) IMPLANT
STENT URET 6FRX26 CONTOUR (STENTS) IMPLANT
SURGILUBE 2OZ TUBE FLIPTOP (MISCELLANEOUS) ×2 IMPLANT
SYR 10ML LL (SYRINGE) ×2 IMPLANT
VALVE UROSEAL ADJ ENDO (VALVE) IMPLANT
WATER STERILE IRR 500ML POUR (IV SOLUTION) ×2 IMPLANT

## 2023-09-02 NOTE — H&P (View-Only) (Signed)
 Urology Consult   I have been asked to see the patient by Dr. Vallarie Gauze, for evaluation and management of left ureteral stone, possible UTI.  Chief Complaint: Left flank pain  HPI:  Suzanne Nelson is a 28 y.o. female with history of stones in 2017 requiring ureteroscopy who presented to the ER overnight with severe left-sided flank pain for 2 to 3 days.  She denies any UTI symptoms, fevers or chills.  She had leukocytosis to 18k and equivocal urinalysis, was started on antibiotics and admitted to the hospital for pain control.  She recently lost her insurance.  She has been unable to care for her kids with her pain at home.  PMH: Past Medical History:  Diagnosis Date   ADHD (attention deficit hyperactivity disorder)    Anemia    Anxiety    Anxiety    Asthma    Asthma    BV (bacterial vaginosis) 10/19/2019   Noted after NOB. Rx for Metrogel    Calculus of distal left ureter 05/18/2015   Depression    Eating disorder 08/17/2022   Encounter for care or examination of lactating mother 05/18/2020   History of eating disorder 08/17/2022   Hydronephrosis 05/18/2015   Left ureteral stone 05/18/2015   Migraine    No pertinent past medical history    Obesity during pregnancy, antepartum 08/17/2022   Panic attack    Pneumonia    Pyelonephritis affecting pregnancy in third trimester    Spotting affecting pregnancy in third trimester 01/22/2023   Subchorionic hemorrhage in first trimester 08/02/2022   Seen on 4/11 US       Surgical History: Past Surgical History:  Procedure Laterality Date   CESAREAN SECTION N/A 03/11/2023   Procedure: CESAREAN SECTION;  Surgeon: Vita Grip, MD;  Location: [facility] ORS;  Service: Obstetrics;  Laterality: N/A;   CYSTO N/A 05/19/2015   Procedure: CYSTO;  Surgeon: Dustin Gimenez, MD;  Location: [facility] ORS;  Service: Urology;  Laterality: N/A;   STENT PLACE LEFT URETER ([facility] HX)     URETEROSCOPY WITH HOLMIUM LASER LITHOTRIPSY Left 05/19/2015    Procedure: URETEROSCOPY WITH HOLMIUM LASER LITHOTRIPSY;  Surgeon: Dustin Gimenez, MD;  Location: [facility] ORS;  Service: Urology;  Laterality: Left;      Allergies:  Allergies  Allergen Reactions   Onion Swelling   Coconut (Cocos Nucifera) Itching   Adhesive [Tape] Other (See Comments)    Steri-Strips & Silk tape   Tape Rash    Reaction to Paper tape. Pt denies reaction to tegaderm or surgical tape.    Family History: Family History  Problem Relation Age of Onset   Depression Mother    Diabetes Mellitus II Mother        Pre Diabetes   Congestive Heart Failure Mother    Depression Father    Hypertension Father    Breast cancer Paternal Grandmother        not sure of age    Social History:  reports that she quit smoking about 16 months ago. Her smoking use included cigarettes. She has never used smokeless tobacco. She reports that she does not currently use alcohol. She reports that she does not currently use drugs after having used the following drugs: Marijuana.  ROS: Negative aside from those stated in the HPI.  Physical Exam: BP 103/64 (BP Location: Left Arm)   Pulse 80   Temp 98 F (36.7 C)   Resp 17   Ht 5\' 2"  (1.575 m)   Wt 96  kg   SpO2 98%   BMI 38.71 kg/m    Constitutional:  Alert and oriented, No acute distress. Cardiovascular: Regular rate and rhythm Respiratory: Clear to auscultation bilaterally GI: Abdomen is soft, nontender, nondistended, no abdominal masses   Laboratory Data: Reviewed in epic Lactate 0.7 Urinalysis 0-5 squamous cells, few bacteria, greater than 50 RBC, greater than 50 WBC Renal function normal, creatinine 0.66  Pertinent Imaging: I have personally reviewed the CT scan showing a 3 mm left distal ureteral stone with upstream hydronephrosis.  Assessment & Plan:   28 year old female with 3 mm left distal ureteral stone and ongoing renal colic requiring admission to the hospital.  She does not have any fever or UTI symptoms,  urinalysis though equivocal with greater than 50 RBC, greater than 50 WBC, few bacteria, leukocytosis to 18k, normal lactate.  We discussed possibility of UTI, no signs of sepsis from urinary source at this time.  We discussed treatment options including medical expulsive therapy, stent placement for drainage, or definitive treatment with ureteroscopy, laser lithotripsy/basket extraction of stone, and stent placement.  She does not feel comfortable with discharge or medical expulsive therapy and is interested in more aggressive treatment.  We discussed the possibility of staged procedure with stent followed by ureteroscopy and laser lithotripsy 2 to 3 weeks later if any evidence of infection intraoperatively, or if she spikes any fevers today.  Will follow-up a repeat CBC this morning as well.  With her lack of insurance, she understandably has strong preference for 1 procedure instead of staged procedures with multiple hospitalizations and multiple anesthesia charges.  Will try to manage her stone today with ureteroscopy and stone removal if safe.  We specifically discussed the risks ureteroscopy including bleeding, infection/sepsis, stent related symptoms including flank pain/urgency/frequency/incontinence/dysuria, ureteral injury, ureteral stricture, inability to access stone, or need for staged or additional procedures.   Recommendations:  -Continue antibiotics, will follow-up morning CBC -Left ureteroscopy, laser lithotripsy, stent placement today-> we discussed at length possible need for stent placement alone with delayed treatment if any evidence of infection intraoperatively or fever today, or rising leukocytosis  Lawerence Pressman, MD   [facility] [address] [place], [place] [address] [phone]

## 2023-09-02 NOTE — Assessment & Plan Note (Signed)
 Continue Zoloft

## 2023-09-02 NOTE — Hospital Course (Addendum)
 Taken from H&P  Suzanne Nelson is a 28 y.o. female with medical history significant of Anxiety, asthma, kidney stone in 2016 being admitted with an obstructing ureteral stone and UTI.  She presented with left flank pain and nausea and vomiting that started on the day of arrival.    On presentation stable vitals, labs pertinent for hematuria, leukocytosis at 18,000,.CT renal stone study showing 3 mm distal left ureteral stone with mild left hydronephrosis and hydroureter.  Patient was started on ceftriaxone  and urology was consulted.  5/16: Patient was taken to the OR by urology s/p cystoscopy with stone removal and stent placement.  Preliminary blood cultures negative and urine cultures pending.  5/17: Remained hemodynamically stable.  Pain much improved with current pain regimen.  Urine culture still pending.  Patient is being discharged on 5 days of Keflex , she was also given some Percocet and oxybutynin  to use as needed.  She will continue with her current medications and need to have a close follow-up with her providers for further assistance.

## 2023-09-02 NOTE — Op Note (Signed)
 Date of procedure: 09/02/23  Preoperative diagnosis:  Left ureteral stone  Postoperative diagnosis:  Same  Procedure: Cystoscopy, left ureteroscopy, left retrograde pyelogram with intraoperative interpretation, laser lithotripsy, left ureteral stent placement  Surgeon: Jay Meth, MD  Anesthesia: General  Complications: None  Intraoperative findings:  Normal bladder, no evidence of purulent urine Left distal ureteral stone dusted and fragments irrigated free, uncomplicated stent placement  EBL: Minimal  Specimens: None  Drains: Left 6 French by 24 cm ureteral stent with Dangler  Indication: Suzanne Nelson is a 28 y.o. patient with 3 mm left distal ureteral stone and poorly controlled renal colic admitted overnight.  Urinalysis was equivocal but with few bacteria, lactate was normal, she was afebrile with no UTI symptoms.  We discussed options including medical expulsive therapy, stent placement, or definitive treatment with ureteroscopy, and she opted for ureteroscopy.  She understood the risk of possible intraoperative evidence of infection that would necessitate stent placement and delayed procedure.    After reviewing the management options for treatment, they elected to proceed with the above surgical procedure(s). We have discussed the potential benefits and risks of the procedure, side effects of the proposed treatment, the likelihood of the patient achieving the goals of the procedure, and any potential problems that might occur during the procedure or recuperation. Informed consent has been obtained.  Description of procedure:  The patient was taken to the operating room and general anesthesia was induced. SCDs were placed for DVT prophylaxis. The patient was placed in the dorsal lithotomy position, prepped and draped in the usual sterile fashion, and preoperative antibiotics(ceftriaxone ) were administered. A preoperative time-out was performed.   A 21 French rigid  cystoscope was used to intubate the urethra and thorough cystoscopy was performed.  The bladder was grossly normal.  A sensor wire was advanced into the left ureteral orifice and advanced easily up to the kidney under fluoroscopic vision.  There was no evidence of any purulent drainage alongside the wire.  A semirigid short ureteroscope with minimal pressure was advanced alongside the wire and there was a yellow stone lodged in the distal ureter.  A 200 m laser fiber on settings of 1.0 J and 10 Hz was used to carefully fragment the stone into multiple small pieces, which were all irrigated free from the ureter.  Minimal irrigation pressure was used throughout.  The scope was advanced into the proximal ureter and a gentle retrograde pyelogram showed mild hydronephrosis but no filling defects or extravasation.  Pullback ureteroscopy showed no evidence of ureteral injury or residual fragments.  A 6 French by 24 cm ureteral stent was placed fluoroscopically with a curl in the renal pelvis, as well as in the bladder.  The bladder was drained.  The Dangler was secured to the suprapubic region with Mastisol and Tegaderm.  Disposition: Stable to PACU  Plan: Can remove stent at home on 09/07/23 Follow-up with urology as needed  Jay Meth, MD

## 2023-09-02 NOTE — H&P (Signed)
 History and Physical    Patient: Suzanne Nelson [id number]:[id number] DOB: Aug 25, 1995 DOA: 09/01/2023 DOS: the patient was seen and examined on 09/02/2023 PCP: [facility]  Patient coming from: Home  Chief Complaint:  Chief Complaint  Patient presents with   Flank Pain   HPI: Suzanne Nelson is a 28 y.o. female with medical history significant of Anxiety, asthma, kidney stone in 2016 being admitted with an obstructing ureteral stone and UTI.  She presented with left flank pain and nausea and vomiting that started on the day of arrival.  She denies fever, diarrhea.  Has no cough, chest pain. ED course and data review: Afebrile with otherwise normal vitals  Labs notable for hematuria WBC 18,000 with lactic acid 0.7 Lipase and LFTs WNL Hemoglobin normal BMP unremarkable Urine pregnancy test negative   CT renal stone study showing 3 mm distal left ureteral stone with mild left hydronephrosis and hydroureter  The ED provider spoke with urologist, Dr. Leandro Proffer who will take patient for cystoscopy in the a.m.  Patient treated with ceftriaxone  and NS bolus and morphine   Hospitalist consulted for admission.  Review of Systems: As mentioned in the history of present illness. All other systems reviewed and are negative. Past Medical History:  Diagnosis Date   ADHD (attention deficit hyperactivity disorder)    Anemia    Anxiety    Anxiety    Asthma    Asthma    BV (bacterial vaginosis) 10/19/2019   Noted after NOB. Rx for Metrogel    Calculus of distal left ureter 05/18/2015   Depression    Eating disorder 08/17/2022   Encounter for care or examination of lactating mother 05/18/2020   History of eating disorder 08/17/2022   Hydronephrosis 05/18/2015   Left ureteral stone 05/18/2015   Migraine    No pertinent past medical history    Obesity during pregnancy, antepartum 08/17/2022   Panic attack    Pneumonia    Pyelonephritis affecting pregnancy in third  trimester    Spotting affecting pregnancy in third trimester 01/22/2023   Subchorionic hemorrhage in first trimester 08/02/2022   Seen on 4/11 US      Past Surgical History:  Procedure Laterality Date   CESAREAN SECTION N/A 03/11/2023   Procedure: CESAREAN SECTION;  Surgeon: Vita Grip, MD;  Location: [facility] ORS;  Service: Obstetrics;  Laterality: N/A;   CYSTO N/A 05/19/2015   Procedure: CYSTO;  Surgeon: Dustin Gimenez, MD;  Location: [facility] ORS;  Service: Urology;  Laterality: N/A;   STENT PLACE LEFT URETER ([facility] HX)     URETEROSCOPY WITH HOLMIUM LASER LITHOTRIPSY Left 05/19/2015   Procedure: URETEROSCOPY WITH HOLMIUM LASER LITHOTRIPSY;  Surgeon: Dustin Gimenez, MD;  Location: [facility] ORS;  Service: Urology;  Laterality: Left;   Social History:  reports that she quit smoking about 16 months ago. Her smoking use included cigarettes. She has never used smokeless tobacco. She reports that she does not currently use alcohol. She reports that she does not currently use drugs after having used the following drugs: Marijuana.  Allergies  Allergen Reactions   Onion Swelling   Coconut (Cocos Nucifera) Itching   Adhesive [Tape] Other (See Comments)    Steri-Strips & Silk tape   Tape Rash    Reaction to Paper tape. Pt denies reaction to tegaderm or surgical tape.    Family History  Problem Relation Age of Onset   Depression Mother    Diabetes Mellitus II Mother        Pre Diabetes  Congestive Heart Failure Mother    Depression Father    Hypertension Father    Breast cancer Paternal Grandmother        not sure of age    Prior to Admission medications   Medication Sig Start Date End Date Taking? Authorizing Provider  acetaminophen  (TYLENOL ) 325 MG tablet Take 2 tablets (650 mg total) by mouth every 6 (six) hours as needed for mild pain (pain score 1-3) or fever. 03/13/23  Yes Wilson, Prudy Brownie, CNM  ibuprofen  (ADVIL ) 200 MG tablet Take 200 mg by mouth every 6 (six) hours as  needed.   Yes [provider]  ibuprofen  (ADVIL ) 600 MG tablet Take 1 tablet (600 mg total) by mouth every 6 (six) hours as needed for cramping, mild pain (pain score 1-3) or fever. Patient not taking: Reported on 09/01/2023 03/13/23   Auston Left, CNM  Iron , Ferrous Sulfate , 325 (65 Fe) MG TABS Take 1 tablet by mouth daily at 6 (six) AM. Patient not taking: Reported on 09/01/2023 03/09/23   Santana Cue, MD  Prenatal Vit-Fe Fumarate-FA (MULTIVITAMIN-PRENATAL) 27-0.8 MG TABS tablet Take 1 tablet by mouth in the morning. Patient not taking: Reported on 09/01/2023    [provider]  senna (SENOKOT) 8.6 MG TABS tablet Take 1 tablet (8.6 mg total) by mouth at bedtime as needed for mild constipation. Patient not taking: Reported on 09/01/2023 03/13/23   Auston Left, CNM  sertraline  (ZOLOFT ) 50 MG tablet Take 1 tablet (50 mg total) by mouth daily. Patient not taking: Reported on 09/01/2023 12/02/22   Streilein, Annamarie, PA-C  simethicone  (MYLICON) 80 MG chewable tablet Chew 1 tablet (80 mg total) by mouth 4 (four) times daily as needed for flatulence. Patient not taking: Reported on 09/01/2023 03/13/23   Auston Left, CNM  albuterol  (PROVENTIL  HFA;VENTOLIN  HFA) 108 (90 Base) MCG/ACT inhaler Inhale 2 puffs into the lungs every 6 (six) hours as needed for wheezing or shortness of breath. 05/22/18 10/28/19  Driscilla George, PA-C    Physical Exam: Vitals:   09/01/23 2145 09/02/23 0124 09/02/23 0150 09/02/23 0206  BP: 114/73 104/69 115/87   Pulse: 80 77 76   Resp: 16 16 16    Temp:  98.9 F (37.2 C) (!) 97.5 F (36.4 C)   TempSrc:  Oral Oral   SpO2:  98% 99%   Weight:    96 kg  Height:    5\' 2"  (1.575 m)   Physical Exam Vitals and nursing note reviewed.  Constitutional:      General: She is not in acute distress. HENT:     Head: Normocephalic and atraumatic.  Cardiovascular:     Rate and Rhythm: Normal rate and regular rhythm.     Heart sounds:  Normal heart sounds.  Pulmonary:     Effort: Pulmonary effort is normal.     Breath sounds: Normal breath sounds.  Abdominal:     Palpations: Abdomen is soft.     Tenderness: There is no abdominal tenderness.  Neurological:     Mental Status: Mental status is at baseline.     Data Reviewed: Relevant notes from primary care and specialist visits, past discharge summaries as available in EHR, including Care Everywhere. Prior diagnostic testing as pertinent to current admission diagnoses Updated medications and problem lists for reconciliation ED course, including vitals, labs, imaging, treatment and response to treatment Triage notes, nursing and pharmacy notes and ED provider's notes Notable results as noted in HPI   Assessment and Plan: *  Hydroureteronephrosis with urinary obstruction due to distal ureteral calculus, left Renal colic Complicated urinary tract infection N.p.o. for cystoscopy in the a.m Rocephin  Pain control Urology was consulted from the ED  Anxiety Continue Zoloft   Asthma Albuterol  as needed      Advance Care Planning:   Code Status: Full Code   Consults: urology, Dr Estanislao Heimlich  Family Communication:   Severity of Illness: The appropriate patient status for this patient is OBSERVATION. Observation status is judged to be reasonable and necessary in order to provide the required intensity of service to ensure the patient's safety. The patient's presenting symptoms, physical exam findings, and initial radiographic and laboratory data in the context of their medical condition is felt to place them at decreased risk for further clinical deterioration. Furthermore, it is anticipated that the patient will be medically stable for discharge from the hospital within 2 midnights of admission.   Author: Lanetta Pion, MD 09/02/2023 3:32 AM  For on call review [email or web address].

## 2023-09-02 NOTE — Anesthesia Procedure Notes (Signed)
 Procedure Name: Intubation Date/Time: 09/02/2023 9:58 AM  Performed by: Philippe Brazen, CRNAPre-anesthesia Checklist: Patient identified, Emergency Drugs available, Suction available and Patient being monitored Patient Re-evaluated:Patient Re-evaluated prior to induction Oxygen Delivery Method: Circle system utilized Preoxygenation: Pre-oxygenation with 100% oxygen Induction Type: IV induction Ventilation: Mask ventilation without difficulty Laryngoscope Size: McGrath and 3 Grade View: Grade I Tube type: Oral Number of attempts: 1 Airway Equipment and Method: Stylet and Oral airway Placement Confirmation: ETT inserted through vocal cords under direct vision, positive ETCO2 and breath sounds checked- equal and bilateral Secured at: 20 cm Tube secured with: Tape Dental Injury: Teeth and Oropharynx as per pre-operative assessment

## 2023-09-02 NOTE — Anesthesia Preprocedure Evaluation (Signed)
 Anesthesia Evaluation  Patient identified by MRN, date of birth, ID band Patient awake    Reviewed: Allergy & Precautions, H&P , NPO status , Patient's Chart, lab work & pertinent test results, reviewed documented beta blocker date and time   History of Anesthesia Complications Negative for: history of anesthetic complications  Airway Mallampati: II  TM Distance: >3 FB Neck ROM: full    Dental  (+) Teeth Intact, Dental Advidsory Given   Pulmonary neg pulmonary ROS, neg shortness of breath, asthma , neg sleep apnea, neg COPD, neg recent URI, former smoker   Pulmonary exam normal breath sounds clear to auscultation       Cardiovascular Exercise Tolerance: Good negative cardio ROS Normal cardiovascular exam Rhythm:regular Rate:Normal     Neuro/Psych  Headaches, neg Seizures PSYCHIATRIC DISORDERS (Depression and anxiety) Anxiety Depression    negative neurological ROS  negative psych ROS   GI/Hepatic negative GI ROS, Neg liver ROS,GERD  Poorly Controlled,,  Endo/Other  negative endocrine ROSneg diabetes    Renal/GU      Musculoskeletal   Abdominal   Peds  Hematology negative hematology ROS (+) Blood dyscrasia, anemia   Anesthesia Other Findings Past Medical History: No date: ADHD (attention deficit hyperactivity disorder) No date: Anemia No date: Anxiety No date: Anxiety No date: Asthma No date: Asthma 10/19/2019: BV (bacterial vaginosis)     Comment:  Noted after NOB. Rx for Metrogel  05/18/2015: Calculus of distal left ureter No date: Depression 08/17/2022: Eating disorder 05/18/2020: Encounter for care or examination of lactating mother 08/17/2022: History of eating disorder 05/18/2015: Hydronephrosis 05/18/2015: Left ureteral stone No date: Migraine No date: No pertinent past medical history 08/17/2022: Obesity during pregnancy, antepartum No date: Panic attack No date: Pneumonia No date: Pyelonephritis  affecting pregnancy in third trimester 01/22/2023: Spotting affecting pregnancy in third trimester 08/02/2022: Subchorionic hemorrhage in first trimester     Comment:  Seen on 4/11 US     Past Surgical History: 03/11/2023: CESAREAN SECTION; N/A     Comment:  Procedure: CESAREAN SECTION;  Surgeon: Vita Grip, MD;  Location: [facility] ORS;  Service: Obstetrics;              Laterality: N/A; 05/19/2015: Adonica Ala; N/A     Comment:  Procedure: CYSTO;  Surgeon: Dustin Gimenez, MD;                Location: [facility] ORS;  Service: Urology;  Laterality: N/A; No date: STENT PLACE LEFT URETER ([facility] HX) 05/19/2015: URETEROSCOPY WITH HOLMIUM LASER LITHOTRIPSY; Left     Comment:  Procedure: URETEROSCOPY WITH HOLMIUM LASER LITHOTRIPSY;               Surgeon: Dustin Gimenez, MD;  Location: [facility] ORS;                Service: Urology;  Laterality: Left;  BMI    Body Mass Index: 36.58 kg/m      Reproductive/Obstetrics negative OB ROS (+) Pregnancy                             Anesthesia Physical Anesthesia Plan  ASA: 2  Anesthesia Plan: General ETT   Post-op Pain Management:    Induction: Intravenous and Rapid sequence  PONV Risk Score and Plan: 3 and Ondansetron , Dexamethasone  and Midazolam   Airway Management Planned: Oral ETT  Additional Equipment:   Intra-op Plan:   Post-operative  Plan: Extubation in OR  Informed Consent: I have reviewed the patients History and Physical, chart, labs and discussed the procedure including the risks, benefits and alternatives for the proposed anesthesia with the patient or authorized representative who has indicated his/her understanding and acceptance.     Dental Advisory Given  Plan Discussed with: Anesthesiologist, CRNA and Surgeon  Anesthesia Plan Comments: (Patient consented for risks of anesthesia including but not limited to:  - adverse reactions to medications - damage to eyes, teeth, lips or other  oral mucosa - nerve damage due to positioning  - sore throat or hoarseness - Damage to heart, brain, nerves, lungs, other parts of body or loss of life  Patient voiced understanding and assent.)       Anesthesia Quick Evaluation

## 2023-09-02 NOTE — Plan of Care (Signed)

## 2023-09-02 NOTE — Progress Notes (Signed)
  Progress Note   Patient: Suzanne Nelson XBJ:[id number] DOB: 1995-08-22 DOA: 09/01/2023     0 DOS: the patient was seen and examined on 09/02/2023   Brief hospital course: Taken from H&P  Suzanne Nelson is a 28 y.o. female with medical history significant of Anxiety, asthma, kidney stone in 2016 being admitted with an obstructing ureteral stone and UTI.  She presented with left flank pain and nausea and vomiting that started on the day of arrival.    On presentation stable vitals, labs pertinent for hematuria, leukocytosis at 18,000,.CT renal stone study showing 3 mm distal left ureteral stone with mild left hydronephrosis and hydroureter.  Patient was started on ceftriaxone  and urology was consulted.  5/16: Patient was taken to the OR by urology s/p cystoscopy with stone removal and stent placement.  Preliminary blood cultures negative and urine cultures pending.  Assessment and Plan: * Hydroureteronephrosis with urinary obstruction due to distal ureteral calculus, left Renal colic Complicated urinary tract infection S/p cystoscopy with stone removal and ureteral stent placement Continue with Rocephin  Continue with pain control  Anxiety Continue Zoloft   Asthma Albuterol  as needed   Subjective: Patient was seen after the procedure.  Still having significant dysuria and bladder spasms.  Physical Exam: Vitals:   09/02/23 1045 09/02/23 1057 09/02/23 1100 09/02/23 1317  BP: 116/63  121/77 123/78  Pulse: 79 77 97 84  Resp: 15 16 20 17   Temp:   (!) 97.4 F (36.3 C) 97.9 F (36.6 C)  TempSrc:      SpO2: 93% 98% 96% 95%  Weight:      Height:       General.  Obese lady, in no acute distress. Pulmonary.  Lungs clear bilaterally, normal respiratory effort. CV.  Regular rate and rhythm, no JVD, rub or murmur. Abdomen.  Soft, nontender, nondistended, BS positive. CNS.  Alert and oriented .  No focal neurologic deficit. Extremities.  No edema, no cyanosis, pulses intact and  symmetrical. Psychiatry.  Judgment and insight appears normal.   Data Reviewed: Prior data reviewed  Family Communication: Discussed with patient  Disposition: Status is: Inpatient Remains inpatient appropriate because: Severity of illness  Planned Discharge Destination: Home  Time spent:  minutes  This record has been created using Conservation officer, historic buildings. Errors have been sought and corrected,but may not always be located. Such creation errors do not reflect on the standard of care.   Author: Luna Salinas, MD 09/02/2023 3:19 PM  For on call review [email or web address].

## 2023-09-02 NOTE — Assessment & Plan Note (Addendum)
 Renal colic Complicated urinary tract infection S/p cystoscopy with stone removal and ureteral stent placement Continue with Rocephin  Continue with pain control

## 2023-09-02 NOTE — Transfer of Care (Signed)
 Immediate Anesthesia Transfer of Care Note  Patient: Suzanne Nelson  Procedure(s) Performed: CYSTOSCOPY/URETEROSCOPY/HOLMIUM LASER/STENT PLACEMENT (Left) CYSTOSCOPY, WITH RETROGRADE PYELOGRAM  Patient Location: PACU  Anesthesia Type:General  Level of Consciousness: awake and alert   Airway & Oxygen Therapy: Patient Spontanous Breathing  Post-op Assessment: Report given to RN and Post -op Vital signs reviewed and stable  Post vital signs: Reviewed and stable  Last Vitals:  Vitals Value Taken Time  BP 123/82 09/02/23 1030  Temp 36.1 C 09/02/23 1029  Pulse 90 09/02/23 1032  Resp 17 09/02/23 1032  SpO2 96 % 09/02/23 1032  Vitals shown include unfiled device data.  Last Pain:  Vitals:   09/02/23 0911  TempSrc: Temporal  PainSc: 5       Patients Stated Pain Goal: 0 (09/02/23 0911)  Complications: No notable events documented.

## 2023-09-02 NOTE — Assessment & Plan Note (Signed)
 Albuterol as needed

## 2023-09-02 NOTE — Consult Note (Signed)
 Urology Consult   I have been asked to see the patient by Dr. Vallarie Gauze, for evaluation and management of left ureteral stone, possible UTI.  Chief Complaint: Left flank pain  HPI:  Suzanne Nelson is a 28 y.o. female with history of stones in 2017 requiring ureteroscopy who presented to the ER overnight with severe left-sided flank pain for 2 to 3 days.  She denies any UTI symptoms, fevers or chills.  She had leukocytosis to 18k and equivocal urinalysis, was started on antibiotics and admitted to the hospital for pain control.  She recently lost her insurance.  She has been unable to care for her kids with her pain at home.  PMH: Past Medical History:  Diagnosis Date   ADHD (attention deficit hyperactivity disorder)    Anemia    Anxiety    Anxiety    Asthma    Asthma    BV (bacterial vaginosis) 10/19/2019   Noted after NOB. Rx for Metrogel    Calculus of distal left ureter 05/18/2015   Depression    Eating disorder 08/17/2022   Encounter for care or examination of lactating mother 05/18/2020   History of eating disorder 08/17/2022   Hydronephrosis 05/18/2015   Left ureteral stone 05/18/2015   Migraine    No pertinent past medical history    Obesity during pregnancy, antepartum 08/17/2022   Panic attack    Pneumonia    Pyelonephritis affecting pregnancy in third trimester    Spotting affecting pregnancy in third trimester 01/22/2023   Subchorionic hemorrhage in first trimester 08/02/2022   Seen on 4/11 US       Surgical History: Past Surgical History:  Procedure Laterality Date   CESAREAN SECTION N/A 03/11/2023   Procedure: CESAREAN SECTION;  Surgeon: Vita Grip, MD;  Location: [facility] ORS;  Service: Obstetrics;  Laterality: N/A;   CYSTO N/A 05/19/2015   Procedure: CYSTO;  Surgeon: Dustin Gimenez, MD;  Location: [facility] ORS;  Service: Urology;  Laterality: N/A;   STENT PLACE LEFT URETER ([facility] HX)     URETEROSCOPY WITH HOLMIUM LASER LITHOTRIPSY Left 05/19/2015    Procedure: URETEROSCOPY WITH HOLMIUM LASER LITHOTRIPSY;  Surgeon: Dustin Gimenez, MD;  Location: [facility] ORS;  Service: Urology;  Laterality: Left;      Allergies:  Allergies  Allergen Reactions   Onion Swelling   Coconut (Cocos Nucifera) Itching   Adhesive [Tape] Other (See Comments)    Steri-Strips & Silk tape   Tape Rash    Reaction to Paper tape. Pt denies reaction to tegaderm or surgical tape.    Family History: Family History  Problem Relation Age of Onset   Depression Mother    Diabetes Mellitus II Mother        Pre Diabetes   Congestive Heart Failure Mother    Depression Father    Hypertension Father    Breast cancer Paternal Grandmother        not sure of age    Social History:  reports that she quit smoking about 16 months ago. Her smoking use included cigarettes. She has never used smokeless tobacco. She reports that she does not currently use alcohol. She reports that she does not currently use drugs after having used the following drugs: Marijuana.  ROS: Negative aside from those stated in the HPI.  Physical Exam: BP 103/64 (BP Location: Left Arm)   Pulse 80   Temp 98 F (36.7 C)   Resp 17   Ht 5\' 2"  (1.575 m)   Wt 96  kg   SpO2 98%   BMI 38.71 kg/m    Constitutional:  Alert and oriented, No acute distress. Cardiovascular: Regular rate and rhythm Respiratory: Clear to auscultation bilaterally GI: Abdomen is soft, nontender, nondistended, no abdominal masses   Laboratory Data: Reviewed in epic Lactate 0.7 Urinalysis 0-5 squamous cells, few bacteria, greater than 50 RBC, greater than 50 WBC Renal function normal, creatinine 0.66  Pertinent Imaging: I have personally reviewed the CT scan showing a 3 mm left distal ureteral stone with upstream hydronephrosis.  Assessment & Plan:   28 year old female with 3 mm left distal ureteral stone and ongoing renal colic requiring admission to the hospital.  She does not have any fever or UTI symptoms,  urinalysis though equivocal with greater than 50 RBC, greater than 50 WBC, few bacteria, leukocytosis to 18k, normal lactate.  We discussed possibility of UTI, no signs of sepsis from urinary source at this time.  We discussed treatment options including medical expulsive therapy, stent placement for drainage, or definitive treatment with ureteroscopy, laser lithotripsy/basket extraction of stone, and stent placement.  She does not feel comfortable with discharge or medical expulsive therapy and is interested in more aggressive treatment.  We discussed the possibility of staged procedure with stent followed by ureteroscopy and laser lithotripsy 2 to 3 weeks later if any evidence of infection intraoperatively, or if she spikes any fevers today.  Will follow-up a repeat CBC this morning as well.  With her lack of insurance, she understandably has strong preference for 1 procedure instead of staged procedures with multiple hospitalizations and multiple anesthesia charges.  Will try to manage her stone today with ureteroscopy and stone removal if safe.  We specifically discussed the risks ureteroscopy including bleeding, infection/sepsis, stent related symptoms including flank pain/urgency/frequency/incontinence/dysuria, ureteral injury, ureteral stricture, inability to access stone, or need for staged or additional procedures.   Recommendations:  -Continue antibiotics, will follow-up morning CBC -Left ureteroscopy, laser lithotripsy, stent placement today-> we discussed at length possible need for stent placement alone with delayed treatment if any evidence of infection intraoperatively or fever today, or rising leukocytosis  Lawerence Pressman, MD   [facility] [address] [place], [place] [address] [phone]

## 2023-09-02 NOTE — Interval H&P Note (Signed)
 UROLOGY H&P UPDATE  Agree with prior H&P dated 09/02/2023.  Ongoing renal colic, 3 mm left distal ureteral stone, urinalysis with pyuria but few bacteria, no fever or UTI symptoms.  We discussed at length possible need for staged procedure if any evidence of intraoperative infection.  Cardiac: RRR Lungs: CTA bilaterally  Laterality: Left Procedure: Left ureteroscopy, laser lithotripsy, stent placement  We specifically discussed the risks ureteroscopy including bleeding, infection/sepsis, stent related symptoms including flank pain/urgency/frequency/incontinence/dysuria, ureteral injury, ureteral stricture, inability to access stone, or need for staged or additional procedures.   Suzanne Pressman, MD 09/02/2023

## 2023-09-03 DIAGNOSIS — N132 Hydronephrosis with renal and ureteral calculous obstruction: Secondary | ICD-10-CM | POA: Diagnosis not present

## 2023-09-03 DIAGNOSIS — N390 Urinary tract infection, site not specified: Secondary | ICD-10-CM | POA: Diagnosis not present

## 2023-09-03 DIAGNOSIS — F419 Anxiety disorder, unspecified: Secondary | ICD-10-CM | POA: Diagnosis not present

## 2023-09-03 MED ORDER — CEPHALEXIN 750 MG PO CAPS
750.0000 mg | ORAL_CAPSULE | Freq: Three times a day (TID) | ORAL | 0 refills | Status: AC
Start: 2023-09-03 — End: 2023-09-08

## 2023-09-03 MED ORDER — OXYBUTYNIN CHLORIDE 5 MG PO TABS: 5.0000 mg | ORAL_TABLET | Freq: Three times a day (TID) | ORAL | 0 refills | Status: AC

## 2023-09-03 MED ORDER — HYDROCODONE-ACETAMINOPHEN 5-325 MG PO TABS: 1.0000 | ORAL_TABLET | ORAL | 0 refills | Status: AC | PRN

## 2023-09-03 NOTE — TOC Initial Note (Signed)
 Transition of Care [facility]) - Initial/Assessment Note    Patient Details  Name: Suzanne Nelson MRN: [id number] Date of Birth: [birth year]  [facility]) CM/SW Contact:    Crayton Docker, RN 09/03/2023, 10:31 AM  Clinical Narrative:                  CM to patient's room regarding TOC screening. CM introduced case management role and discharge care planning process. Patient verbalized understanding and agreement with screening interview. Patient states lives with mother, 2 minor children and 1 dog--vaccinated.    Expected Discharge Plan: Home/Self Care Barriers to Discharge: No Barriers Identified   Patient Goals and CMS Choice      Home/self care    Expected Discharge Plan and Services    Home/self care   Living arrangements for the past 2 months: Apartment Expected Discharge Date: 09/03/23                   Prior Living Arrangements/Services Living arrangements for the past 2 months: Apartment Lives with:: Self, Relatives, Pets (1 dog--vaccinated) Patient language and need for interpreter reviewed:: No Do you feel safe going back to the place where you live?: Yes      Need for Family Participation in Patient Care: Yes (Comment) Care giver support system in place?: Yes (comment)   Criminal Activity/Legal Involvement Pertinent to Current Situation/Hospitalization: No - Comment as needed  Activities of Daily Living   ADL Screening (condition at time of admission) Independently performs ADLs?: Yes (appropriate for developmental age) Is the patient deaf or have difficulty hearing?: No Does the patient have difficulty seeing, even when wearing glasses/contacts?: No Does the patient have difficulty concentrating, remembering, or making decisions?: No  Permission Sought/Granted Permission sought to share information with : Case Manager, Family Supports Permission granted to share information with : Yes, Verbal Permission Granted  Share Information with NAME:  Lela Elliston/Nick Sweatt     Permission granted to share info w Relationship: Mother/Boyfriend  Permission granted to share info w Contact Information: yes  Emotional Assessment Appearance:: Appears stated age Attitude/Demeanor/Rapport: Engaged Affect (typically observed): Calm Orientation: : Oriented to Self, Oriented to Place, Oriented to  Time, Oriented to Situation Alcohol / Substance Use: Tobacco Use, Illicit Drugs (Last vape use Friday, Aug 26, 2023) Psych Involvement: No (comment)  Admission diagnosis:  Kidney stone [N20.0] Kidney stone on left side [N20.0] Urinary tract infection, acute [N39.0] Patient Active Problem List   Diagnosis Date Noted   Kidney stone on left side 09/02/2023   Asthma    Anxiety    [redacted] weeks gestation of pregnancy 03/11/2023   S/P repeat low transverse C-section 03/11/2023   Anemia, antepartum, third trimester 03/09/2023   Migraine 02/28/2023   Positive GBS test 02/17/2023   Pain and swelling of right lower leg 02/14/2023   Abnormal glucose tolerance test in pregnancy 12/16/22 1 hour glucola=140 12/17/2022   Complicated urinary tract infection 09/15/2022   Shingles in pregnancy diagnosed 09/02/22 [facility] 09/06/2022   Supervision of other normal pregnancy, antepartum 08/17/2022   Obesity during pregnancy, antepartum 08/17/2022   Smoking (tobacco) complicating pregnancy, unspecified trimester 08/17/2022   Migraine without aura, with intractable migraine, so stated, with status migrainosus 08/17/2022   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; +UDS MJ 09/10/22 08/17/2022   History of spontaneous abortion x 2 in the last year 07/21/2022   Hydroureteronephrosis with urinary obstruction due to distal ureteral calculus, left 05/18/2015   Major depressive disorder, single episode,  moderate (HCC) 03/16/2011   Attention deficit hyperactivity disorder, combined type 03/16/2011   Oppositional defiant disorder 03/16/2011   PCP:  Center, [employer]  [facility] Pharmacy:   [id number] [place], [place] - [address] AT [facility] OF [address] [place] [address] [place] [address] Phone: [phone] Fax: [phone]     Social Drivers of Health (SDOH) Social History: SDOH Screenings   Food Insecurity: Food Insecurity Present (09/02/2023)  Housing: Low Risk  (09/02/2023)  Transportation Needs: No Transportation Needs (09/02/2023)  Utilities: Not At Risk (09/02/2023)  Depression (PHQ2-9): Low Risk  (12/02/2022)  Social Connections: Unknown (09/02/2023)  Tobacco Use: Medium Risk (09/02/2023)   SDOH Interventions:     Readmission Risk Interventions     No data to display

## 2023-09-03 NOTE — Plan of Care (Signed)

## 2023-09-03 NOTE — Discharge Summary (Signed)
 Physician Discharge Summary   Patient: Suzanne Nelson MRN: [id number] DOB: 04-22-1995  Admit date:     09/01/2023  Discharge date: 09/03/23  Discharge Physician: Luna Salinas   PCP: Center, [facility]   Recommendations at discharge:  Please obtain CBC and BMP on follow-up Please follow-up on urine culture results Please ensure completion of antibiotics Follow-up with primary care provider Follow-up with urology  Discharge Diagnoses: Principal Problem:   Hydroureteronephrosis with urinary obstruction due to distal ureteral calculus, left Active Problems:   Complicated urinary tract infection   Asthma   Anxiety   Kidney stone on left side   Hospital Course: Taken from H&P  Suzanne Nelson is a 28 y.o. female with medical history significant of Anxiety, asthma, kidney stone in 2016 being admitted with an obstructing ureteral stone and UTI.  She presented with left flank pain and nausea and vomiting that started on the day of arrival.    On presentation stable vitals, labs pertinent for hematuria, leukocytosis at 18,000,.CT renal stone study showing 3 mm distal left ureteral stone with mild left hydronephrosis and hydroureter.  Patient was started on ceftriaxone  and urology was consulted.  5/16: Patient was taken to the OR by urology s/p cystoscopy with stone removal and stent placement.  Preliminary blood cultures negative and urine cultures pending.  5/17: Remained hemodynamically stable.  Pain much improved with current pain regimen.  Urine culture still pending.  Patient is being discharged on 5 days of Keflex , she was also given some Percocet and oxybutynin  to use as needed.  She will continue with her current medications and need to have a close follow-up with her providers for further assistance.  Assessment and Plan: * Hydroureteronephrosis with urinary obstruction due to distal ureteral calculus, left Renal colic Complicated urinary tract  infection S/p cystoscopy with stone removal and ureteral stent placement Received ceftriaxone  while in the hospital and is being discharged on Keflex  with pending urine culture results. Continue with pain control with as needed Percocet and oxybutynin   Anxiety Continue Zoloft   Asthma Albuterol  as needed  Pain control - [place]  Controlled Substance Reporting System database was reviewed. and patient was instructed, not to drive, operate heavy machinery, perform activities at heights, swimming or participation in water activities or provide baby-sitting services while on Pain, Sleep and Anxiety Medications; until their outpatient Physician has advised to do so again. Also recommended to not to take more than prescribed Pain, Sleep and Anxiety Medications.  Consultants: Urology Procedures performed: Cystoscopy with removal of ureteral stone and stent placement Disposition: Home Diet recommendation:  Discharge Diet Orders (From admission, onward)     Start     Ordered   09/03/23 0000  Diet - low sodium heart healthy        09/03/23 1011           Regular diet DISCHARGE MEDICATION: Allergies as of 09/03/2023       Reactions   Onion Swelling   Coconut (cocos Nucifera) Itching   Adhesive [tape] Other (See Comments)   Steri-Strips & Silk tape   Tape Rash   Reaction to Paper tape. Pt denies reaction to tegaderm or surgical tape.        Medication List     STOP taking these medications    senna 8.6 MG Tabs tablet Commonly known as: SENOKOT   sertraline  50 MG tablet Commonly known as: ZOLOFT    simethicone  80 MG chewable tablet Commonly known as: MYLICON  TAKE these medications    acetaminophen  325 MG tablet Commonly known as: TYLENOL  Take 2 tablets (650 mg total) by mouth every 6 (six) hours as needed for mild pain (pain score 1-3) or fever.   cephALEXin  750 MG capsule Commonly known as: Keflex  Take 1 capsule (750 mg total) by mouth 3 (three) times  daily for 5 days.   HYDROcodone -acetaminophen  5-325 MG tablet Commonly known as: NORCO/VICODIN Take 1-2 tablets by mouth every 4 (four) hours as needed for moderate pain (pain score 4-6).   ibuprofen  200 MG tablet Commonly known as: ADVIL  Take 200 mg by mouth every 6 (six) hours as needed. What changed: Another medication with the same name was removed. Continue taking this medication, and follow the directions you see here.   Iron  (Ferrous Sulfate ) 325 (65 Fe) MG Tabs Take 1 tablet by mouth daily at 6 (six) AM.   multivitamin-prenatal 27-0.8 MG Tabs tablet Take 1 tablet by mouth in the morning.   oxybutynin  5 MG tablet Commonly known as: DITROPAN  Take 1 tablet (5 mg total) by mouth 3 (three) times daily for 10 days.        Follow-up Information     Center, [facility] Follow up.   Why: Hospital follow up Contact information: [address] [place] [address] [phone]         Department, [facility] .   Contact information: [address] [place] [address] [phone]                Discharge Exam: Filed Weights   09/01/23 1808 09/02/23 0206 09/02/23 0911  Weight: 79.4 kg 96 kg 90.7 kg   General.  Well-developed lady, in no acute distress. Pulmonary.  Lungs clear bilaterally, normal respiratory effort. CV.  Regular rate and rhythm, no JVD, rub or murmur. Abdomen.  Soft, nontender, nondistended, BS positive. CNS.  Alert and oriented .  No focal neurologic deficit. Extremities.  No edema, no cyanosis, pulses intact and symmetrical. Psychiatry.  Judgment and insight appears normal.   Condition at discharge: stable  The results of significant diagnostics from this hospitalization (including imaging, microbiology, ancillary and laboratory) are listed below for reference.   Imaging Studies: DG OR UROLOGY CYSTO IMAGE ([facility] ONLY) Result Date: 09/02/2023 There is no interpretation for this exam.   This order is for images obtained during a surgical procedure.  Please See "Surgeries" Tab for more information regarding the procedure.   CT Renal Stone Study Result Date: 09/01/2023 CLINICAL DATA:  Left side flank pain EXAM: CT ABDOMEN AND PELVIS WITHOUT CONTRAST TECHNIQUE: Multidetector CT imaging of the abdomen and pelvis was performed following the standard protocol without IV contrast. RADIATION DOSE REDUCTION: This exam was performed according to the departmental dose-optimization program which includes automated exposure control, adjustment of the mA and/or kV according to patient size and/or use of iterative reconstruction technique. COMPARISON:  05/18/2015 FINDINGS: Lower chest: No acute findings. Hepatobiliary: No focal hepatic abnormality. Gallbladder unremarkable. Pancreas: No focal abnormality or ductal dilatation. Spleen: No focal abnormality.  Normal size. Adrenals/Urinary Tract: Adrenal glands normal. Left hydronephrosis and hydroureter due to 3 mm distal left ureteral stone. No stones or hydronephrosis on the right. Urinary bladder unremarkable. Stomach/Bowel: Normal appendix. Stomach, large and small bowel grossly unremarkable. Vascular/Lymphatic: No evidence of aneurysm or adenopathy. Reproductive: Uterus and adnexa unremarkable.  No mass. Other: No free fluid or free air. Musculoskeletal: No acute bony abnormality. IMPRESSION: 3 mm distal left ureteral stone with mild  left hydronephrosis and hydroureter. Electronically Signed   By: Janeece Mechanic M.D.   On: 09/01/2023 22:21    Microbiology: Results for orders placed or performed during the hospital encounter of 09/01/23  Blood culture (routine x 2)     Status: None (Preliminary result)   Collection Time: 09/01/23 11:34 PM   Specimen: BLOOD LEFT ARM  Result Value Ref Range Status   Specimen Description BLOOD LEFT ARM  Final   Special Requests   Final    BOTTLES DRAWN AEROBIC AND ANAEROBIC Blood Culture results may not be optimal due  to an inadequate volume of blood received in culture bottles   Culture   Final    NO GROWTH 1 DAY Performed at [facility], [address], [place], [place] [address]    Report Status PENDING  Incomplete  Blood culture (routine x 2)     Status: None (Preliminary result)   Collection Time: 09/01/23 11:35 PM   Specimen: BLOOD RIGHT ARM  Result Value Ref Range Status   Specimen Description BLOOD RIGHT ARM  Final   Special Requests   Final    BOTTLES DRAWN AEROBIC AND ANAEROBIC Blood Culture results may not be optimal due to an inadequate volume of blood received in culture bottles   Culture   Final    NO GROWTH 1 DAY Performed at [facility], [address] [place], [place] [address]    Report Status PENDING  Incomplete    Labs: CBC: Recent Labs  Lab 09/01/23 1810  WBC 18.5*  HGB 14.8  HCT 44.3  MCV 84.5  PLT 385   Basic Metabolic Panel: Recent Labs  Lab 09/01/23 1810  NA 138  K 3.8  CL 106  CO2 20*  GLUCOSE 116*  BUN 8  CREATININE 0.66  CALCIUM  9.0   Liver Function Tests: Recent Labs  Lab 09/01/23 1810  AST 40  ALT 60*  ALKPHOS 79  BILITOT 0.8  PROT 7.5  ALBUMIN 4.6   CBG: No results for input(s): "GLUCAP" in the last 168 hours.  Discharge time spent: greater than 30 minutes.  This record has been created using Conservation officer, historic buildings. Errors have been sought and corrected,but may not always be located. Such creation errors do not reflect on the standard of care.   Signed: Luna Salinas, MD [facility] 09/03/2023

## 2023-09-03 NOTE — TOC Transition Note (Signed)
 Transition of Care [facility]) - Discharge Note   Patient Details  Name: CERENITI CURB MRN: [id number] Date of Birth: 03-30-1996  Transition of Care [facility]) CM/SW Contact:  Crayton Docker, RN 09/03/2023, 10:34 AM   Clinical Narrative:     Discharge orders noted for home/self care. Per patient, patient, patient's mother, Lela will provide transportation at discharge.   Final next level of care: Home/Self Care Barriers to Discharge: No Barriers Identified   Patient Goals and CMS Choice    Home/self care   Discharge Placement       Home/self care         Discharge Plan and Services Additional resources added to the After Visit Summary for       Social Drivers of Health (SDOH) Interventions SDOH Screenings   Food Insecurity: Food Insecurity Present (09/02/2023)  Housing: Low Risk  (09/02/2023)  Transportation Needs: No Transportation Needs (09/02/2023)  Utilities: Not At Risk (09/02/2023)  Depression (PHQ2-9): Low Risk  (12/02/2022)  Social Connections: Unknown (09/02/2023)  Tobacco Use: Medium Risk (09/02/2023)     Readmission Risk Interventions     No data to display

## 2023-09-04 LAB — URINE CULTURE: Culture: >=100000 — AB

## 2023-09-05 NOTE — Anesthesia Postprocedure Evaluation (Signed)
 Anesthesia Post Note  Patient: Suzanne Nelson  Procedure(s) Performed: CYSTOSCOPY/URETEROSCOPY/HOLMIUM LASER/STENT PLACEMENT (Left) CYSTOSCOPY, WITH RETROGRADE PYELOGRAM  Patient location during evaluation: PACU Anesthesia Type: General Level of consciousness: awake and alert Pain management: pain level controlled Vital Signs Assessment: post-procedure vital signs reviewed and stable Respiratory status: spontaneous breathing, nonlabored ventilation, respiratory function stable and patient connected to nasal cannula oxygen Cardiovascular status: blood pressure returned to baseline and stable Postop Assessment: no apparent nausea or vomiting Anesthetic complications: no   No notable events documented.   Last Vitals:  Vitals:   09/03/23 0518 09/03/23 0840  BP: 94/65 113/78  Pulse: 80 86  Resp: 18 16  Temp: 36.8 C 36.6 C  SpO2: 99% 97%    Last Pain:  Vitals:   09/03/23 0946  TempSrc:   PainSc: 5                  Zula Hitch

## 2023-09-06 ENCOUNTER — Encounter: Payer: Self-pay | Admitting: Urology

## 2023-09-07 LAB — CULTURE, BLOOD (ROUTINE X 2)
Culture: NO GROWTH
Culture: NO GROWTH
